# Patient Record
Sex: Female | Born: 1935 | Race: Black or African American | Hispanic: No | Marital: Single | Smoking: Never smoker
Health system: Southern US, Community
[De-identification: ages and names within clinical notes are randomized; demographics above are authoritative.]

## PROBLEM LIST (undated history)

## (undated) DIAGNOSIS — J189 Pneumonia, unspecified organism: Secondary | ICD-10-CM

## (undated) DIAGNOSIS — N390 Urinary tract infection, site not specified: Secondary | ICD-10-CM

## (undated) DIAGNOSIS — I2699 Other pulmonary embolism without acute cor pulmonale: Secondary | ICD-10-CM

## (undated) DIAGNOSIS — I10 Essential (primary) hypertension: Secondary | ICD-10-CM

## (undated) DIAGNOSIS — K219 Gastro-esophageal reflux disease without esophagitis: Secondary | ICD-10-CM

## (undated) DIAGNOSIS — R600 Localized edema: Secondary | ICD-10-CM

## (undated) DIAGNOSIS — J45909 Unspecified asthma, uncomplicated: Secondary | ICD-10-CM

## (undated) DIAGNOSIS — F0390 Unspecified dementia without behavioral disturbance: Secondary | ICD-10-CM

## (undated) DIAGNOSIS — I82409 Acute embolism and thrombosis of unspecified deep veins of unspecified lower extremity: Secondary | ICD-10-CM

## (undated) DIAGNOSIS — M1990 Unspecified osteoarthritis, unspecified site: Secondary | ICD-10-CM

## (undated) DIAGNOSIS — N200 Calculus of kidney: Secondary | ICD-10-CM

## (undated) HISTORY — PX: KNEE ARTHROSCOPY: SHX127

## (undated) HISTORY — DX: Localized edema: R60.0

## (undated) HISTORY — PX: CARPAL TUNNEL RELEASE: SHX101

## (undated) HISTORY — PX: CHOLECYSTECTOMY: SHX55

## (undated) HISTORY — PX: HIP FRACTURE SURGERY: SHX118

## (undated) HISTORY — PX: DILATION AND CURETTAGE OF UTERUS: SHX78

## (undated) HISTORY — PX: FRACTURE SURGERY: SHX138

## (undated) HISTORY — DX: Essential (primary) hypertension: I10

## (undated) HISTORY — DX: Other pulmonary embolism without acute cor pulmonale: I26.99

## (undated) HISTORY — DX: Acute embolism and thrombosis of unspecified deep veins of unspecified lower extremity: I82.409

## (undated) HISTORY — PX: LITHOTRIPSY: SUR834

## (undated) HISTORY — DX: Unspecified dementia, unspecified severity, without behavioral disturbance, psychotic disturbance, mood disturbance, and anxiety: F03.90

## (undated) HISTORY — DX: Gastro-esophageal reflux disease without esophagitis: K21.9

---

## 1999-02-16 ENCOUNTER — Emergency Department (HOSPITAL_COMMUNITY): Admission: EM | Admit: 1999-02-16 | Discharge: 1999-02-16 | Payer: Self-pay | Admitting: Emergency Medicine

## 1999-02-16 ENCOUNTER — Encounter: Payer: Self-pay | Admitting: Emergency Medicine

## 1999-02-21 ENCOUNTER — Ambulatory Visit (HOSPITAL_COMMUNITY): Admission: RE | Admit: 1999-02-21 | Discharge: 1999-02-21 | Payer: Self-pay | Admitting: Urology

## 1999-12-26 ENCOUNTER — Encounter: Payer: Self-pay | Admitting: Emergency Medicine

## 1999-12-26 ENCOUNTER — Emergency Department (HOSPITAL_COMMUNITY): Admission: EM | Admit: 1999-12-26 | Discharge: 1999-12-26 | Payer: Self-pay | Admitting: Emergency Medicine

## 2002-07-08 ENCOUNTER — Emergency Department (HOSPITAL_COMMUNITY): Admission: EM | Admit: 2002-07-08 | Discharge: 2002-07-09 | Payer: Self-pay | Admitting: Emergency Medicine

## 2002-07-09 ENCOUNTER — Encounter: Payer: Self-pay | Admitting: Emergency Medicine

## 2003-09-06 ENCOUNTER — Ambulatory Visit (HOSPITAL_COMMUNITY): Admission: RE | Admit: 2003-09-06 | Discharge: 2003-09-06 | Payer: Self-pay | Admitting: Gastroenterology

## 2003-09-06 ENCOUNTER — Encounter: Payer: Self-pay | Admitting: Gastroenterology

## 2003-11-01 ENCOUNTER — Emergency Department (HOSPITAL_COMMUNITY): Admission: EM | Admit: 2003-11-01 | Discharge: 2003-11-01 | Payer: Self-pay | Admitting: Emergency Medicine

## 2004-02-22 ENCOUNTER — Ambulatory Visit (HOSPITAL_COMMUNITY): Admission: RE | Admit: 2004-02-22 | Discharge: 2004-02-22 | Payer: Self-pay | Admitting: Gastroenterology

## 2004-04-03 ENCOUNTER — Emergency Department (HOSPITAL_COMMUNITY): Admission: EM | Admit: 2004-04-03 | Discharge: 2004-04-03 | Payer: Self-pay | Admitting: Emergency Medicine

## 2004-08-29 ENCOUNTER — Emergency Department (HOSPITAL_COMMUNITY): Admission: EM | Admit: 2004-08-29 | Discharge: 2004-08-29 | Payer: Self-pay | Admitting: *Deleted

## 2004-12-04 ENCOUNTER — Ambulatory Visit (HOSPITAL_COMMUNITY): Admission: RE | Admit: 2004-12-04 | Discharge: 2004-12-04 | Payer: Self-pay | Admitting: Allergy

## 2005-01-03 ENCOUNTER — Ambulatory Visit: Payer: Self-pay | Admitting: Family Medicine

## 2005-01-16 ENCOUNTER — Inpatient Hospital Stay (HOSPITAL_COMMUNITY): Admission: EM | Admit: 2005-01-16 | Discharge: 2005-01-19 | Payer: Self-pay | Admitting: *Deleted

## 2005-01-16 ENCOUNTER — Ambulatory Visit: Payer: Self-pay | Admitting: Internal Medicine

## 2005-01-24 ENCOUNTER — Ambulatory Visit: Payer: Self-pay | Admitting: Family Medicine

## 2005-03-19 ENCOUNTER — Ambulatory Visit: Payer: Self-pay | Admitting: Internal Medicine

## 2005-03-19 ENCOUNTER — Inpatient Hospital Stay (HOSPITAL_COMMUNITY): Admission: EM | Admit: 2005-03-19 | Discharge: 2005-03-22 | Payer: Self-pay | Admitting: Emergency Medicine

## 2005-07-28 ENCOUNTER — Ambulatory Visit: Payer: Self-pay | Admitting: Family Medicine

## 2005-08-22 ENCOUNTER — Ambulatory Visit: Payer: Self-pay | Admitting: Family Medicine

## 2005-09-15 ENCOUNTER — Ambulatory Visit: Payer: Self-pay | Admitting: Family Medicine

## 2005-11-18 ENCOUNTER — Ambulatory Visit: Payer: Self-pay | Admitting: Family Medicine

## 2006-01-27 ENCOUNTER — Ambulatory Visit: Payer: Self-pay | Admitting: Internal Medicine

## 2006-03-02 ENCOUNTER — Ambulatory Visit: Payer: Self-pay | Admitting: Pulmonary Disease

## 2006-03-25 ENCOUNTER — Ambulatory Visit: Payer: Self-pay | Admitting: Family Medicine

## 2006-03-30 ENCOUNTER — Ambulatory Visit: Payer: Self-pay | Admitting: Internal Medicine

## 2006-04-24 ENCOUNTER — Ambulatory Visit: Payer: Self-pay | Admitting: Internal Medicine

## 2006-05-01 ENCOUNTER — Ambulatory Visit: Payer: Self-pay | Admitting: Internal Medicine

## 2006-06-12 ENCOUNTER — Ambulatory Visit: Payer: Self-pay | Admitting: Internal Medicine

## 2006-06-26 ENCOUNTER — Ambulatory Visit: Payer: Self-pay | Admitting: Internal Medicine

## 2006-07-10 ENCOUNTER — Ambulatory Visit: Payer: Self-pay | Admitting: Internal Medicine

## 2006-08-26 ENCOUNTER — Ambulatory Visit: Payer: Self-pay | Admitting: Internal Medicine

## 2006-10-06 ENCOUNTER — Ambulatory Visit: Payer: Self-pay | Admitting: Family Medicine

## 2006-10-16 ENCOUNTER — Ambulatory Visit: Payer: Self-pay | Admitting: Pulmonary Disease

## 2006-10-26 ENCOUNTER — Ambulatory Visit: Payer: Self-pay | Admitting: Internal Medicine

## 2006-10-26 ENCOUNTER — Ambulatory Visit: Payer: Self-pay | Admitting: Family Medicine

## 2006-10-26 LAB — CONVERTED CEMR LAB
ALT: 16 units/L (ref 0–40)
Albumin: 3.4 g/dL — ABNORMAL LOW (ref 3.5–5.2)
Alkaline Phosphatase: 88 units/L (ref 39–117)
BUN: 9 mg/dL (ref 6–23)
CO2: 32 meq/L (ref 19–32)
Calcium: 9.9 mg/dL (ref 8.4–10.5)
GFR calc non Af Amer: 51 mL/min
HCT: 42.8 % (ref 36.0–46.0)
Hemoglobin: 13.9 g/dL (ref 12.0–15.0)
Potassium: 3.5 meq/L (ref 3.5–5.1)
TSH: 2.63 microintl units/mL (ref 0.35–5.50)
Total Bilirubin: 1.3 mg/dL — ABNORMAL HIGH (ref 0.3–1.2)
Total Protein: 6.5 g/dL (ref 6.0–8.3)
WBC: 11 10*3/uL — ABNORMAL HIGH (ref 4.5–10.5)

## 2006-11-10 ENCOUNTER — Ambulatory Visit: Payer: Self-pay | Admitting: Pulmonary Disease

## 2007-01-05 ENCOUNTER — Ambulatory Visit: Payer: Self-pay | Admitting: Internal Medicine

## 2007-01-22 ENCOUNTER — Ambulatory Visit: Payer: Self-pay | Admitting: Internal Medicine

## 2007-03-19 ENCOUNTER — Ambulatory Visit: Payer: Self-pay | Admitting: Internal Medicine

## 2007-09-30 DIAGNOSIS — I10 Essential (primary) hypertension: Secondary | ICD-10-CM | POA: Insufficient documentation

## 2007-09-30 DIAGNOSIS — J45909 Unspecified asthma, uncomplicated: Secondary | ICD-10-CM

## 2007-09-30 DIAGNOSIS — M810 Age-related osteoporosis without current pathological fracture: Secondary | ICD-10-CM | POA: Insufficient documentation

## 2007-09-30 DIAGNOSIS — J209 Acute bronchitis, unspecified: Secondary | ICD-10-CM

## 2007-11-08 ENCOUNTER — Ambulatory Visit: Payer: Self-pay | Admitting: Family Medicine

## 2007-11-08 DIAGNOSIS — Z87442 Personal history of urinary calculi: Secondary | ICD-10-CM | POA: Insufficient documentation

## 2007-11-08 DIAGNOSIS — Z8711 Personal history of peptic ulcer disease: Secondary | ICD-10-CM

## 2007-11-08 DIAGNOSIS — K589 Irritable bowel syndrome without diarrhea: Secondary | ICD-10-CM

## 2007-11-08 DIAGNOSIS — Z9189 Other specified personal risk factors, not elsewhere classified: Secondary | ICD-10-CM | POA: Insufficient documentation

## 2007-12-20 ENCOUNTER — Ambulatory Visit: Payer: Self-pay | Admitting: Family Medicine

## 2007-12-20 DIAGNOSIS — K219 Gastro-esophageal reflux disease without esophagitis: Secondary | ICD-10-CM

## 2007-12-20 DIAGNOSIS — F068 Other specified mental disorders due to known physiological condition: Secondary | ICD-10-CM | POA: Insufficient documentation

## 2008-01-21 ENCOUNTER — Telehealth: Payer: Self-pay | Admitting: Family Medicine

## 2008-01-24 ENCOUNTER — Observation Stay (HOSPITAL_COMMUNITY): Admission: EM | Admit: 2008-01-24 | Discharge: 2008-01-27 | Payer: Self-pay | Admitting: Emergency Medicine

## 2008-01-25 ENCOUNTER — Ambulatory Visit: Payer: Self-pay | Admitting: Internal Medicine

## 2008-02-03 ENCOUNTER — Telehealth: Payer: Self-pay | Admitting: Family Medicine

## 2008-02-11 ENCOUNTER — Ambulatory Visit: Payer: Self-pay | Admitting: Family Medicine

## 2008-02-17 ENCOUNTER — Ambulatory Visit: Payer: Self-pay | Admitting: Internal Medicine

## 2008-03-24 ENCOUNTER — Inpatient Hospital Stay (HOSPITAL_COMMUNITY): Admission: EM | Admit: 2008-03-24 | Discharge: 2008-03-28 | Payer: Self-pay | Admitting: Emergency Medicine

## 2008-03-24 ENCOUNTER — Telehealth: Payer: Self-pay | Admitting: Family Medicine

## 2008-03-24 ENCOUNTER — Ambulatory Visit: Payer: Self-pay | Admitting: Internal Medicine

## 2008-03-31 ENCOUNTER — Ambulatory Visit: Payer: Self-pay | Admitting: Family Medicine

## 2008-03-31 DIAGNOSIS — Z86718 Personal history of other venous thrombosis and embolism: Secondary | ICD-10-CM | POA: Insufficient documentation

## 2008-04-04 ENCOUNTER — Telehealth: Payer: Self-pay | Admitting: Family Medicine

## 2008-04-07 ENCOUNTER — Ambulatory Visit: Payer: Self-pay | Admitting: Family Medicine

## 2008-04-07 DIAGNOSIS — N3000 Acute cystitis without hematuria: Secondary | ICD-10-CM

## 2008-04-07 LAB — CONVERTED CEMR LAB
Bilirubin Urine: NEGATIVE
Glucose, Urine, Semiquant: NEGATIVE
Protein, U semiquant: NEGATIVE
Urobilinogen, UA: 0.2
WBC Urine, dipstick: NEGATIVE
pH: 6

## 2008-04-11 ENCOUNTER — Telehealth: Payer: Self-pay | Admitting: Family Medicine

## 2008-04-12 ENCOUNTER — Telehealth: Payer: Self-pay | Admitting: Family Medicine

## 2008-04-13 ENCOUNTER — Ambulatory Visit: Payer: Self-pay | Admitting: Family Medicine

## 2008-04-13 LAB — CONVERTED CEMR LAB: Prothrombin Time: 25 s

## 2008-07-08 ENCOUNTER — Emergency Department (HOSPITAL_COMMUNITY): Admission: EM | Admit: 2008-07-08 | Discharge: 2008-07-08 | Payer: Self-pay | Admitting: Emergency Medicine

## 2008-07-10 ENCOUNTER — Ambulatory Visit: Payer: Self-pay | Admitting: Family Medicine

## 2008-07-10 DIAGNOSIS — IMO0002 Reserved for concepts with insufficient information to code with codable children: Secondary | ICD-10-CM | POA: Insufficient documentation

## 2008-07-10 LAB — CONVERTED CEMR LAB
INR: 3.5
Prothrombin Time: 22.6 s

## 2008-07-28 ENCOUNTER — Ambulatory Visit: Payer: Self-pay | Admitting: Family Medicine

## 2008-08-21 ENCOUNTER — Ambulatory Visit: Payer: Self-pay | Admitting: Family Medicine

## 2008-08-21 DIAGNOSIS — I824Y9 Acute embolism and thrombosis of unspecified deep veins of unspecified proximal lower extremity: Secondary | ICD-10-CM

## 2008-08-21 DIAGNOSIS — R609 Edema, unspecified: Secondary | ICD-10-CM

## 2008-08-21 LAB — CONVERTED CEMR LAB
Bilirubin Urine: NEGATIVE
Ketones, urine, test strip: NEGATIVE
Nitrite: NEGATIVE
Protein, U semiquant: NEGATIVE
Urobilinogen, UA: 0.2

## 2008-08-22 ENCOUNTER — Encounter: Payer: Self-pay | Admitting: Family Medicine

## 2008-09-06 ENCOUNTER — Ambulatory Visit: Payer: Self-pay | Admitting: Family Medicine

## 2008-09-06 LAB — CONVERTED CEMR LAB
Bilirubin Urine: NEGATIVE
Glucose, Urine, Semiquant: NEGATIVE
Ketones, urine, test strip: NEGATIVE
Protein, U semiquant: NEGATIVE
Urobilinogen, UA: 0.2
pH: 6

## 2008-09-07 ENCOUNTER — Telehealth: Payer: Self-pay | Admitting: Family Medicine

## 2008-09-07 LAB — CONVERTED CEMR LAB
AST: 22 units/L (ref 0–37)
Albumin: 3.3 g/dL — ABNORMAL LOW (ref 3.5–5.2)
Alkaline Phosphatase: 59 units/L (ref 39–117)
BUN: 13 mg/dL (ref 6–23)
Basophils Relative: 0.9 % (ref 0.0–3.0)
Creatinine, Ser: 1 mg/dL (ref 0.4–1.2)
Eosinophils Relative: 3.9 % (ref 0.0–5.0)
GFR calc Af Amer: 69 mL/min
Glucose, Bld: 69 mg/dL — ABNORMAL LOW (ref 70–99)
HCT: 42.3 % (ref 36.0–46.0)
Hemoglobin: 14.1 g/dL (ref 12.0–15.0)
MCV: 86.3 fL (ref 78.0–100.0)
Monocytes Absolute: 0.8 10*3/uL (ref 0.1–1.0)
Monocytes Relative: 11.5 % (ref 3.0–12.0)
Platelets: 160 10*3/uL (ref 150–400)
Potassium: 2.9 meq/L — ABNORMAL LOW (ref 3.5–5.1)
Pro B Natriuretic peptide (BNP): 84 pg/mL (ref 0.0–100.0)
RBC: 4.9 M/uL (ref 3.87–5.11)
Total Protein: 6.6 g/dL (ref 6.0–8.3)
WBC: 7.3 10*3/uL (ref 4.5–10.5)

## 2008-09-12 ENCOUNTER — Encounter: Payer: Self-pay | Admitting: Family Medicine

## 2008-09-19 ENCOUNTER — Encounter: Payer: Self-pay | Admitting: Family Medicine

## 2008-09-22 ENCOUNTER — Ambulatory Visit: Payer: Self-pay | Admitting: Internal Medicine

## 2008-09-22 ENCOUNTER — Ambulatory Visit: Payer: Self-pay | Admitting: Family Medicine

## 2008-09-22 LAB — CONVERTED CEMR LAB: INR: 2.6

## 2008-10-20 ENCOUNTER — Ambulatory Visit: Payer: Self-pay | Admitting: Family Medicine

## 2008-11-17 ENCOUNTER — Ambulatory Visit: Payer: Self-pay | Admitting: Family Medicine

## 2008-11-17 LAB — CONVERTED CEMR LAB
INR: 2.4
Prothrombin Time: 19 s

## 2008-12-15 ENCOUNTER — Ambulatory Visit: Payer: Self-pay | Admitting: Family Medicine

## 2008-12-15 DIAGNOSIS — J811 Chronic pulmonary edema: Secondary | ICD-10-CM

## 2008-12-15 LAB — CONVERTED CEMR LAB
Glucose, Urine, Semiquant: NEGATIVE
INR: 1.7
Ketones, urine, test strip: NEGATIVE
Prothrombin Time: 15.9 s
Urobilinogen, UA: 0.2
pH: 5

## 2008-12-18 ENCOUNTER — Encounter: Payer: Self-pay | Admitting: Family Medicine

## 2008-12-18 LAB — CONVERTED CEMR LAB
ALT: 19 units/L (ref 0–35)
AST: 17 units/L (ref 0–37)
Bilirubin, Direct: 0.1 mg/dL (ref 0.0–0.3)
Chloride: 111 meq/L (ref 96–112)
Creatinine, Ser: 1.19 mg/dL (ref 0.40–1.20)
Indirect Bilirubin: 0.6 mg/dL (ref 0.0–0.9)
Platelets: 175 10*3/uL (ref 150–400)
Potassium: 3.7 meq/L (ref 3.5–5.3)
Pro B Natriuretic peptide (BNP): 43.2 pg/mL (ref 0.0–100.0)
RDW: 15.5 % (ref 11.5–15.5)
Sodium: 148 meq/L — ABNORMAL HIGH (ref 135–145)

## 2008-12-20 ENCOUNTER — Telehealth: Payer: Self-pay | Admitting: Family Medicine

## 2008-12-27 ENCOUNTER — Ambulatory Visit: Payer: Self-pay | Admitting: Cardiology

## 2008-12-27 LAB — CONVERTED CEMR LAB
CO2: 31 meq/L (ref 19–32)
Chloride: 103 meq/L (ref 96–112)
Creatinine, Ser: 1.3 mg/dL — ABNORMAL HIGH (ref 0.4–1.2)

## 2008-12-29 ENCOUNTER — Encounter: Payer: Self-pay | Admitting: Cardiology

## 2008-12-29 ENCOUNTER — Ambulatory Visit: Payer: Self-pay

## 2009-01-04 ENCOUNTER — Ambulatory Visit: Payer: Self-pay | Admitting: Cardiology

## 2009-01-12 ENCOUNTER — Ambulatory Visit: Payer: Self-pay | Admitting: Family Medicine

## 2009-01-12 LAB — CONVERTED CEMR LAB: Prothrombin Time: 19.9 s

## 2009-04-02 ENCOUNTER — Ambulatory Visit: Payer: Self-pay | Admitting: Family Medicine

## 2009-04-02 DIAGNOSIS — IMO0002 Reserved for concepts with insufficient information to code with codable children: Secondary | ICD-10-CM | POA: Insufficient documentation

## 2009-04-02 DIAGNOSIS — R1084 Generalized abdominal pain: Secondary | ICD-10-CM | POA: Insufficient documentation

## 2009-04-02 LAB — CONVERTED CEMR LAB: INR: 3.3

## 2009-04-03 ENCOUNTER — Telehealth: Payer: Self-pay | Admitting: Family Medicine

## 2009-04-03 ENCOUNTER — Emergency Department (HOSPITAL_COMMUNITY): Admission: EM | Admit: 2009-04-03 | Discharge: 2009-04-03 | Payer: Self-pay | Admitting: Emergency Medicine

## 2009-04-03 LAB — CONVERTED CEMR LAB
ALT: 33 units/L (ref 0–35)
AST: 35 units/L (ref 0–37)
Albumin: 3.6 g/dL (ref 3.5–5.2)
Alkaline Phosphatase: 76 units/L (ref 39–117)
Basophils Relative: 0.5 % (ref 0.0–3.0)
CO2: 23 meq/L (ref 19–32)
Calcium: 8.8 mg/dL (ref 8.4–10.5)
Eosinophils Relative: 3.8 % (ref 0.0–5.0)
GFR calc non Af Amer: 55.85 mL/min (ref >60)
Hemoglobin: 14.9 g/dL (ref 12.0–15.0)
Lymphocytes Relative: 10.3 % — ABNORMAL LOW (ref 12.0–46.0)
MCHC: 32.5 g/dL (ref 30.0–36.0)
Monocytes Relative: 0.7 % — ABNORMAL LOW (ref 3.0–12.0)
Neutro Abs: 9.7 10*3/uL — ABNORMAL HIGH (ref 1.4–7.7)
RBC: 5.29 M/uL — ABNORMAL HIGH (ref 3.87–5.11)
Sodium: 146 meq/L — ABNORMAL HIGH (ref 135–145)
Total Protein: 7.4 g/dL (ref 6.0–8.3)
WBC: 11.5 10*3/uL — ABNORMAL HIGH (ref 4.5–10.5)

## 2009-04-09 ENCOUNTER — Telehealth: Payer: Self-pay | Admitting: Family Medicine

## 2009-04-27 ENCOUNTER — Ambulatory Visit: Payer: Self-pay | Admitting: Family Medicine

## 2009-04-27 DIAGNOSIS — J309 Allergic rhinitis, unspecified: Secondary | ICD-10-CM | POA: Insufficient documentation

## 2009-04-27 DIAGNOSIS — H1045 Other chronic allergic conjunctivitis: Secondary | ICD-10-CM

## 2009-07-20 ENCOUNTER — Ambulatory Visit: Payer: Self-pay | Admitting: Internal Medicine

## 2009-08-24 ENCOUNTER — Ambulatory Visit: Payer: Self-pay | Admitting: Internal Medicine

## 2009-08-27 ENCOUNTER — Ambulatory Visit: Payer: Self-pay | Admitting: Family Medicine

## 2009-08-29 ENCOUNTER — Emergency Department (HOSPITAL_COMMUNITY): Admission: EM | Admit: 2009-08-29 | Discharge: 2009-08-29 | Payer: Self-pay | Admitting: Emergency Medicine

## 2009-08-29 ENCOUNTER — Telehealth: Payer: Self-pay | Admitting: Family Medicine

## 2009-08-31 ENCOUNTER — Ambulatory Visit: Payer: Self-pay | Admitting: Family Medicine

## 2009-09-04 ENCOUNTER — Ambulatory Visit: Payer: Self-pay | Admitting: Family Medicine

## 2009-09-04 LAB — CONVERTED CEMR LAB
BUN: 17 mg/dL (ref 6–23)
CO2: 27 meq/L (ref 19–32)
Chloride: 105 meq/L (ref 96–112)
Creatinine, Ser: 1.3 mg/dL — ABNORMAL HIGH (ref 0.4–1.2)
Potassium: 3.3 meq/L — ABNORMAL LOW (ref 3.5–5.1)

## 2009-09-05 ENCOUNTER — Ambulatory Visit: Payer: Self-pay | Admitting: Internal Medicine

## 2009-09-06 ENCOUNTER — Telehealth: Payer: Self-pay | Admitting: Family Medicine

## 2009-10-16 ENCOUNTER — Ambulatory Visit: Payer: Self-pay | Admitting: Family Medicine

## 2009-10-16 LAB — CONVERTED CEMR LAB: Prothrombin Time: 21.6 s

## 2010-01-03 ENCOUNTER — Ambulatory Visit: Payer: Self-pay | Admitting: Internal Medicine

## 2010-01-04 ENCOUNTER — Telehealth (INDEPENDENT_AMBULATORY_CARE_PROVIDER_SITE_OTHER): Payer: Self-pay | Admitting: *Deleted

## 2010-01-30 ENCOUNTER — Ambulatory Visit: Payer: Self-pay | Admitting: Family Medicine

## 2010-01-30 ENCOUNTER — Telehealth (INDEPENDENT_AMBULATORY_CARE_PROVIDER_SITE_OTHER): Payer: Self-pay | Admitting: *Deleted

## 2010-01-30 DIAGNOSIS — K644 Residual hemorrhoidal skin tags: Secondary | ICD-10-CM | POA: Insufficient documentation

## 2010-01-30 DIAGNOSIS — R3129 Other microscopic hematuria: Secondary | ICD-10-CM

## 2010-01-30 LAB — CONVERTED CEMR LAB
Bilirubin Urine: NEGATIVE
Ketones, urine, test strip: NEGATIVE
Nitrite: NEGATIVE
Urobilinogen, UA: 0.2

## 2010-01-31 ENCOUNTER — Encounter: Payer: Self-pay | Admitting: Family Medicine

## 2010-04-09 ENCOUNTER — Ambulatory Visit: Payer: Self-pay | Admitting: Family Medicine

## 2010-04-09 LAB — CONVERTED CEMR LAB
Glucose, Urine, Semiquant: NEGATIVE
INR: 1.2
Nitrite: NEGATIVE
Prothrombin Time: 13.5 s
Specific Gravity, Urine: <1.005
WBC Urine, dipstick: NEGATIVE
pH: 7.5

## 2010-04-22 ENCOUNTER — Encounter: Payer: Self-pay | Admitting: Family Medicine

## 2010-04-30 ENCOUNTER — Ambulatory Visit: Payer: Self-pay | Admitting: Family Medicine

## 2010-04-30 DIAGNOSIS — S300XXA Contusion of lower back and pelvis, initial encounter: Secondary | ICD-10-CM

## 2010-05-31 ENCOUNTER — Ambulatory Visit: Payer: Self-pay | Admitting: Family Medicine

## 2010-07-12 ENCOUNTER — Ambulatory Visit: Payer: Self-pay | Admitting: Family Medicine

## 2010-08-14 ENCOUNTER — Ambulatory Visit: Payer: Self-pay | Admitting: Family Medicine

## 2010-08-14 LAB — CONVERTED CEMR LAB
Bilirubin Urine: NEGATIVE
Ketones, urine, test strip: NEGATIVE
Nitrite: POSITIVE
Specific Gravity, Urine: >=1.03

## 2010-08-15 ENCOUNTER — Encounter: Payer: Self-pay | Admitting: Family Medicine

## 2010-10-11 ENCOUNTER — Encounter: Payer: Self-pay | Admitting: Family Medicine

## 2010-11-09 ENCOUNTER — Emergency Department (HOSPITAL_COMMUNITY)
Admission: EM | Admit: 2010-11-09 | Discharge: 2010-11-10 | Payer: Self-pay | Source: Home / Self Care | Admitting: Emergency Medicine

## 2010-12-26 ENCOUNTER — Other Ambulatory Visit: Payer: Self-pay | Admitting: Family Medicine

## 2010-12-26 ENCOUNTER — Ambulatory Visit
Admission: RE | Admit: 2010-12-26 | Discharge: 2010-12-26 | Payer: Self-pay | Source: Home / Self Care | Attending: Family Medicine | Admitting: Family Medicine

## 2010-12-26 LAB — CBC WITH DIFFERENTIAL/PLATELET
Basophils Absolute: 0 10*3/uL (ref 0.0–0.1)
Basophils Relative: 0.3 % (ref 0.0–3.0)
Eosinophils Absolute: 0.3 10*3/uL (ref 0.0–0.7)
HCT: 43.4 % (ref 36.0–46.0)
Hemoglobin: 13.9 g/dL (ref 12.0–15.0)
Lymphocytes Relative: 25.8 % (ref 12.0–46.0)
Lymphs Abs: 3.3 10*3/uL (ref 0.7–4.0)
MCHC: 32.1 g/dL (ref 30.0–36.0)
MCV: 88.6 fl (ref 78.0–100.0)
Monocytes Absolute: 1 10*3/uL (ref 0.1–1.0)
Neutro Abs: 8.2 10*3/uL — ABNORMAL HIGH (ref 1.4–7.7)
RDW: 15.8 % — ABNORMAL HIGH (ref 11.5–14.6)

## 2010-12-26 LAB — BASIC METABOLIC PANEL
CO2: 27 mEq/L (ref 19–32)
Calcium: 9.3 mg/dL (ref 8.4–10.5)
Chloride: 105 mEq/L (ref 96–112)
Glucose, Bld: 87 mg/dL (ref 70–99)
Sodium: 142 mEq/L (ref 135–145)

## 2010-12-26 LAB — HEPATIC FUNCTION PANEL
ALT: 18 U/L (ref 0–35)
Albumin: 3.4 g/dL — ABNORMAL LOW (ref 3.5–5.2)
Total Protein: 6.1 g/dL (ref 6.0–8.3)

## 2010-12-31 NOTE — Assessment & Plan Note (Signed)
Summary: ?rectal bleeding   Vital Signs:  Patient profile:   77 year old female Weight:      154 pounds Temp:     98.1 degrees F oral Pulse rate:   96 / minute  Vitals Entered By: Alfred Levins, CMA (January 30, 2010 1:55 PM) CC: hematuria   History of Present Illness: Here with her daughter for several days of noticing small amounts of bright red blood in her underwear. They have not seen any blood in the toilet bowl. She denies any pain when urinating or passing a BM. No other symptoms.   Allergies: No Known Drug Allergies  Past History:  Past Medical History: Reviewed history from 01/03/2010 and no changes required. Asthma    - On daily prednsione since 2009  Hypertension Osteoporosis GERD Dementia Pulmonary embolism, hx of 4-09, sees Dr. [name] leg edema Complex med regimen--med calendar August 24, 2009 (daughter  doesn't remember this ov January 03, 2010 )  Flu shot-- August 24, 2009  ECHO 12-2008 showed EF of 70%  Past Surgical History: Reviewed history from 11/08/2007 and no changes required. Cholecystectomy Litotripsy Rt knee surgery  Review of Systems  The patient denies anorexia, fever, weight loss, weight gain, vision loss, decreased hearing, hoarseness, chest pain, syncope, dyspnea on exertion, peripheral edema, prolonged cough, headaches, hemoptysis, abdominal pain, melena, severe indigestion/heartburn, hematuria, incontinence, genital sores, muscle weakness, suspicious skin lesions, transient blindness, difficulty walking, depression, unusual weight change, abnormal bleeding, enlarged lymph nodes, angioedema, breast masses, and testicular masses.    Physical Exam  General:  Well-developed,well-nourished,in no acute distress; alert,appropriate and cooperative throughout examination Abdomen:  Bowel sounds positive,abdomen soft and non-tender without masses, organomegaly or hernias noted. Rectal:  Several small external hemorrhoids are seen.  Normal  sphincter tone. No rectal masses or tenderness. Genitalia:  normal introitus and no external lesions.     Impression & Recommendations:  Problem # 1:  HEMORRHOIDS, EXTERNAL (ICD-455.3)  Problem # 2:  MICROSCOPIC HEMATURIA (ICD-599.72)  Orders: T-Culture, Urine ([id number])  Complete Medication List: 1)  Multivitamins Tabs (Multiple vitamin) .... Take one tab by mouth once daily 2)  Citracal Plus Tabs (Multiple minerals-vitamins) .... Take 1 tablet by mouth two times a day 3)  Singulair 10 Mg Tabs (Montelukast sodium) .... Take one tab by mouth once daily 4)  Namenda 10 Mg Tabs (Memantine hcl) .... Take one tab by mouth two times a day 5)  Pulmicort 0.5 Mg/78ml Susp (Budesonide (inhalation)) .... Use two times a day 6)  Flonase 50 Mcg/act Susp (Fluticasone propionate) .... Use two sprays in each nostril two times a day 7)  Prednisone 10 Mg Tabs (Prednisone) .... Take 1 tablet by mouth once a day or as directed for taper on bottom of med calendar 8)  Nexium 40 Mg Cpdr (Esomeprazole magnesium) .... Take  one 30-60 min before first meal of the day 9)  Tums E-x 750 750 Mg Chew (Calcium carbonate antacid) .... Three times a day with meals 10)  Coumadin 3 Mg Tabs (Warfarin sodium) .... Take as directed per coumadin clinic 11)  Furosemide 40 Mg Tabs (Furosemide) .... Take 1 tablet by mouth once a day 12)  Vitamin D 2000 Unit Tabs (Cholecalciferol) .... Take 1 tablet by mouth once a day 13)  Albuterol Sulfate (2.5 Mg/39ml) 0.083% Nebu (Albuterol sulfate) .Marland Kitchen.. 1 neb evey 4 hours if needed 14)  Mucinex Dm 30-600 Mg Tb12 (Dextromethorphan-guaifenesin) .Marland Kitchen.. 1-2 two times a day as needed 15)  Hydroxyzine Hcl 25 Mg  Tabs (Hydroxyzine hcl) .Marland Kitchen.. 1 by mouth every 4 hours as needed for itching 16)  Prednisone 10 Mg Tabs (Prednisone) .... Increase to 2 tabs daily until back to baseline, then back to 1 tab daily 17)  Klor-con 10 10 Meq Cr-tabs (Potassium chloride) .... 2 tabs once daily 18)  Triamcinolone  Acetonide 0.1 % Crea (Triamcinolone acetonide) .... Apply two times a day as needed 19)  Brovana 15 Mcg/83ml Nebu (Arformoterol tartrate) .... One in nebulizer twice daily with budesonide  Other Orders: UA Dipstick w/o Micro (manual) ([id number])  Patient Instructions: 1)  Most likely the source of this blood is the hemorrhoids, so I suggested using Preparation H. Continue on Miralax daily. Will culture the urine.     Laboratory Results   Urine Tests  Date/Time Received: January 30, 2010 1:55 PM Date/Time Reported: January 30, 2010 1:55 PM  Routine Urinalysis   Color: yellow Appearance: Clear Glucose: negative   (Normal Range: Negative) Bilirubin: negative   (Normal Range: Negative) Ketone: negative   (Normal Range: Negative) Spec. Gravity: >=1.030   (Normal Range: 1.003-1.035) Blood: large   (Normal Range: Negative) pH: 5.0   (Normal Range: 5.0-8.0) Protein: negative   (Normal Range: Negative) Urobilinogen: 0.2   (Normal Range: 0-1) Nitrite: negative   (Normal Range: Negative) Leukocyte Esterace: negative   (Normal Range: Negative)    Comments: Alfred Levins, CMA  January 30, 2010 2:10 PM     Appended Document: ?rectal bleeding  Laboratory Results   Blood Tests     PT: 24.4 s   (Normal Range: 10.6-13.4)  INR: 4.0   (Normal Range: 0.88-1.12   Therap INR: 2.0-3.5) Comments: Rita Ohara  January 30, 2010 2:59 PM       ANTICOAGULATION RECORD PREVIOUS REGIMEN & LAB RESULTS Anticoagulation Diagnosis:  dvt,v58.83,v58.91 on  04/13/2008 Previous INR Goal Range:  2.0-3.0 on  04/13/2008 Previous INR:  3.2 on  10/16/2009 Previous Coumadin Dose(mg):  3mg  on m,f. 2mg  other days on  09/22/2008 Previous Regimen:  same on  10/16/2009 Previous Coagulation Comments:  OV on  09/22/2008  NEW REGIMEN & LAB RESULTS Current INR: 4.0 Regimen: 3mg  M, F others 2mg .  Repeat testing in: 2 weeks  Anticoagulation Visit Questionnaire Coumadin dose missed/changed:  No Abnormal Bleeding  Symptoms:  No  Any diet changes including alcohol intake, vegetables or greens since the last visit:  No Any illnesses or hospitalizations since the last visit:  No Any signs of clotting since the last visit (including chest discomfort, dizziness, shortness of breath, arm tingling, slurred speech, swelling or redness in leg):  No  MEDICATIONS MULTIVITAMINS   TABS (MULTIPLE VITAMIN) take one tab by mouth once daily CITRACAL PLUS  TABS (MULTIPLE MINERALS-VITAMINS) Take 1 tablet by mouth two times a day SINGULAIR 10 MG  TABS (MONTELUKAST SODIUM) take one tab by mouth once daily NAMENDA 10 MG  TABS (MEMANTINE HCL) take one tab by mouth two times a day PULMICORT 0.5 MG/2ML  SUSP (BUDESONIDE (INHALATION)) use two times a day FLONASE 50 MCG/ACT  SUSP (FLUTICASONE PROPIONATE) use two sprays in each nostril two times a day PREDNISONE 10 MG  TABS (PREDNISONE) Take 1 tablet by mouth once a day or as directed for taper on bottom of med calendar NEXIUM 40 MG  CPDR (ESOMEPRAZOLE MAGNESIUM) Take  one 30-60 min before first meal of the day TUMS E-X 750 750 MG CHEW (CALCIUM CARBONATE ANTACID) three times a day with meals COUMADIN 3 MG  TABS (WARFARIN SODIUM)  take as directed per coumadin clinic FUROSEMIDE 40 MG TABS (FUROSEMIDE) Take 1 tablet by mouth once a day VITAMIN D 2000 UNIT TABS (CHOLECALCIFEROL) Take 1 tablet by mouth once a day ALBUTEROL SULFATE (2.5 MG/3ML) 0.083%  NEBU (ALBUTEROL SULFATE) 1 neb evey 4 hours if needed MUCINEX DM 30-600 MG  TB12 (DEXTROMETHORPHAN-GUAIFENESIN) 1-2 two times a day as needed HYDROXYZINE HCL 25 MG TABS (HYDROXYZINE HCL) 1 by mouth every 4 hours as needed for itching PREDNISONE 10 MG TABS (PREDNISONE) increase to 2 tabs daily until back to baseline, then back to 1 tab daily KLOR-CON 10 10 MEQ CR-TABS (POTASSIUM CHLORIDE) 2 tabs once daily TRIAMCINOLONE ACETONIDE 0.1 % CREA (TRIAMCINOLONE ACETONIDE) apply two times a day as needed BROVANA 15 MCG/2ML  NEBU (ARFORMOTEROL  TARTRATE) One in nebulizer twice daily with budesonide

## 2010-12-31 NOTE — Progress Notes (Signed)
Summary: ?rectal bleeding  Phone Note Call from Patient   Caller: Daughter, [name] Call For: Nelwyn Salisbury MD Summary of Call: daughter has seen small amount dark blood in panty briefs last 2 days- no pain. Initial call taken by: Raechel Ache, RN,  January 30, 2010 10:40 AM  Follow-up for Phone Call        appt Dr Clent Ridges 2pm today. Follow-up by: Raechel Ache, RN,  January 30, 2010 10:40 AM

## 2010-12-31 NOTE — Progress Notes (Signed)
Summary: brovana not covered - pharmacy to file under part B.  Phone Note Outgoing Call   Call placed by: Boone Master CNA,  January 04, 2010 5:22 PM Call placed to: Patient Summary of Call: called, spoke with pt's daughter [name].  informed her that we received a fax from [facility] stating that pt's brovana is not covered under her insurance.  i informed [name] that the neb solutions should be filed under part B rather than part D as they always are, but that the pharmacy does not have any of the pt's part B information on file.  [name] stated that she will take her mother's part B card to the pharmacy tomorrow.  i encouraged her to have the pharmacy contact our office if there are any issues regarding this.  Kim verbalized her understanding.  will sign off on this phone note. Initial call taken by: Boone Master CNA,  January 04, 2010 5:26 PM

## 2010-12-31 NOTE — Assessment & Plan Note (Signed)
Summary: sluggish feeling//ccm [facility] DUE TO TRANSPORTATION/[facility]   Vital Signs:  Patient profile:   77 year old female Weight:      153 pounds O2 Sat:      93 % Temp:     98.3 degrees F Pulse rate:   90 / minute BP sitting:   120 / 84  (left arm) Cuff size:   regular  Vitals Entered By: Pura Spice, RN (August 14, 2010 3:31 PM) CC: ? UTI going to [facility] frequently  eyes per family member "pus looking" refill azelastine eye gtt Is Patient Diabetic? No   History of Present Illness: here with her daughter for a week of increased sleepiness and sluggishness, also some fevers. No complaints of pain, no NVD. She drinks plenty of fluids.   Allergies (verified): No Known Drug Allergies  Past History:  Past Medical History: Reviewed history from 04/09/2010 and no changes required. Asthma, sees Dr. [name]    - On daily prednsione since 2009  Hypertension Osteoporosis GERD Dementia Pulmonary embolism, hx of 4-09, sees Dr. [name] leg edema Complex med regimen--med calendar August 24, 2009 (daughter  doesn't remember this ov January 03, 2010 )  Flu shot-- August 24, 2009  ECHO 12-2008 showed EF of 70%  Review of Systems  The patient denies anorexia, weight loss, weight gain, vision loss, decreased hearing, hoarseness, chest pain, syncope, dyspnea on exertion, peripheral edema, prolonged cough, headaches, hemoptysis, abdominal pain, melena, hematochezia, severe indigestion/heartburn, hematuria, incontinence, genital sores, muscle weakness, suspicious skin lesions, transient blindness, difficulty walking, depression, unusual weight change, abnormal bleeding, enlarged lymph nodes, angioedema, breast masses, and testicular masses.    Physical Exam  General:  Well-developed,well-nourished,in no acute distress; alert,appropriate and cooperative throughout examination Neck:  No deformities, masses, or tenderness noted. Lungs:  Normal respiratory effort, chest expands symmetrically.  Lungs are clear to auscultation, no crackles or wheezes. Heart:  Normal rate and regular rhythm. S1 and S2 normal without gallop, murmur, click, rub or other extra sounds. Abdomen:  Bowel sounds positive,abdomen soft and non-tender without masses, organomegaly or hernias noted.   Impression & Recommendations:  Problem # 1:  UTI (ICD-599.0)  Her updated medication list for this problem includes:    Cipro 500 Mg Tabs (Ciprofloxacin hcl) .Marland Kitchen..Marland Kitchen Two times a day  Orders: UA Dipstick w/o Micro (manual) ([id number]) T-Culture, Urine ([id number])  Complete Medication List: 1)  Multivitamins Tabs (Multiple vitamin) .... Take one tab by mouth once daily 2)  Citracal Plus Tabs (Multiple minerals-vitamins) .... Take 1 tablet by mouth two times a day 3)  Singulair 10 Mg Tabs (Montelukast sodium) .... Take one tab by mouth once daily 4)  Namenda 10 Mg Tabs (Memantine hcl) .... Take one tab by mouth two times a day 5)  Pulmicort 0.5 Mg/69ml Susp (Budesonide (inhalation)) .... Use two times a day 6)  Flonase 50 Mcg/act Susp (Fluticasone propionate) .... Use two sprays in each nostril two times a day 7)  Prednisone 10 Mg Tabs (Prednisone) .... Take 1 tablet by mouth once a day or as directed for taper on bottom of med calendar 8)  Nexium 40 Mg Cpdr (Esomeprazole magnesium) .... Take  one 30-60 min before first meal of the day 9)  Tums E-x 750 750 Mg Chew (Calcium carbonate antacid) .... Three times a day with meals 10)  Coumadin 3 Mg Tabs (Warfarin sodium) .... Take as directed per coumadin clinic 11)  Furosemide 40 Mg Tabs (Furosemide) .... Take 1 tablet by mouth once a  day 12)  Vitamin D 2000 Unit Tabs (Cholecalciferol) .... Take 1 tablet by mouth once a day 13)  Albuterol Sulfate (2.5 Mg/42ml) 0.083% Nebu (Albuterol sulfate) .Marland Kitchen.. 1 neb evey 4 hours if needed 14)  Mucinex Dm 30-600 Mg Tb12 (Dextromethorphan-guaifenesin) .Marland Kitchen.. 1-2 two times a day as needed 15)  Hydroxyzine Hcl 25 Mg Tabs (Hydroxyzine hcl)  .Marland Kitchen.. 1 by mouth every 4 hours as needed for itching 16)  Prednisone 10 Mg Tabs (Prednisone) .... Increase to 2 tabs daily until back to baseline, then back to 1 tab daily 17)  Klor-con 10 10 Meq Cr-tabs (Potassium chloride) .... 2 tabs once daily 18)  Triamcinolone Acetonide 0.1 % Crea (Triamcinolone acetonide) .... Apply two times a day as needed 19)  Brovana 15 Mcg/69ml Nebu (Arformoterol tartrate) .... One in nebulizer twice daily with budesonide 20)  Azelastine Hcl 0.05 % Soln (Azelastine hcl) .Marland Kitchen.. 1 gtt in each eye two times a day 21)  Cipro 500 Mg Tabs (Ciprofloxacin hcl) .... Two times a day  Patient Instructions: 1)  Await the urine culture result.  Prescriptions: CIPRO 500 MG TABS (CIPROFLOXACIN HCL) two times a day  #20 x 0   Entered and Authorized by:   Nelwyn Salisbury MD   Signed by:   Nelwyn Salisbury MD on 08/14/2010   Method used:   Elect[facility]ically to        [facility]  [phone]* (retail)       [address] [place], [place]  [address]       Ph: [phone] or [phone]       Fax: [phone]   RxID:   [id number] AZELASTINE HCL 0.05 % SOLN (AZELASTINE HCL) 1 gtt in each eye two times a day  #10 x 11   Entered and Authorized by:   Nelwyn Salisbury MD   Signed by:   Nelwyn Salisbury MD on 08/14/2010   Method used:   [facility]        [facility]  [address]  [phone][facility] (retail)       [address] [place], [place]  [address]       Ph: [phone] or [phone]       Fax: [phone]   RxID:   [id number]   Laboratory Results   Urine Tests  Date/Time Received: August 14, 2010  Date/Time Reported: Pura Spice, RN  August 14, 2010 3:33 PM   Routine Urinalysis   Color: orange Appearance: Hazy Glucose: negative   (Normal Range: Negative) Bilirubin: negative   (Normal Range: Negative) Ketone: negative   (Normal Range: Negative) Spec. Gravity: >=1.030   (Normal Range: 1.003-1.035) Blood: large   (Normal Range: Negative) pH: 5.0    (Normal Range: 5.0-8.0) Protein: trace   (Normal Range: Negative) Urobilinogen: 0.2   (Normal Range: 0-1) Nitrite: positive   (Normal Range: Negative) Leukocyte Esterace: small   (Normal Range: Negative)    Comments: Pura Spice, RN  August 14, 2010 3:35 PM      Appended Document: Orders Update     Clinical Lists Changes  Orders: Added new Service order of Protime ([id number]) - Signed Added new Service order of Fingerstick (15176) - Signed Added new Service order of Specimen Handling (16073) - Signed Observations: Added new observation of ABNORM BLEED: No (08/14/2010 16:08) Added new observation of COUMADIN CHG: No (08/14/2010 16:08) Added new observation of NEXT PT: 4 weeks (08/14/2010 16:08) Added new  observation of COMMENTS2: Wynona Canes, CMA  August 14, 2010 4:10 PM  (08/14/2010 16:08) Added new observation of INR: 2.6  (08/14/2010 16:08)      Laboratory Results   Blood Tests   Date/Time Recieved: August 14, 2010 4:10 PM  Date/Time Reported: August 14, 2010 4:10 PM    INR: 2.6   (Normal Range: 0.88-1.12   Therap INR: 2.0-3.5) Comments: Wynona Canes, CMA  August 14, 2010 4:10 PM       ANTICOAGULATION RECORD PREVIOUS REGIMEN & LAB RESULTS Anticoagulation Diagnosis:  dvt,v58.83,v58.91 on  04/13/2008 Previous INR Goal Range:  2.0-3.0 on  04/13/2008 Previous INR:  2.6 on  07/12/2010 Previous Coumadin Dose(mg):  3mg  on m,w,f 2mg  on other days on  05/31/2010 Previous Regimen:  same on  04/30/2010 Previous Coagulation Comments:  OV on  09/22/2008  NEW REGIMEN & LAB RESULTS Current INR: 2.6 Regimen: same  (no change)       Repeat testing in: 4 weeks MEDICATIONS MULTIVITAMINS   TABS (MULTIPLE VITAMIN) take one tab by mouth once daily CITRACAL PLUS  TABS (MULTIPLE MINERALS-VITAMINS) Take 1 tablet by mouth two times a day SINGULAIR 10 MG  TABS (MONTELUKAST SODIUM) take one tab by mouth once daily NAMENDA 10 MG  TABS (MEMANTINE HCL)  take one tab by mouth two times a day PULMICORT 0.5 MG/2ML  SUSP (BUDESONIDE (INHALATION)) use two times a day FLONASE 50 MCG/ACT  SUSP (FLUTICASONE PROPIONATE) use two sprays in each nostril two times a day PREDNISONE 10 MG  TABS (PREDNISONE) Take 1 tablet by mouth once a day or as directed for taper on bottom of med calendar NEXIUM 40 MG  CPDR (ESOMEPRAZOLE MAGNESIUM) Take  one 30-60 min before first meal of the day TUMS E-X 750 750 MG CHEW (CALCIUM CARBONATE ANTACID) three times a day with meals COUMADIN 3 MG  TABS (WARFARIN SODIUM) take as directed per coumadin clinic FUROSEMIDE 40 MG TABS (FUROSEMIDE) Take 1 tablet by mouth once a day VITAMIN D 2000 UNIT TABS (CHOLECALCIFEROL) Take 1 tablet by mouth once a day ALBUTEROL SULFATE (2.5 MG/3ML) 0.083%  NEBU (ALBUTEROL SULFATE) 1 neb evey 4 hours if needed MUCINEX DM 30-600 MG  TB12 (DEXTROMETHORPHAN-GUAIFENESIN) 1-2 two times a day as needed HYDROXYZINE HCL 25 MG TABS (HYDROXYZINE HCL) 1 by mouth every 4 hours as needed for itching PREDNISONE 10 MG TABS (PREDNISONE) increase to 2 tabs daily until back to baseline, then back to 1 tab daily KLOR-CON 10 10 MEQ CR-TABS (POTASSIUM CHLORIDE) 2 tabs once daily TRIAMCINOLONE ACETONIDE 0.1 % CREA (TRIAMCINOLONE ACETONIDE) apply two times a day as needed BROVANA 15 MCG/2ML  NEBU (ARFORMOTEROL TARTRATE) One in nebulizer twice daily with budesonide AZELASTINE HCL 0.05 % SOLN (AZELASTINE HCL) 1 gtt in each eye two times a day CIPRO 500 MG TABS (CIPROFLOXACIN HCL) two times a day   Anticoagulation Visit Questionnaire      Coumadin dose missed/changed:  No      Abnormal Bleeding Symptoms:  No   Any diet changes including alcohol intake, vegetables or greens since the last visit:  No Any illnesses or hospitalizations since the last visit:  No Any signs of clotting since the last visit (including chest discomfort, dizziness, shortness of breath, arm tingling, slurred speech, swelling or redness in leg):   No

## 2010-12-31 NOTE — Assessment & Plan Note (Signed)
Summary: PT/RCD   Nurse Visit   Allergies: No Known Drug Allergies Laboratory Results   Blood Tests   Date/Time Received: May 31, 2010 1:03 PM  Date/Time Reported: May 31, 2010 1:03 PM    INR: 3.3   (Normal Range: 0.88-1.12   Therap INR: 2.0-3.5) Comments: Wynona Canes, CMA  May 31, 2010 1:03 PM      Orders Added: 1)  Est. Patient Level I [99211] 2)  Protime [[id number]]  Laboratory Results   Blood Tests      INR: 3.3   (Normal Range: 0.88-1.12   Therap INR: 2.0-3.5) Comments: Wynona Canes, CMA  May 31, 2010 1:03 PM        ANTICOAGULATION RECORD PREVIOUS REGIMEN & LAB RESULTS Anticoagulation Diagnosis:  dvt,v58.83,v58.91 on  04/13/2008 Previous INR Goal Range:  2.0-3.0 on  04/13/2008 Previous INR:  3.5 on  04/30/2010 Previous Coumadin Dose(mg):  3mg  on m,f. 2mg  other days on  09/22/2008 Previous Regimen:  same on  04/30/2010 Previous Coagulation Comments:  OV on  09/22/2008  NEW REGIMEN & LAB RESULTS Current INR: 3.3 Current Coumadin Dose(mg): 3mg  on m,w,f 2mg  on other days Regimen: same  (no change)       Repeat testing in: 4 weeks MEDICATIONS MULTIVITAMINS   TABS (MULTIPLE VITAMIN) take one tab by mouth once daily CITRACAL PLUS  TABS (MULTIPLE MINERALS-VITAMINS) Take 1 tablet by mouth two times a day SINGULAIR 10 MG  TABS (MONTELUKAST SODIUM) take one tab by mouth once daily NAMENDA 10 MG  TABS (MEMANTINE HCL) take one tab by mouth two times a day PULMICORT 0.5 MG/2ML  SUSP (BUDESONIDE (INHALATION)) use two times a day FLONASE 50 MCG/ACT  SUSP (FLUTICASONE PROPIONATE) use two sprays in each nostril two times a day PREDNISONE 10 MG  TABS (PREDNISONE) Take 1 tablet by mouth once a day or as directed for taper on bottom of med calendar NEXIUM 40 MG  CPDR (ESOMEPRAZOLE MAGNESIUM) Take  one 30-60 min before first meal of the day TUMS E-X 750 750 MG CHEW (CALCIUM CARBONATE ANTACID) three times a day with meals COUMADIN 3 MG  TABS (WARFARIN  SODIUM) take as directed per coumadin clinic FUROSEMIDE 40 MG TABS (FUROSEMIDE) Take 1 tablet by mouth once a day VITAMIN D 2000 UNIT TABS (CHOLECALCIFEROL) Take 1 tablet by mouth once a day ALBUTEROL SULFATE (2.5 MG/3ML) 0.083%  NEBU (ALBUTEROL SULFATE) 1 neb evey 4 hours if needed MUCINEX DM 30-600 MG  TB12 (DEXTROMETHORPHAN-GUAIFENESIN) 1-2 two times a day as needed HYDROXYZINE HCL 25 MG TABS (HYDROXYZINE HCL) 1 by mouth every 4 hours as needed for itching PREDNISONE 10 MG TABS (PREDNISONE) increase to 2 tabs daily until back to baseline, then back to 1 tab daily KLOR-CON 10 10 MEQ CR-TABS (POTASSIUM CHLORIDE) 2 tabs once daily TRIAMCINOLONE ACETONIDE 0.1 % CREA (TRIAMCINOLONE ACETONIDE) apply two times a day as needed BROVANA 15 MCG/2ML  NEBU (ARFORMOTEROL TARTRATE) One in nebulizer twice daily with budesonide AZELASTINE HCL 0.05 % SOLN (AZELASTINE HCL) 1 gtt in each eye two times a day   Anticoagulation Visit Questionnaire      Coumadin dose missed/changed:  No      Abnormal Bleeding Symptoms:  No   Any diet changes including alcohol intake, vegetables or greens since the last visit:  No Any illnesses or hospitalizations since the last visit:  No Any signs of clotting since the last visit (including chest discomfort, dizziness, shortness of breath, arm tingling, slurred speech, swelling or  redness in leg):  No

## 2010-12-31 NOTE — Letter (Signed)
Summary: Medical Necessity for Underpads/[facility]  Medical Necessity for Underpads/[facility]   Imported By: Maryln Gottron 10/16/2010 14:31:39  _____________________________________________________________________  External Attachment:    Type:   Image     Comment:   External Document

## 2010-12-31 NOTE — Letter (Signed)
Summary: Medical Necessity for Incontinence Supplies  Medical Necessity for Incontinence Supplies   Imported By: Maryln Gottron 05/03/2010 14:50:47  _____________________________________________________________________  External Attachment:    Type:   Image     Comment:   External Document

## 2010-12-31 NOTE — Assessment & Plan Note (Signed)
Summary: pts face is swollen/then go to pt/cjr   Vital Signs:  Patient profile:   77 year old female Weight:      153 pounds O2 Sat:      94 % on Room air Pulse rate:   78 / minute Pulse rhythm:   regular BP sitting:   130 / 88  (left arm) Cuff size:   regular  Vitals Entered By: Raechel Ache, RN (Apr 09, 2010 3:50 PM)  O2 Flow:  Room air CC: Daughter states face is swollen and is breathing hard.   History of Present Illness: Here with her daughter for several days of mild swelling in the legs and mild puffiness in the face. She does not seem to be any more SOB than usual. Really no other symptomatic changes at all.   Allergies: No Known Drug Allergies  Past History:  Past Medical History: Asthma, sees Dr. [name]    - On daily prednsione since 2009  Hypertension Osteoporosis GERD Dementia Pulmonary embolism, hx of 4-09, sees Dr. [name] leg edema Complex med regimen--med calendar August 24, 2009 (daughter  doesn't remember this ov January 03, 2010 )  Flu shot-- August 24, 2009  ECHO 12-2008 showed EF of 70%  Past Surgical History: Cholecystectomy Lithotripsy Rt knee surgery  Review of Systems  The patient denies anorexia, fever, weight loss, weight gain, vision loss, decreased hearing, hoarseness, chest pain, syncope, peripheral edema, prolonged cough, headaches, hemoptysis, abdominal pain, melena, hematochezia, severe indigestion/heartburn, hematuria, incontinence, genital sores, muscle weakness, suspicious skin lesions, transient blindness, difficulty walking, depression, unusual weight change, abnormal bleeding, enlarged lymph nodes, angioedema, breast masses, and testicular masses.    Physical Exam  General:  Well-developed,well-nourished,in no acute distress; alert,appropriate and cooperative throughout examination. I do not appreciate any swelling in the face Neck:  No deformities, masses, or tenderness noted. Lungs:  Normal respiratory effort, chest  expands symmetrically. Lungs are clear to auscultation, no crackles or wheezes. Heart:  Normal rate and regular rhythm. S1 and S2 normal without gallop, murmur, click, rub or other extra sounds. Extremities:  trace left pedal edema and trace right pedal edema.     Impression & Recommendations:  Problem # 1:  PULMONARY EDEMA (ICD-514)  Problem # 2:  LEG EDEMA (ICD-782.3)  Her updated medication list for this problem includes:    Furosemide 40 Mg Tabs (Furosemide) .Marland Kitchen... Take 1 tablet by mouth once a day  Problem # 3:  COUMADIN THERAPY (ICD-V58.61)  Orders: Protime ([id number]) Fingerstick (82956)  Problem # 4:  DEMENTIA (ICD-294.8)  Problem # 5:  HYPERTENSION (ICD-401.9)  Her updated medication list for this problem includes:    Furosemide 40 Mg Tabs (Furosemide) .Marland Kitchen... Take 1 tablet by mouth once a day  Problem # 6:  ASTHMA (ICD-493.90)  Her updated medication list for this problem includes:    Singulair 10 Mg Tabs (Montelukast sodium) .Marland Kitchen... Take one tab by mouth once daily    Pulmicort 0.5 Mg/44ml Susp (Budesonide (inhalation)) ..... Use two times a day    Prednisone 10 Mg Tabs (Prednisone) .Marland Kitchen... Take 1 tablet by mouth once a day or as directed for taper on bottom of med calendar    Albuterol Sulfate (2.5 Mg/6ml) 0.083% Nebu (Albuterol sulfate) .Marland Kitchen... 1 neb evey 4 hours if needed    Prednisone 10 Mg Tabs (Prednisone) ..... Increase to 2 tabs daily until back to baseline, then back to 1 tab daily    Brovana 15 Mcg/30ml Nebu (Arformoterol tartrate) ..... One in  nebulizer twice daily with budesonide  Complete Medication List: 1)  Multivitamins Tabs (Multiple vitamin) .... Take one tab by mouth once daily 2)  Citracal Plus Tabs (Multiple minerals-vitamins) .... Take 1 tablet by mouth two times a day 3)  Singulair 10 Mg Tabs (Montelukast sodium) .... Take one tab by mouth once daily 4)  Namenda 10 Mg Tabs (Memantine hcl) .... Take one tab by mouth two times a day 5)  Pulmicort 0.5  Mg/52ml Susp (Budesonide (inhalation)) .... Use two times a day 6)  Flonase 50 Mcg/act Susp (Fluticasone propionate) .... Use two sprays in each nostril two times a day 7)  Prednisone 10 Mg Tabs (Prednisone) .... Take 1 tablet by mouth once a day or as directed for taper on bottom of med calendar 8)  Nexium 40 Mg Cpdr (Esomeprazole magnesium) .... Take  one 30-60 min before first meal of the day 9)  Tums E-x 750 750 Mg Chew (Calcium carbonate antacid) .... Three times a day with meals 10)  Coumadin 3 Mg Tabs (Warfarin sodium) .... Take as directed per coumadin clinic 11)  Furosemide 40 Mg Tabs (Furosemide) .... Take 1 tablet by mouth once a day 12)  Vitamin D 2000 Unit Tabs (Cholecalciferol) .... Take 1 tablet by mouth once a day 13)  Albuterol Sulfate (2.5 Mg/72ml) 0.083% Nebu (Albuterol sulfate) .Marland Kitchen.. 1 neb evey 4 hours if needed 14)  Mucinex Dm 30-600 Mg Tb12 (Dextromethorphan-guaifenesin) .Marland Kitchen.. 1-2 two times a day as needed 15)  Hydroxyzine Hcl 25 Mg Tabs (Hydroxyzine hcl) .Marland Kitchen.. 1 by mouth every 4 hours as needed for itching 16)  Prednisone 10 Mg Tabs (Prednisone) .... Increase to 2 tabs daily until back to baseline, then back to 1 tab daily 17)  Klor-con 10 10 Meq Cr-tabs (Potassium chloride) .... 2 tabs once daily 18)  Triamcinolone Acetonide 0.1 % Crea (Triamcinolone acetonide) .... Apply two times a day as needed 19)  Brovana 15 Mcg/53ml Nebu (Arformoterol tartrate) .... One in nebulizer twice daily with budesonide  Other Orders: UA Dipstick w/o Micro (manual) (16109)  Patient Instructions: 1)  Continue current meds.  2)  Please schedule a follow-up appointment as needed .   Laboratory Results   Urine Tests    Routine Urinalysis   Color: straw Appearance: Clear Glucose: negative   (Normal Range: Negative) Bilirubin: negative   (Normal Range: Negative) Ketone: negative   (Normal Range: Negative) Spec. Gravity: <1.005   (Normal Range: 1.003-1.035) Blood: trace-lysed   (Normal  Range: Negative) pH: 7.5   (Normal Range: 5.0-8.0) Protein: negative   (Normal Range: Negative) Urobilinogen: negative   (Normal Range: 0-1) Nitrite: negative   (Normal Range: Negative) Leukocyte Esterace: negative   (Normal Range: Negative)     Blood Tests   Date/Time Recieved: Apr 09, 2010 4:43 PM  Date/Time Reported: Apr 09, 2010 4:43 PM   PT: 13.5 s   (Normal Range: 10.6-13.4)  INR: 1.2   (Normal Range: 0.88-1.12   Therap INR: 2.0-3.5) Comments: Wynona Canes, CMA  Apr 09, 2010 4:43 PM       ANTICOAGULATION RECORD PREVIOUS REGIMEN & LAB RESULTS Anticoagulation Diagnosis:  dvt,v58.83,v58.91 on  04/13/2008 Previous INR Goal Range:  2.0-3.0 on  04/13/2008 Previous INR:  4.0 on  01/30/2010 Previous Coumadin Dose(mg):  3mg  on m,f. 2mg  other days on  09/22/2008 Previous Regimen:  3mg  M, F others 2mg . on  01/30/2010 Previous Coagulation Comments:  OV on  09/22/2008  NEW REGIMEN & LAB RESULTS Current INR: 1.2 Regimen: 3mg   on m,w,f 2mg  on other days       Repeat testing in: 2 weeks MEDICATIONS MULTIVITAMINS   TABS (MULTIPLE VITAMIN) take one tab by mouth once daily CITRACAL PLUS  TABS (MULTIPLE MINERALS-VITAMINS) Take 1 tablet by mouth two times a day SINGULAIR 10 MG  TABS (MONTELUKAST SODIUM) take one tab by mouth once daily NAMENDA 10 MG  TABS (MEMANTINE HCL) take one tab by mouth two times a day PULMICORT 0.5 MG/2ML  SUSP (BUDESONIDE (INHALATION)) use two times a day FLONASE 50 MCG/ACT  SUSP (FLUTICASONE PROPIONATE) use two sprays in each nostril two times a day PREDNISONE 10 MG  TABS (PREDNISONE) Take 1 tablet by mouth once a day or as directed for taper on bottom of med calendar NEXIUM 40 MG  CPDR (ESOMEPRAZOLE MAGNESIUM) Take  one 30-60 min before first meal of the day TUMS E-X 750 750 MG CHEW (CALCIUM CARBONATE ANTACID) three times a day with meals COUMADIN 3 MG  TABS (WARFARIN SODIUM) take as directed per coumadin clinic FUROSEMIDE 40 MG TABS (FUROSEMIDE) Take 1  tablet by mouth once a day VITAMIN D 2000 UNIT TABS (CHOLECALCIFEROL) Take 1 tablet by mouth once a day ALBUTEROL SULFATE (2.5 MG/3ML) 0.083%  NEBU (ALBUTEROL SULFATE) 1 neb evey 4 hours if needed MUCINEX DM 30-600 MG  TB12 (DEXTROMETHORPHAN-GUAIFENESIN) 1-2 two times a day as needed HYDROXYZINE HCL 25 MG TABS (HYDROXYZINE HCL) 1 by mouth every 4 hours as needed for itching PREDNISONE 10 MG TABS (PREDNISONE) increase to 2 tabs daily until back to baseline, then back to 1 tab daily KLOR-CON 10 10 MEQ CR-TABS (POTASSIUM CHLORIDE) 2 tabs once daily TRIAMCINOLONE ACETONIDE 0.1 % CREA (TRIAMCINOLONE ACETONIDE) apply two times a day as needed BROVANA 15 MCG/2ML  NEBU (ARFORMOTEROL TARTRATE) One in nebulizer twice daily with budesonide   Anticoagulation Visit Questionnaire      Coumadin dose missed/changed:  No      Abnormal Bleeding Symptoms:  No   Any diet changes including alcohol intake, vegetables or greens since the last visit:  No Any illnesses or hospitalizations since the last visit:  No Any signs of clotting since the last visit (including chest discomfort, dizziness, shortness of breath, arm tingling, slurred speech, swelling or redness in leg):  No  Laboratory Results   Urine Tests    Routine Urinalysis   Color: straw Appearance: Clear Glucose: negative   (Normal Range: Negative) Bilirubin: negative   (Normal Range: Negative) Ketone: negative   (Normal Range: Negative) Spec. Gravity: <1.005   (Normal Range: 1.003-1.035) Blood: trace-lysed   (Normal Range: Negative) pH: 7.5   (Normal Range: 5.0-8.0) Protein: negative   (Normal Range: Negative) Urobilinogen: negative   (Normal Range: 0-1) Nitrite: negative   (Normal Range: Negative) Leukocyte Esterace: negative   (Normal Range: Negative)     Blood Tests     PT: 13.5 s   (Normal Range: 10.6-13.4)  INR: 1.2   (Normal Range: 0.88-1.12   Therap INR: 2.0-3.5) Comments: Wynona Canes, CMA  Apr 09, 2010 4:43  PM

## 2010-12-31 NOTE — Progress Notes (Signed)
Summary: Cardiology referral question  Phone Note Call from Patient Call back at Home Phone [phone]   Caller: Patient Call For: Nelwyn Salisbury MD Summary of Call: Triage VM from [facility] in follow-up to OV last week.  They are wondering about the Cardiology referral Dr Clent Ridges talked about?  Dr Clent Ridges does have "refer to Cardiology, but no order was done OV dated 1/15 Initial call taken by: Sid Falcon LPN,  December 20, 2008 12:18 PM  Follow-up for Phone Call        I did intend for her to see Cardiology. Please set up a referral for edema with possible CHF. Follow-up by: Nelwyn Salisbury MD,  December 21, 2008 12:39 PM  Additional Follow-up for Phone Call Additional follow up Details #1::        Phone Call Completed Additional Follow-up by: Alfred Levins, CMA,  December 21, 2008 12:40 PM

## 2010-12-31 NOTE — Assessment & Plan Note (Signed)
Summary: pt fell this morning//ccm   Vital Signs:  Patient profile:   77 year old female Weight:      157 pounds BMI:     31.82 BP sitting:   146 / 72  (left arm) Cuff size:   regular  Vitals Entered By: Raechel Ache, RN (Apr 30, 2010 11:39 AM) CC: Daughter states she fell down 3 steps this am, was holding head and limping.   History of Present Illness: Here with her daughter after a fall this am when she was going down some steps with another caregiver. Apparently she landed on her buttocks on a step. She denies being in any pain, but her daughter noticed her limping slightly at home. No other signs of trauma.   Allergies: No Known Drug Allergies  Past History:  Past Medical History: Reviewed history from 04/09/2010 and no changes required. Asthma, sees Dr. [name]    - On daily prednsione since 2009  Hypertension Osteoporosis GERD Dementia Pulmonary embolism, hx of 4-09, sees Dr. [name] leg edema Complex med regimen--med calendar August 24, 2009 (daughter  doesn't remember this ov January 03, 2010 )  Flu shot-- August 24, 2009  ECHO 12-2008 showed EF of 70%  Past Surgical History: Reviewed history from 04/09/2010 and no changes required. Cholecystectomy Lithotripsy Rt knee surgery  Review of Systems  The patient denies anorexia, fever, weight loss, weight gain, vision loss, decreased hearing, hoarseness, chest pain, syncope, dyspnea on exertion, peripheral edema, prolonged cough, headaches, hemoptysis, abdominal pain, melena, hematochezia, severe indigestion/heartburn, hematuria, incontinence, genital sores, muscle weakness, suspicious skin lesions, transient blindness, difficulty walking, depression, unusual weight change, abnormal bleeding, enlarged lymph nodes, angioedema, breast masses, and testicular masses.    Physical Exam  General:  alert, in NAD. She walks normally and gets up from the chair normally without assistance.  Head:  Normocephalic and  atraumatic without obvious abnormalities. No apparent alopecia or balding. Eyes:  No corneal or conjunctival inflammation noted. EOMI. Perrla. Funduscopic exam benign, without hemorrhages, exudates or papilledema. Vision grossly normal. Neck:  No deformities, masses, or tenderness noted. Lungs:  Normal respiratory effort, chest expands symmetrically. Lungs are clear to auscultation, no crackles or wheezes. Heart:  Normal rate and regular rhythm. S1 and S2 normal without gallop, murmur, click, rub or other extra sounds. Msk:  No deformity or scoliosis noted of thoracic or lumbar spine.  Both hips show full ROM with no tenderness or signs of pain.   Impression & Recommendations:  Problem # 1:  CONTUSION OF BUTTOCK (ICD-922.32)  Complete Medication List: 1)  Multivitamins Tabs (Multiple vitamin) .... Take one tab by mouth once daily 2)  Citracal Plus Tabs (Multiple minerals-vitamins) .... Take 1 tablet by mouth two times a day 3)  Singulair 10 Mg Tabs (Montelukast sodium) .... Take one tab by mouth once daily 4)  Namenda 10 Mg Tabs (Memantine hcl) .... Take one tab by mouth two times a day 5)  Pulmicort 0.5 Mg/57ml Susp (Budesonide (inhalation)) .... Use two times a day 6)  Flonase 50 Mcg/act Susp (Fluticasone propionate) .... Use two sprays in each nostril two times a day 7)  Prednisone 10 Mg Tabs (Prednisone) .... Take 1 tablet by mouth once a day or as directed for taper on bottom of med calendar 8)  Nexium 40 Mg Cpdr (Esomeprazole magnesium) .... Take  one 30-60 min before first meal of the day 9)  Tums E-x 750 750 Mg Chew (Calcium carbonate antacid) .... Three times a day with  meals 10)  Coumadin 3 Mg Tabs (Warfarin sodium) .... Take as directed per coumadin clinic 11)  Furosemide 40 Mg Tabs (Furosemide) .... Take 1 tablet by mouth once a day 12)  Vitamin D 2000 Unit Tabs (Cholecalciferol) .... Take 1 tablet by mouth once a day 13)  Albuterol Sulfate (2.5 Mg/73ml) 0.083% Nebu (Albuterol  sulfate) .Marland Kitchen.. 1 neb evey 4 hours if needed 14)  Mucinex Dm 30-600 Mg Tb12 (Dextromethorphan-guaifenesin) .Marland Kitchen.. 1-2 two times a day as needed 15)  Hydroxyzine Hcl 25 Mg Tabs (Hydroxyzine hcl) .Marland Kitchen.. 1 by mouth every 4 hours as needed for itching 16)  Prednisone 10 Mg Tabs (Prednisone) .... Increase to 2 tabs daily until back to baseline, then back to 1 tab daily 17)  Klor-con 10 10 Meq Cr-tabs (Potassium chloride) .... 2 tabs once daily 18)  Triamcinolone Acetonide 0.1 % Crea (Triamcinolone acetonide) .... Apply two times a day as needed 19)  Brovana 15 Mcg/23ml Nebu (Arformoterol tartrate) .... One in nebulizer twice daily with budesonide  Patient Instructions: 1)  She seems to be okay. They will watch her closely. Use Tylenol as needed for pain. 2)  Please schedule a follow-up appointment as needed .   Appended Document: pt fell this morning//ccm   ANTICOAGULATION RECORD PREVIOUS REGIMEN & LAB RESULTS Anticoagulation Diagnosis:  dvt,v58.83,v58.91 on  04/13/2008 Previous INR Goal Range:  2.0-3.0 on  04/13/2008 Previous INR:  1.2 on  04/09/2010 Previous Coumadin Dose(mg):  3mg  on m,f. 2mg  other days on  09/22/2008 Previous Regimen:  3mg  on m,w,f 2mg  on other days on  04/09/2010 Previous Coagulation Comments:  OV on  09/22/2008  NEW REGIMEN & LAB RESULTS Current INR: 3.5 Regimen: same  Repeat testing in: 4 weeks  Anticoagulation Visit Questionnaire Coumadin dose missed/changed:  No Abnormal Bleeding Symptoms:  No  Any diet changes including alcohol intake, vegetables or greens since the last visit:  No Any illnesses or hospitalizations since the last visit:  No Any signs of clotting since the last visit (including chest discomfort, dizziness, shortness of breath, arm tingling, slurred speech, swelling or redness in leg):  No  MEDICATIONS MULTIVITAMINS   TABS (MULTIPLE VITAMIN) take one tab by mouth once daily CITRACAL PLUS  TABS (MULTIPLE MINERALS-VITAMINS) Take 1 tablet by mouth  two times a day SINGULAIR 10 MG  TABS (MONTELUKAST SODIUM) take one tab by mouth once daily NAMENDA 10 MG  TABS (MEMANTINE HCL) take one tab by mouth two times a day PULMICORT 0.5 MG/2ML  SUSP (BUDESONIDE (INHALATION)) use two times a day FLONASE 50 MCG/ACT  SUSP (FLUTICASONE PROPIONATE) use two sprays in each nostril two times a day PREDNISONE 10 MG  TABS (PREDNISONE) Take 1 tablet by mouth once a day or as directed for taper on bottom of med calendar NEXIUM 40 MG  CPDR (ESOMEPRAZOLE MAGNESIUM) Take  one 30-60 min before first meal of the day TUMS E-X 750 750 MG CHEW (CALCIUM CARBONATE ANTACID) three times a day with meals COUMADIN 3 MG  TABS (WARFARIN SODIUM) take as directed per coumadin clinic FUROSEMIDE 40 MG TABS (FUROSEMIDE) Take 1 tablet by mouth once a day VITAMIN D 2000 UNIT TABS (CHOLECALCIFEROL) Take 1 tablet by mouth once a day ALBUTEROL SULFATE (2.5 MG/3ML) 0.083%  NEBU (ALBUTEROL SULFATE) 1 neb evey 4 hours if needed MUCINEX DM 30-600 MG  TB12 (DEXTROMETHORPHAN-GUAIFENESIN) 1-2 two times a day as needed HYDROXYZINE HCL 25 MG TABS (HYDROXYZINE HCL) 1 by mouth every 4 hours as needed for itching PREDNISONE 10 MG TABS (  PREDNISONE) increase to 2 tabs daily until back to baseline, then back to 1 tab daily KLOR-CON 10 10 MEQ CR-TABS (POTASSIUM CHLORIDE) 2 tabs once daily TRIAMCINOLONE ACETONIDE 0.1 % CREA (TRIAMCINOLONE ACETONIDE) apply two times a day as needed BROVANA 15 MCG/2ML  NEBU (ARFORMOTEROL TARTRATE) One in nebulizer twice daily with budesonide    Laboratory Results   Blood Tests      INR: 3.5   (Normal Range: 0.88-1.12   Therap INR: 2.0-3.5) Comments: Rita Ohara  Apr 30, 2010 12:42 PM

## 2010-12-31 NOTE — Assessment & Plan Note (Signed)
Summary: Pulmonary/ ext summary f/u ov   Primary Provider/Referring Provider:  Clent Ridges  CC:  Followup.  Pt states that overall she has been doing well.  She does c/o dry cough and runny nose since weather change.  Marland Kitchen  History of Present Illness: 89 yobf never smoker with asthma and a great deal of difficulty with adherence due to dementia.  Discharge on 01/27/08  after asthma exac no sign worsening in her own home with daughter doing meds but not consistently using her maintenance meds.  September 22, 2008--Returns for routine follow up. Doing well with dyspnea at baseline, Continues on brovana and pulmicort two times a day.   July 20, 2009 worse for a week, not a maintenance regimen other than prednisone 10 mg per day not prescribed from this office.  Increase  resting dyspnea cough and congestion. no purulent sputum. Daughter is as confused with med instructions as patient is.   August 24, 2009--Returns for 1 month follow up and med review. Last visit asthma flare, prednisone increased to 20mg  until back to baseline. Returns today w/ daughter, Pt has dementia, her daughter does all her meds. Was confused w/ neb meds last visit. Did not understand to use pulmicort and albuterol two times a day. Pt improved back to baseline and on Prednisone 10mg  once daily  January 03, 2010 Followup.  Pt states that overall she has been doing well.  She does c/o dry cough and runny nose since weather change.  Daughter confused again with rx, not sure of names of meds, did not bring calendar provided, says never received one. concerned still on prednisone @ 10 mg per day. Pt denies any significant sore throat, dysphagia, itching, sneezing,  nasal congestion or excess secretions,  fever, chills, sweats, unintended wt loss, pleuritic or exertional cp, hempoptysis, change in activity tolerance  orthopnea pnd or leg swelling   Current Medications (verified): 1)  Multivitamins   Tabs (Multiple Vitamin) .... Take One  Tab By Mouth Once Daily 2)  Citracal Plus  Tabs (Multiple Minerals-Vitamins) .... Take 1 Tablet By Mouth Two Times A Day 3)  Singulair 10 Mg  Tabs (Montelukast Sodium) .... Take One Tab By Mouth Once Daily 4)  Namenda 10 Mg  Tabs (Memantine Hcl) .... Take One Tab By Mouth Two Times A Day 5)  Pulmicort 0.5 Mg/40ml  Susp (Budesonide (Inhalation)) .... Use Two Times A Day 6)  Albuterol Sulfate (2.5 Mg/37ml) 0.083% Nebu (Albuterol Sulfate) .Marland Kitchen.. 1 Vial Via Hhn Two Times A Day 7)  Flonase 50 Mcg/act  Susp (Fluticasone Propionate) .... Use Two Sprays in Each Nostril Two Times A Day 8)  Prednisone 10 Mg  Tabs (Prednisone) .... Take 1 Tablet By Mouth Once A Day or As Directed For Taper On Bottom of Med Calendar 9)  Nexium 40 Mg  Cpdr (Esomeprazole Magnesium) .... Take  One 30-60 Min Before First Meal of The Day 10)  Tums E-X 750 750 Mg Chew (Calcium Carbonate Antacid) .... Three Times A Day With Meals 11)  Coumadin 3 Mg  Tabs (Warfarin Sodium) .... Take As Directed Per Coumadin Clinic 12)  Furosemide 40 Mg Tabs (Furosemide) .... Take 1 Tablet By Mouth Once A Day 13)  Vitamin D 2000 Unit Tabs (Cholecalciferol) .... Take 1 Tablet By Mouth Once A Day 14)  Albuterol Sulfate (2.5 Mg/47ml) 0.083%  Nebu (Albuterol Sulfate) .Marland Kitchen.. 1 Neb Two Times A Day and May Use Every 3 Hr As Needed For Wheezing. 15)  Mucinex Dm 30-600 Mg  Tb12 (Dextromethorphan-Guaifenesin) .Marland Kitchen.. 1-2 Two Times A Day As Needed 16)  Hydroxyzine Hcl 25 Mg Tabs (Hydroxyzine Hcl) .Marland Kitchen.. 1 By Mouth Every 4 Hours As Needed For Itching 17)  Prednisone 10 Mg Tabs (Prednisone) .... Increase To 2 Tabs Daily Until Back To Baseline, Then Back To 1 Tab Daily 18)  Klor-Con 10 10 Meq Cr-Tabs (Potassium Chloride) .... 2 Tabs Once Daily 19)  Triamcinolone Acetonide 0.1 % Crea (Triamcinolone Acetonide) .... Apply Two Times A Day As Needed  Allergies (verified): No Known Drug Allergies  Past History:  Past Medical History: Asthma    - On daily prednsione since  2009  Hypertension Osteoporosis GERD Dementia Pulmonary embolism, hx of 4-09, sees Dr. [name] leg edema Complex med regimen--med calendar August 24, 2009 (daughter  doesn't remember this ov January 03, 2010 )  Flu shot-- August 24, 2009  ECHO 12-2008 showed EF of 70%  Vital Signs:  Patient profile:   77 year old female Weight:      157 pounds O2 Sat:      98 % on Room air Temp:     98.5 degrees F oral Pulse rate:   90 / minute BP sitting:   116 / 78  (left arm)  Vitals Entered By: Vernie Murders (January 03, 2010 4:08 PM)  O2 Flow:  Room air  Physical Exam  Additional Exam:  GEN: A/Ox3 pleasant, mild confusion.  wt  163 > 157 July 20, 2009 >>161 > 157 February  3, 201 Mild cushingnoid facies HEENT:  [place]/AT, , EACs-clear, TMs-wnl, NOSE-clear, THROAT-clear NECK:  Supple w/ fair ROM; no JVD; normal carotid impulses w/o bruits; no thyromegaly or nodules palpated; no lymphadenopathy. CHEST:  Clear to P & A; w/o, wheezes/ rales/ or rhonchi. HEART:  RRR, no m/r/g   ABDOMEN:  Soft & nt; nml bowel sounds; no organomegaly or masses detected. EXT: Warm bil,  no calf pain, edema, clubbing, pulses intact Neuro: confused, but very pleasant.     Impression & Recommendations:  Problem # 1:  ASTHMA (ICD-493.90) I had an extended discussion with the patient and daughter  today lasting 15 to 20 minutes of a 25 minute visit on the following issues:  The goal with a chronic steroid dependent illness is always arriving at the lowest effective dose that controls the disease/symptoms and not accepting a set "formula" which is based on statistics that don't take into accound individual variability or the natural hx of the dz in every individual patient, which may well vary over time. For now will try floor of 5 mg per day with next step every other day then d/c eventually and to even hope this can happen will need to be on brovana and budesonide as a true fixed maintenance combo  rx.  However, this will demand a much higher level of maintaining medication reconciliation than what the pt's daughter appears willing or able to do so further f/u in pulmonary clinic necessitate full and accurate disclosure of all maint vs as needed meds.  Medications Added to Medication List This Visit: 1)  Albuterol Sulfate (2.5 Mg/61ml) 0.083% Nebu (Albuterol sulfate) .Marland Kitchen.. 1 neb evey 4 hours if needed 2)  Brovana 15 Mcg/18ml Nebu (Arformoterol tartrate) .... One in nebulizer twice daily with budesonide  Other Orders: Est. Patient Level IV (56387)  Patient Instructions: 1)  Change nebulizer to take brovana 1 vial with budesonide in the nebulizer and it's ok to use albuterol every 4 hours but only if needed for breathing  difficulty 2)  Once this change is made she should be able to reduce Prednisone to 10 mg one half daily. 3)  return here if need [place] to troubleshoot the problem but you must bring the medications separated into two bags, the  ones you take no matter what vs the ones you take only as needed   Prescriptions: BROVANA 15 MCG/2ML  NEBU (ARFORMOTEROL TARTRATE) One in nebulizer twice daily with budesonide  #60 x 11   Entered and Authorized by:   Nyoka Cowden MD   Signed by:   Nyoka Cowden MD on 01/03/2010   Method used:   Electronically to        [facility]  [phone]* (retail)       [address] [place], [place]  [address]       Ph: [phone] or [phone]       Fax: [phone]   RxID:   [id number]

## 2010-12-31 NOTE — Assessment & Plan Note (Signed)
Summary: pt/rcd   Nurse Visit   Allergies: No Known Drug Allergies Laboratory Results   Blood Tests   Date/Time Received: July 12, 2010 4:32 PM  Date/Time Reported: July 12, 2010 4:32 PM    INR: 2.6   (Normal Range: 0.88-1.12   Therap INR: 2.0-3.5) Comments: Wynona Canes, CMA  July 12, 2010 4:32 PM     Orders Added: 1)  Est. Patient Level I [99211] 2)  Protime [78469GE]  Laboratory Results   Blood Tests      INR: 2.6   (Normal Range: 0.88-1.12   Therap INR: 2.0-3.5) Comments: Wynona Canes, CMA  July 12, 2010 4:32 PM       ANTICOAGULATION RECORD PREVIOUS REGIMEN & LAB RESULTS Anticoagulation Diagnosis:  dvt,v58.83,v58.91 on  04/13/2008 Previous INR Goal Range:  2.0-3.0 on  04/13/2008 Previous INR:  3.3 on  05/31/2010 Previous Coumadin Dose(mg):  3mg  on m,w,f 2mg  on other days on  05/31/2010 Previous Regimen:  same on  04/30/2010 Previous Coagulation Comments:  OV on  09/22/2008  NEW REGIMEN & LAB RESULTS Current INR: 2.6 Regimen: same  (no change)       Repeat testing in: 4 weeks MEDICATIONS MULTIVITAMINS   TABS (MULTIPLE VITAMIN) take one tab by mouth once daily CITRACAL PLUS  TABS (MULTIPLE MINERALS-VITAMINS) Take 1 tablet by mouth two times a day SINGULAIR 10 MG  TABS (MONTELUKAST SODIUM) take one tab by mouth once daily NAMENDA 10 MG  TABS (MEMANTINE HCL) take one tab by mouth two times a day PULMICORT 0.5 MG/2ML  SUSP (BUDESONIDE (INHALATION)) use two times a day FLONASE 50 MCG/ACT  SUSP (FLUTICASONE PROPIONATE) use two sprays in each nostril two times a day PREDNISONE 10 MG  TABS (PREDNISONE) Take 1 tablet by mouth once a day or as directed for taper on bottom of med calendar NEXIUM 40 MG  CPDR (ESOMEPRAZOLE MAGNESIUM) Take  one 30-60 min before first meal of the day TUMS E-X 750 750 MG CHEW (CALCIUM CARBONATE ANTACID) three times a day with meals COUMADIN 3 MG  TABS (WARFARIN SODIUM) take as directed per coumadin clinic FUROSEMIDE  40 MG TABS (FUROSEMIDE) Take 1 tablet by mouth once a day VITAMIN D 2000 UNIT TABS (CHOLECALCIFEROL) Take 1 tablet by mouth once a day ALBUTEROL SULFATE (2.5 MG/3ML) 0.083%  NEBU (ALBUTEROL SULFATE) 1 neb evey 4 hours if needed MUCINEX DM 30-600 MG  TB12 (DEXTROMETHORPHAN-GUAIFENESIN) 1-2 two times a day as needed HYDROXYZINE HCL 25 MG TABS (HYDROXYZINE HCL) 1 by mouth every 4 hours as needed for itching PREDNISONE 10 MG TABS (PREDNISONE) increase to 2 tabs daily until back to baseline, then back to 1 tab daily KLOR-CON 10 10 MEQ CR-TABS (POTASSIUM CHLORIDE) 2 tabs once daily TRIAMCINOLONE ACETONIDE 0.1 % CREA (TRIAMCINOLONE ACETONIDE) apply two times a day as needed BROVANA 15 MCG/2ML  NEBU (ARFORMOTEROL TARTRATE) One in nebulizer twice daily with budesonide AZELASTINE HCL 0.05 % SOLN (AZELASTINE HCL) 1 gtt in each eye two times a day   Anticoagulation Visit Questionnaire      Coumadin dose missed/changed:  No      Abnormal Bleeding Symptoms:  No   Any diet changes including alcohol intake, vegetables or greens since the last visit:  No Any illnesses or hospitalizations since the last visit:  No Any signs of clotting since the last visit (including chest discomfort, dizziness, shortness of breath, arm tingling, slurred speech, swelling or redness in leg):  No

## 2011-01-02 NOTE — Assessment & Plan Note (Signed)
Summary: fatigue/dm   Vital Signs:  Patient profile:   77 year old female Weight:      149 pounds Temp:     98.6 degrees F Pulse rate:   92 / minute BP sitting:   130 / 80  (left arm) Cuff size:   regular  Vitals Entered By: Pura Spice, RN (December 26, 2010 11:11 AM) CC: daughter states sleeping more apptetie fair     History of Present Illness: Here with her daughter complaining if some mild generalized fatigue and some questions. The daughter asks if it is possible to come off Coumadin. She has been on this for 3 years since she had a PE. Of note, she has been in the ER twice in the past 3 months for falls. Fortunately no significant injuries occurred. No other specicif complaints lately.   Allergies (verified): No Known Drug Allergies  Past History:  Past Medical History: Reviewed history from 04/09/2010 and no changes required. Asthma, sees Dr. [name]    - On daily prednsione since 2009  Hypertension Osteoporosis GERD Dementia Pulmonary embolism, hx of 4-09, sees Dr. [name] leg edema Complex med regimen--med calendar August 24, 2009 (daughter  doesn't remember this ov January 03, 2010 )  Flu shot-- August 24, 2009  ECHO 12-2008 showed EF of 70%  Past Surgical History: Reviewed history from 04/09/2010 and no changes required. Cholecystectomy Lithotripsy Rt knee surgery  Review of Systems  The patient denies anorexia, fever, weight loss, weight gain, vision loss, decreased hearing, hoarseness, chest pain, syncope, dyspnea on exertion, peripheral edema, prolonged cough, headaches, hemoptysis, abdominal pain, melena, hematochezia, severe indigestion/heartburn, hematuria, incontinence, genital sores, muscle weakness, suspicious skin lesions, transient blindness, difficulty walking, depression, unusual weight change, abnormal bleeding, enlarged lymph nodes, angioedema, breast masses, and testicular masses.    Physical Exam  General:   Well-developed,well-nourished,in no acute distress; alert,appropriate and cooperative throughout examination Neck:  No deformities, masses, or tenderness noted. Lungs:  Normal respiratory effort, chest expands symmetrically. Lungs are clear to auscultation, no crackles or wheezes. Heart:  Normal rate and regular rhythm. S1 and S2 normal without gallop, murmur, click, rub or other extra sounds. Abdomen:  Bowel sounds positive,abdomen soft and non-tender without masses, organomegaly or hernias noted.   Impression & Recommendations:  Problem # 1:  COUMADIN THERAPY (ICD-V58.61)  Problem # 2:  PULMONARY EMBOLISM, HX OF (ICD-V12.51)  The following medications were removed from the medication list:    Coumadin 3 Mg Tabs (Warfarin sodium) .Marland Kitchen... Take as directed per coumadin clinic Her updated medication list for this problem includes:    Aspirin 81 Mg Tabs (Aspirin) ..... Once daily  Problem # 3:  DEMENTIA (ICD-294.8)  Problem # 4:  HYPERTENSION (ICD-401.9)  Her updated medication list for this problem includes:    Furosemide 40 Mg Tabs (Furosemide) .Marland Kitchen... Take 1 tablet by mouth once a day  Orders: Fingerstick (16109) TLB-BMP (Basic Metabolic Panel-BMET) (80048-METABOL) TLB-CBC Platelet - w/Differential (85025-CBCD) TLB-TSH (Thyroid Stimulating Hormone) (84443-TSH) TLB-Hepatic/Liver Function Pnl (80076-HEPATIC)  Complete Medication List: 1)  Multivitamins Tabs (Multiple vitamin) .... Take one tab by mouth once daily 2)  Citracal Plus Tabs (Multiple minerals-vitamins) .... Take 1 tablet by mouth two times a day 3)  Singulair 10 Mg Tabs (Montelukast sodium) .... Take one tab by mouth once daily 4)  Namenda 10 Mg Tabs (Memantine hcl) .... Take one tab by mouth two times a day 5)  Pulmicort 0.5 Mg/18ml Susp (Budesonide (inhalation)) .... Use two times a day 6)  Flonase 50 Mcg/act Susp (Fluticasone propionate) .... Use two sprays in each nostril two times a day 7)  Prednisone 10 Mg Tabs  (Prednisone) .... Take 1 tablet by mouth once a day or as directed for taper on bottom of med calendar 8)  Nexium 40 Mg Cpdr (Esomeprazole magnesium) .... Take  one 30-60 min before first meal of the day 9)  Tums E-x 750 750 Mg Chew (Calcium carbonate antacid) .... Three times a day with meals 10)  Furosemide 40 Mg Tabs (Furosemide) .... Take 1 tablet by mouth once a day 11)  Vitamin D 2000 Unit Tabs (Cholecalciferol) .... Take 1 tablet by mouth once a day 12)  Albuterol Sulfate (2.5 Mg/101ml) 0.083% Nebu (Albuterol sulfate) .Marland Kitchen.. 1 neb evey 4 hours if needed 13)  Mucinex Dm 30-600 Mg Tb12 (Dextromethorphan-guaifenesin) .Marland Kitchen.. 1-2 two times a day as needed 14)  Hydroxyzine Hcl 25 Mg Tabs (Hydroxyzine hcl) .Marland Kitchen.. 1 by mouth every 4 hours as needed for itching 15)  Prednisone 10 Mg Tabs (Prednisone) .... Increase to 2 tabs daily until back to baseline, then back to 1 tab daily 16)  Klor-con 10 10 Meq Cr-tabs (Potassium chloride) .... 2 tabs once daily 17)  Triamcinolone Acetonide 0.1 % Crea (Triamcinolone acetonide) .... Apply two times a day as needed 18)  Brovana 15 Mcg/15ml Nebu (Arformoterol tartrate) .... One in nebulizer twice daily with budesonide 19)  Azelastine Hcl 0.05 % Soln (Azelastine hcl) .Marland Kitchen.. 1 gtt in each eye two times a day 20)  Aspirin 81 Mg Tabs (Aspirin) .... Once daily  Patient Instructions: 1)  At this point with her increased risk of falls, I think the risk of staying on Coumadin exceeds the risk of a recurrent PE. Her daughter and I agreed to stop Coumadin and switch to a baby ASA daily. get labs today   Orders Added: 1)  Fingerstick [36416] 2)  TLB-BMP (Basic Metabolic Panel-BMET) [80048-METABOL] 3)  TLB-CBC Platelet - w/Differential [85025-CBCD] 4)  TLB-TSH (Thyroid Stimulating Hormone) [84443-TSH] 5)  TLB-Hepatic/Liver Function Pnl [80076-HEPATIC] 6)  Est. Patient Level IV [16109]  Appended Document: Orders Update     Clinical Lists Changes  Orders: Added new  Service order of Specimen Handling (60454) - Signed Observations: Added new observation of CUR. REGIMEN: Stopped coumadin (12/26/2010 11:48) Added new observation of COMMENTS2: Rita Ohara  December 26, 2010 12:00 PM  (12/26/2010 11:48) Added new observation of INR: 2.7  (12/26/2010 11:48) Added new observation of ABNORM BLEED: No  (12/26/2010 11:48) Added new observation of COUMADIN CHG: No  (12/26/2010 11:48)       ANTICOAGULATION RECORD PREVIOUS REGIMEN & LAB RESULTS Anticoagulation Diagnosis:  dvt,v58.83,v58.91 on  04/13/2008 Previous INR Goal Range:  2.0-3.0 on  04/13/2008 Previous INR:  2.6 on  08/14/2010 Previous Coumadin Dose(mg):  3mg  on m,w,f 2mg  on other days on  05/31/2010 Previous Regimen:  same on  04/30/2010 Previous Coagulation Comments:  OV on  09/22/2008  NEW REGIMEN & LAB RESULTS Current INR: 2.7 Regimen: Stopped coumadin   Anticoagulation Visit Questionnaire Coumadin dose missed/changed:  No Abnormal Bleeding Symptoms:  No  Any diet changes including alcohol intake, vegetables or greens since the last visit:  No Any illnesses or hospitalizations since the last visit:  No Any signs of clotting since the last visit (including chest discomfort, dizziness, shortness of breath, arm tingling, slurred speech, swelling or redness in leg):  No  MEDICATIONS MULTIVITAMINS   TABS (MULTIPLE VITAMIN) take one tab by mouth once daily CITRACAL PLUS  TABS (MULTIPLE MINERALS-VITAMINS) Take 1 tablet by mouth two times a day SINGULAIR 10 MG  TABS (MONTELUKAST SODIUM) take one tab by mouth once daily NAMENDA 10 MG  TABS (MEMANTINE HCL) take one tab by mouth two times a day PULMICORT 0.5 MG/2ML  SUSP (BUDESONIDE (INHALATION)) use two times a day FLONASE 50 MCG/ACT  SUSP (FLUTICASONE PROPIONATE) use two sprays in each nostril two times a day PREDNISONE 10 MG  TABS (PREDNISONE) Take 1 tablet by mouth once a day or as directed for taper on bottom of med calendar NEXIUM 40 MG   CPDR (ESOMEPRAZOLE MAGNESIUM) Take  one 30-60 min before first meal of the day TUMS E-X 750 750 MG CHEW (CALCIUM CARBONATE ANTACID) three times a day with meals FUROSEMIDE 40 MG TABS (FUROSEMIDE) Take 1 tablet by mouth once a day VITAMIN D 2000 UNIT TABS (CHOLECALCIFEROL) Take 1 tablet by mouth once a day ALBUTEROL SULFATE (2.5 MG/3ML) 0.083%  NEBU (ALBUTEROL SULFATE) 1 neb evey 4 hours if needed MUCINEX DM 30-600 MG  TB12 (DEXTROMETHORPHAN-GUAIFENESIN) 1-2 two times a day as needed HYDROXYZINE HCL 25 MG TABS (HYDROXYZINE HCL) 1 by mouth every 4 hours as needed for itching PREDNISONE 10 MG TABS (PREDNISONE) increase to 2 tabs daily until back to baseline, then back to 1 tab daily KLOR-CON 10 10 MEQ CR-TABS (POTASSIUM CHLORIDE) 2 tabs once daily TRIAMCINOLONE ACETONIDE 0.1 % CREA (TRIAMCINOLONE ACETONIDE) apply two times a day as needed BROVANA 15 MCG/2ML  NEBU (ARFORMOTEROL TARTRATE) One in nebulizer twice daily with budesonide AZELASTINE HCL 0.05 % SOLN (AZELASTINE HCL) 1 gtt in each eye two times a day ASPIRIN 81 MG TABS (ASPIRIN) once daily    Laboratory Results   Blood Tests      INR: 2.7   (Normal Range: 0.88-1.12   Therap INR: 2.0-3.5) Comments: Rita Ohara  December 26, 2010 12:00 PM

## 2011-01-08 ENCOUNTER — Encounter: Payer: Self-pay | Admitting: Internal Medicine

## 2011-01-08 ENCOUNTER — Ambulatory Visit (INDEPENDENT_AMBULATORY_CARE_PROVIDER_SITE_OTHER): Payer: Medicare PPO | Admitting: Internal Medicine

## 2011-01-08 DIAGNOSIS — J45909 Unspecified asthma, uncomplicated: Secondary | ICD-10-CM

## 2011-01-16 NOTE — Assessment & Plan Note (Signed)
Summary: Pulmonary/ ext ov re steroid dep asthma   Primary Provider/Referring Provider:  Clent Ridges  CC:  Dyspnea- the same.  History of Present Illness: 79 yobf never smoker with asthma and a great deal of difficulty with adherence due to dementia.  Discharge on 01/27/08  after asthma exac no sign worsening in her own home with daughter doing meds but not consistently using her maintenance meds.  September 22, 2008--Returns for routine follow up. Doing well with dyspnea at baseline, Continues on brovana and pulmicort two times a day.   July 20, 2009 worse for a week, not a maintenance regimen other than prednisone 10 mg per day not prescribed from this office.  Increase  resting dyspnea cough and congestion. no purulent sputum. Daughter is as confused with med instructions as patient is.   August 24, 2009--Returns for 1 month follow up and med review. Last visit asthma flare, prednisone increased to 20mg  until back to baseline. Returns today w/ daughter, Pt has dementia, her daughter does all her meds. Was confused w/ neb meds last visit. Did not understand to use pulmicort and albuterol two times a day. Pt improved back to baseline and on Prednisone 10mg  once daily  January 03, 2010 Followup.  Pt states that overall she has been doing well.  She does c/o dry cough and runny nose since weather change.  Daughter confused again with rx, not sure of names of meds, did not bring calendar provided, says never received one. concerned still on prednisone @ 10 mg per day.  rec trial of option : change to Brovana and Budesonide, the equivalent of Symbicort, per neb. and ov for med rec, did neither. January 08, 2011 ov "so you can get her off prednisone".  did none of the previousl recs, not clear who is refilling her prednisone, daughter is again thoroughly confused with meds/ nebs etc.  pt not sob or coughing but still on pred 10 mg daily.  Pt denies any significant sore throat, dysphagia, itching, sneezing,   nasal congestion or excess secretions,  fever, chills, sweats, unintended wt loss, pleuritic or exertional cp, hempoptysis, change in activity tolerance  orthopnea pnd or leg swelling Pt also denies any obvious fluctuation in symptoms with weather or environmental change or other alleviating or aggravating factors.     not clear how much albuterol she's using  Current Medications (verified): 1)  Multivitamins   Tabs (Multiple Vitamin) .... Take One Tab By Mouth Once Daily 2)  Citracal Plus  Tabs (Multiple Minerals-Vitamins) .... Take 1 Tablet By Mouth Two Times A Day 3)  Singulair 10 Mg  Tabs (Montelukast Sodium) .... Take One Tab By Mouth Once Daily 4)  Namenda 10 Mg  Tabs (Memantine Hcl) .... Take One Tab By Mouth Two Times A Day 5)  Pulmicort 0.5 Mg/82ml  Susp (Budesonide (Inhalation)) .... Use Two Times A Day 6)  Flonase 50 Mcg/act  Susp (Fluticasone Propionate) .... Use Two Sprays in Each Nostril Two Times A Day-Ran Out 7)  Prednisone 10 Mg  Tabs (Prednisone) .Marland Kitchen.. 1 Once Daily 8)  Tums E-X 750 750 Mg Chew (Calcium Carbonate Antacid) .... Three Times A Day With Meals As Needed 9)  Furosemide 40 Mg Tabs (Furosemide) .... Take 1 Tablet By Mouth Once A Day 10)  Vitamin D 2000 Unit Tabs (Cholecalciferol) .... Take 1 Tablet By Mouth Once A Day 11)  Albuterol Sulfate (2.5 Mg/38ml) 0.083%  Nebu (Albuterol Sulfate) .Marland Kitchen.. 1 Neb Evey 4 Hours If Needed 12)  Mucinex Dm 30-600 Mg  Tb12 (Dextromethorphan-Guaifenesin) .Marland Kitchen.. 1-2 Two Times A Day As Needed 13)  Hydroxyzine Hcl 25 Mg Tabs (Hydroxyzine Hcl) .Marland Kitchen.. 1 By Mouth Every 4 Hours As Needed For Itching 14)  Klor-Con 10 10 Meq Cr-Tabs (Potassium Chloride) .... 2 Tabs Twice A Day 15)  Triamcinolone Acetonide 0.1 % Crea (Triamcinolone Acetonide) .... Apply Two Times A Day As Needed 16)  Brovana 15 Mcg/95ml  Nebu (Arformoterol Tartrate) .... One in Nebulizer Twice Daily With Budesonide 17)  Azelastine Hcl 0.05 % Soln (Azelastine Hcl) .Marland Kitchen.. 1 Gtt in Each Eye Two  Times A Day 18)  Aspirin 81 Mg Tabs (Aspirin) .... Once Daily  Allergies (verified): No Known Drug Allergies  Past History:  Past Medical History: Asthma, sees Dr. [name] but poor adherence documented January 08, 2011     - On daily prednsione since 2009  Hypertension Osteoporosis GERD Dementia Pulmonary embolism, hx of 4-09, sees Dr. [name] leg edema Complex med regimen--med calendar August 24, 2009 (daughter  doesn't remember this  @ ov January 03, 2010 )  Flu shot-- August 24, 2009  ECHO 12-2008 showed EF of 70%  Vital Signs:  Patient profile:   77 year old female Weight:      200 pounds O2 Sat:      94 % on Room air Temp:     98.3 degrees F oral Pulse rate:   107 / minute BP sitting:   122 / 76  (left arm)  Vitals Entered By: Vernie Murders (January 08, 2011 3:34 PM)  O2 Flow:  Room air  Physical Exam  Additional Exam:  GEN: A/Ox3 pleasant, mild confusion.  wt  163 > 157 July 20, 2009 >>161 > 157 February  3, 201 Mod  cushingnoid facies child like affect  HEENT:  [place]/AT, , EACs-clear, TMs-wnl, NOSE-clear, THROAT-clear NECK:  Supple w/ fair ROM; no JVD; normal carotid impulses w/o bruits; no thyromegaly or nodules palpated; no lymphadenopathy. CHEST:  Clear to P & A; w/o, wheezes/ rales/ or rhonchi. HEART:  RRR, no m/r/g   ABDOMEN:  Soft & nt; nml bowel sounds; no organomegaly or masses detected. EXT: Warm bil,  no calf pain, edema, clubbing, pulses intact Neuro: confused, but very pleasant.     Impression & Recommendations:  Problem # 1:  ASTHMA (ICD-493.90)  Her updated medication list for this problem includes:    Singulair 10 Mg Tabs (Montelukast sodium) .Marland Kitchen... Take one tab by mouth once daily    Pulmicort 0.5 Mg/22ml Susp (Budesonide (inhalation)) ..... Use two times a day    Albuterol Sulfate (2.5 Mg/63ml) 0.083% Nebu (Albuterol sulfate) .Marland Kitchen... 1 neb evey 4 hours if needed    Mucinex Dm 30-600 Mg Tb12 (Dextromethorphan-guaifenesin) .Marland Kitchen... 1-2 two times  a day as needed    Brovana 15 Mcg/32ml Nebu (Arformoterol tartrate) ..... One in nebulizer twice daily with budesonide    DDX of  difficult airways managment all start with A and  include Adherence, Ace Inhibitors, Acid Reflux, Active Sinus Disease, Alpha 1 Antitripsin deficiency, Anxiety masquerading as Airways dz,  ABPA,  allergy(esp in young), Aspiration (esp in elderly), Adverse effects of DPI,  Active smokers, plus two Bs  = Bronchiectasis and Beta blocker use..and one C= CHF    Adherence is always the initial "prime suspect" and is a multilayered concern that requires a "trust but verify" approach in every patient - starting with knowing how to use medications, especially inhalers, correctly, keeping up with refills and understanding  the fundamental difference between maintenance and prns vs those medications only taken for a very short course and then stopped and not refilled. Her daughter has yet to get the message re this issue and I'm not willing to refill prednisone or any other maint rx wihout first assuring she has a system that helps her understand how to take her meds.   To keep things simple, I have asked the patient to first separate medicines that are perceived as maintenance, that is to be taken daily "no matter what", from those medicines that are taken on only on an as-needed basis and I have given the patient examples of both, and then return to see our NP to generate a  detailed  medication calendar which should be followed until the next physician sees the patient and updates it.   See instructions for specific recommendations   Medications Added to Medication List This Visit: 1)  Flonase 50 Mcg/act Susp (Fluticasone propionate) .... Use two sprays in each nostril two times a day-ran out 2)  Prednisone 10 Mg Tabs (Prednisone) .Marland Kitchen.. 1 once daily 3)  Tums E-x 750 750 Mg Chew (Calcium carbonate antacid) .... Three times a day with meals as needed 4)  Klor-con 10 10 Meq Cr-tabs  (Potassium chloride) .... 2 tabs twice a day  Other Orders: Est. Patient Level IV (78469)  Patient Instructions: 1)  Budesonide 0.5 mg with Albuterol and then again 12 hours later perfectly regularly 2)  In beween these treatments ok to use albuterol every 4 hours if needed 3)  See Tammy NP w/in 2 weeks with all your medications, even over the counter meds, separated in two separate bags, the ones you take no matter what vs the ones you stop once you feel better and take only as needed.  She will generate for you a new user friendly medication calendar that will put [place] all on the same page re: your medication use.  Bring pill organizer with you 4)  If possible Tammy will try to get you the Rosalyn Gess which is a 12 hour form of albuterol

## 2011-01-20 ENCOUNTER — Other Ambulatory Visit: Payer: Self-pay | Admitting: Family Medicine

## 2011-01-20 DIAGNOSIS — R609 Edema, unspecified: Secondary | ICD-10-CM

## 2011-01-22 ENCOUNTER — Encounter: Payer: Self-pay | Admitting: Adult Health

## 2011-01-27 ENCOUNTER — Encounter: Payer: Self-pay | Admitting: Adult Health

## 2011-01-27 ENCOUNTER — Encounter (INDEPENDENT_AMBULATORY_CARE_PROVIDER_SITE_OTHER): Payer: Medicaid Other | Admitting: Adult Health

## 2011-01-27 DIAGNOSIS — J45909 Unspecified asthma, uncomplicated: Secondary | ICD-10-CM

## 2011-01-30 ENCOUNTER — Emergency Department (HOSPITAL_COMMUNITY)
Admission: EM | Admit: 2011-01-30 | Discharge: 2011-01-31 | Disposition: A | Discharge status: Home / Self Care | Payer: Medicare PPO | Attending: Emergency Medicine | Admitting: Emergency Medicine

## 2011-01-30 ENCOUNTER — Emergency Department (HOSPITAL_COMMUNITY): Payer: Medicare PPO

## 2011-01-30 DIAGNOSIS — J45909 Unspecified asthma, uncomplicated: Secondary | ICD-10-CM | POA: Insufficient documentation

## 2011-01-30 DIAGNOSIS — R4182 Altered mental status, unspecified: Secondary | ICD-10-CM | POA: Insufficient documentation

## 2011-01-30 DIAGNOSIS — N390 Urinary tract infection, site not specified: Secondary | ICD-10-CM | POA: Insufficient documentation

## 2011-01-30 DIAGNOSIS — F068 Other specified mental disorders due to known physiological condition: Secondary | ICD-10-CM | POA: Insufficient documentation

## 2011-01-30 DIAGNOSIS — R404 Transient alteration of awareness: Secondary | ICD-10-CM | POA: Insufficient documentation

## 2011-01-30 DIAGNOSIS — N281 Cyst of kidney, acquired: Secondary | ICD-10-CM | POA: Insufficient documentation

## 2011-01-30 DIAGNOSIS — R109 Unspecified abdominal pain: Secondary | ICD-10-CM | POA: Insufficient documentation

## 2011-01-30 LAB — URINALYSIS, ROUTINE W REFLEX MICROSCOPIC
Bilirubin Urine: NEGATIVE
Ketones, ur: NEGATIVE mg/dL
Specific Gravity, Urine: 1.015 (ref 1.005–1.030)
Urine Glucose, Fasting: NEGATIVE mg/dL
pH: 6.5 (ref 5.0–8.0)

## 2011-01-30 LAB — URINE MICROSCOPIC-ADD ON

## 2011-01-31 ENCOUNTER — Encounter (HOSPITAL_COMMUNITY): Payer: Self-pay

## 2011-01-31 ENCOUNTER — Emergency Department (HOSPITAL_COMMUNITY): Payer: Medicare PPO

## 2011-01-31 LAB — CBC
Hemoglobin: 14 g/dL (ref 12.0–15.0)
MCH: 27.9 pg (ref 26.0–34.0)
MCV: 88.4 fL (ref 78.0–100.0)
Platelets: 175 10*3/uL (ref 150–400)
RBC: 5.01 MIL/uL (ref 3.87–5.11)
WBC: 12.8 10*3/uL — ABNORMAL HIGH (ref 4.0–10.5)

## 2011-01-31 LAB — DIFFERENTIAL
Basophils Relative: 0 % (ref 0–1)
Lymphs Abs: 2.6 10*3/uL (ref 0.7–4.0)
Monocytes Relative: 8 % (ref 3–12)
Neutro Abs: 9 10*3/uL — ABNORMAL HIGH (ref 1.7–7.7)
Neutrophils Relative %: 71 % (ref 43–77)

## 2011-01-31 LAB — POCT I-STAT, CHEM 8
Chloride: 109 mEq/L (ref 96–112)
Glucose, Bld: 121 mg/dL — ABNORMAL HIGH (ref 70–99)
HCT: 45 % (ref 36.0–46.0)
Hemoglobin: 15.3 g/dL — ABNORMAL HIGH (ref 12.0–15.0)
Potassium: 4.4 mEq/L (ref 3.5–5.1)
Sodium: 144 mEq/L (ref 135–145)

## 2011-01-31 MED ORDER — IOHEXOL 300 MG/ML  SOLN
100.0000 mL | Freq: Once | INTRAMUSCULAR | Status: AC | PRN
  Administered 2011-01-31: 100 mL via INTRAVENOUS

## 2011-02-01 LAB — URINE CULTURE
Colony Count: >=100000
Culture  Setup Time: 201203020537

## 2011-02-05 ENCOUNTER — Telehealth (INDEPENDENT_AMBULATORY_CARE_PROVIDER_SITE_OTHER): Payer: Self-pay | Admitting: *Deleted

## 2011-02-06 NOTE — Assessment & Plan Note (Signed)
Summary: NP follow up - med calendar  Medications Added MULTIVITAMINS   TABS (MULTIPLE VITAMIN) Take 1 tab by mouth at bedtime VITAMIN B-12 1000 MCG TABS (CYANOCOBALAMIN) Take 1 tab by mouth at bedtime ALBUTEROL SULFATE (2.5 MG/3ML) 0.083%  NEBU (ALBUTEROL SULFATE) 1 vial in nebulizer two times a day PREDNISONE 10 MG  TABS (PREDNISONE) 1 once daily or see bottom of medication calendar for specific tapering instructions NEXIUM 40 MG CPDR (ESOMEPRAZOLE MAGNESIUM) Take 1 capsule by mouth once a day before breakfast TUMS E-X 750 750 MG CHEW (CALCIUM CARBONATE ANTACID) 1 in the morning and 1 at bedtime VITAMIN C 500 MG TABS (ASCORBIC ACID) Take 1 tablet by mouth once a day CHEWABLE CALCIUM 1250 MG CHEW (CALCIUM CARBONATE) 1 chew once daily ALBUTEROL SULFATE (2.5 MG/3ML) 0.083% NEBU (ALBUTEROL SULFATE) 1 neb every 3 hours as needed MUCINEX DM 30-600 MG  TB12 (DEXTROMETHORPHAN-GUAIFENESIN) Take 1-2 tablets every 12 hours as needed PREDNISONE 10 MG TABS (PREDNISONE) increase to 2 tabs by mouth daily until back to baseline, then back to 1 tab daily      Allergies Added: NKDA   Vital Signs:  Patient profile:   77 year old female Height:      59 inches Weight:      144.50 pounds BMI:     29.29 O2 Sat:      96 % on Room air Pulse rate:   114 / minute BP sitting:   128 / 68  (left arm) Cuff size:   regular  Vitals Entered By: Boone Master CNA/MA (January 27, 2011 12:02 PM)  O2 Flow:  Room air  Primary Provider/Referring Provider:  Clent Ridges  CC:  est med calendar and pt brought all meds with her today - daughter reports increased asthma attacks x1week.  History of Present Illness: 37 yobf never smoker with asthma and a great deal of difficulty with adherence due to dementia.  Discharge on 01/27/08  after asthma exac no sign worsening in her own home with daughter doing meds but not consistently using her maintenance meds.  September 22, 2008--Returns for routine follow up. Doing  well with dyspnea at baseline, Continues on brovana and pulmicort two times a day.   July 20, 2009 worse for a week, not a maintenance regimen other than prednisone 10 mg per day not prescribed from this office.  Increase  resting dyspnea cough and congestion. no purulent sputum. Daughter is as confused with med instructions as patient is.   August 24, 2009--Returns for 1 month follow up and med review. Last visit asthma flare, prednisone increased to 20mg  until back to baseline. Returns today w/ daughter, Pt has dementia, her daughter does all her meds. Was confused w/ neb meds last visit. Did not understand to use pulmicort and albuterol two times a day. Pt improved back to baseline and on Prednisone 10mg  once daily  January 03, 2010 Followup.  Pt states that overall she has been doing well.  She does c/o dry cough and runny nose since weather change.  Daughter confused again with rx, not sure of names of meds, did not bring calendar provided, says never received one. concerned still on prednisone @ 10 mg per day.  rec trial of option : change to Brovana and Budesonide, the equivalent of Symbicort, per neb. and ov for med rec, did neither. January 08, 2011 ov "so you can get her off prednisone".  did none of the previousl recs, not clear who is refilling her prednisone,  daughter is again thoroughly confused with meds/ nebs etc.  pt not sob or coughing but still on pred 10 mg daily.  >>no changes   January 27, 2011 --Returns for follow up and med review. We went over her meds today. Daughter is with pt today, she administers her meds dialy. She has a one slot pill box.We discussed need to get at least a 2 slot organizer to help with distribution of meds. We divided up her  pills into am and at bedtime schedule to help with ease. She does take her second dose of neb at dinner time/late evening. Will leave albutreol for now to help with simplicity. Daughter is full caregiver for pt who has advanced  dementia. She has had mild flare of cough/wheezing over last 2 day, better today. Discussed chronic steroids and currently floor is 10mg  once daily.Denies chest pain, dyspnea, orthopnea, hemoptysis, fever, n/v/d, edema, headache.       Past History:  Past Surgical History: Last updated: 04/09/2010 Cholecystectomy Lithotripsy Rt knee surgery  Family History: Last updated: 11/08/2007 Family History of Arthritis Family History Hypertension Family History Lung cancer  Social History: Last updated: 11/08/2007 Single Never Smoked Alcohol use-no Drug use-no  Past Medical History: Asthma, sees Dr. [name] but poor adherence documented January 08, 2011     - On daily prednsione since 2009  Hypertension Osteoporosis GERD Dementia Pulmonary embolism, hx of 4-09, sees Dr. [name] leg edema Complex med regimen--med calendar August 24, 2009 (daughter  doesn't remember this  @ ov January 03, 2010 ),January 27, 2011   Flu shot-- August 24, 2009  ECHO 12-2008 showed EF of 70%   Review of Systems      See HPI   Physical Exam  Additional Exam:  GEN: A/Ox3 pleasant, mild confusion.  wt  163 > 157 July 20, 2009 >>161 > 157 February  3, >>? January 27, 2011  Mod  cushingnoid facies child like affect  HEENT:  [place]/AT, , EACs-clear, TMs-wnl, NOSE-clear, THROAT-clear NECK:  Supple w/ fair ROM; no JVD; normal carotid impulses w/o bruits; no thyromegaly or nodules palpated; no lymphadenopathy. CHEST:  Clear to P & A; w/o, wheezes/ rales/ or rhonchi. HEART:  RRR, no m/r/g   ABDOMEN:  Soft & nt; nml bowel sounds; no organomegaly or masses detected. EXT: Warm bil,  no calf pain, edema, clubbing, pulses intact Neuro: confused, but very pleasant.     Impression & Recommendations:  Problem # 1:  ASTHMA (ICD-493.90) Compensated on present regimen We discussed meds in detail  will stay on current regimen to help with simplicity for pt and daughter given  pt dx of severe dementia.    if ok on return consider decreasing prednisone to 5mg  , would avoid tapering dose for pt/daughter to manage.  Medications Added to Medication List This Visit: 1)  Multivitamins Tabs (Multiple vitamin) .... Take 1 tab by mouth at bedtime 2)  Vitamin B-12 1000 Mcg Tabs (Cyanocobalamin) .... Take 1 tab by mouth at bedtime 3)  Albuterol Sulfate (2.5 Mg/61ml) 0.083% Nebu (Albuterol sulfate) .Marland Kitchen.. 1 vial in nebulizer two times a day 4)  Prednisone 10 Mg Tabs (Prednisone) .Marland Kitchen.. 1 once daily or see bottom of medication calendar for specific tapering instructions 5)  Nexium 40 Mg Cpdr (Esomeprazole magnesium) .... Take 1 capsule by mouth once a day before breakfast 6)  Tums E-x 750 750 Mg Chew (Calcium carbonate antacid) .Marland Kitchen.. 1 in the morning and 1 at bedtime 7)  Vitamin C 500  Mg Tabs (Ascorbic acid) .... Take 1 tablet by mouth once a day 8)  Chewable Calcium 1250 Mg Chew (Calcium carbonate) .Marland Kitchen.. 1 chew once daily 9)  Albuterol Sulfate (2.5 Mg/55ml) 0.083% Nebu (Albuterol sulfate) .Marland Kitchen.. 1 neb every 3 hours as needed 10)  Mucinex Dm 30-600 Mg Tb12 (Dextromethorphan-guaifenesin) .... Take 1-2 tablets every 12 hours as needed 11)  Prednisone 10 Mg Tabs (Prednisone) .... Increase to 2 tabs by mouth daily until back to baseline, then back to 1 tab daily  Other Orders: Est. Patient Level III (16109)   Patient Instructions: 1)  Please get a  pill organizer box that has two slots for am and at bedtime  2)  Follow med calendar closely and birng to each visit.  3)  follow up Dr. [name] in  3 months and as needed  CC: est med calendar, pt brought all meds with her today - daughter reports increased asthma attacks x1week Is Patient Diabetic? No Comments Medications reviewed with patient Daytime contact number verified with patient. Boone Master CNA/MA  January 27, 2011 12:02 PM    Medications Prior to Update: 1)  Multivitamins   Tabs (Multiple Vitamin) .... Take One Tab By Mouth Once Daily 2)   Singulair 10 Mg  Tabs (Montelukast Sodium) .... Take One Tab By Mouth Once Daily 3)  Namenda 10 Mg  Tabs (Memantine Hcl) .... Take One Tab By Mouth Two Times A Day 4)  Klor-Con 10 10 Meq Cr-Tabs (Potassium Chloride) .... 2 Tabs Twice A Day 5)  Pulmicort 0.5 Mg/69ml  Susp (Budesonide (Inhalation)) .... Use Two Times A Day 6)  Albuterol Sulfate (2.5 Mg/65ml) 0.083%  Nebu (Albuterol Sulfate) .Marland Kitchen.. 1 Neb Evey 4 Hours If Needed 7)  Flonase 50 Mcg/act  Susp (Fluticasone Propionate) .... Use Two Sprays in Each Nostril Two Times A Day-Ran Out 8)  Prednisone 10 Mg  Tabs (Prednisone) .Marland Kitchen.. 1 Once Daily 9)  Tums E-X 750 750 Mg Chew (Calcium Carbonate Antacid) .... Three Times A Day With Meals As Needed 10)  Vitamin D 2000 Unit Tabs (Cholecalciferol) .... Take 1 Tablet By Mouth Once A Day 11)  Furosemide 40 Mg Tabs (Furosemide) .... Take 1 Tablet By Mouth Once A Day 12)  Mucinex Dm 30-600 Mg  Tb12 (Dextromethorphan-Guaifenesin) .Marland Kitchen.. 1-2 Two Times A Day As Needed 13)  Hydroxyzine Hcl 25 Mg Tabs (Hydroxyzine Hcl) .Marland Kitchen.. 1 By Mouth Every 4 Hours As Needed For Itching 14)  Citracal Plus  Tabs (Multiple Minerals-Vitamins) .... Take 1 Tablet By Mouth Two Times A Day 15)  Triamcinolone Acetonide 0.1 % Crea (Triamcinolone Acetonide) .... Apply Two Times A Day As Needed 16)  Brovana 15 Mcg/54ml  Nebu (Arformoterol Tartrate) .... One in Nebulizer Twice Daily With Budesonide 17)  Azelastine Hcl 0.05 % Soln (Azelastine Hcl) .Marland Kitchen.. 1 Gtt in Each Eye Two Times A Day 18)  Aspirin 81 Mg Tabs (Aspirin) .... Once Daily  Current Medications (verified): 1)  Multivitamins   Tabs (Multiple Vitamin) .... Take 1 Tab By Mouth At Bedtime 2)  Vitamin B-12 1000 Mcg Tabs (Cyanocobalamin) .... Take 1 Tab By Mouth At Bedtime 3)  Singulair 10 Mg  Tabs (Montelukast Sodium) .... Take One Tab By Mouth Once Daily 4)  Namenda 10 Mg  Tabs (Memantine Hcl) .... Take One Tab By Mouth Two Times A Day 5)  Klor-Con 10 10 Meq Cr-Tabs (Potassium Chloride)  .... 2 Tabs Twice A Day 6)  Pulmicort 0.5 Mg/63ml  Susp (Budesonide (Inhalation)) .... Use Two Times  A Day 7)  Albuterol Sulfate (2.5 Mg/46ml) 0.083%  Nebu (Albuterol Sulfate) .Marland Kitchen.. 1 Vial in Nebulizer Two Times A Day 8)  Flonase 50 Mcg/act  Susp (Fluticasone Propionate) .... Use Two Sprays in Each Nostril Two Times A Day-Ran Out 9)  Prednisone 10 Mg  Tabs (Prednisone) .Marland Kitchen.. 1 Once Daily or See Bottom of Medication Calendar For Specific Tapering Instructions 10)  Nexium 40 Mg Cpdr (Esomeprazole Magnesium) .... Take 1 Capsule By Mouth Once A Day Before Breakfast 11)  Tums E-X 750 750 Mg Chew (Calcium Carbonate Antacid) .Marland Kitchen.. 1 in The Morning and 1 At Bedtime 12)  Vitamin D 2000 Unit Tabs (Cholecalciferol) .... Take 1 Tablet By Mouth Once A Day 13)  Furosemide 40 Mg Tabs (Furosemide) .... Take 1 Tablet By Mouth Once A Day 14)  Vitamin C 500 Mg Tabs (Ascorbic Acid) .... Take 1 Tablet By Mouth Once A Day 15)  Chewable Calcium 1250 Mg Chew (Calcium Carbonate) .Marland Kitchen.. 1 Chew Once Daily 16)  Albuterol Sulfate (2.5 Mg/48ml) 0.083% Nebu (Albuterol Sulfate) .Marland Kitchen.. 1 Neb Every 3 Hours As Needed 17)  Mucinex Dm 30-600 Mg  Tb12 (Dextromethorphan-Guaifenesin) .... Take 1-2 Tablets Every 12 Hours As Needed 18)  Hydroxyzine Hcl 25 Mg Tabs (Hydroxyzine Hcl) .Marland Kitchen.. 1 By Mouth Every 4 Hours As Needed For Itching 19)  Prednisone 10 Mg Tabs (Prednisone) .... Increase To 2 Tabs By Mouth Daily Until Back To Baseline, Then Back To 1 Tab Daily 20)  Azelastine Hcl 0.05 % Soln (Azelastine Hcl) .Marland Kitchen.. 1 Gtt in Each Eye Two Times A Day  Allergies (verified): No Known Drug Allergies  Orders Added: 1)  Est. Patient Level III [88416]

## 2011-02-10 ENCOUNTER — Encounter: Payer: Self-pay | Admitting: Family Medicine

## 2011-02-10 LAB — COMPREHENSIVE METABOLIC PANEL
AST: 29 U/L (ref 0–37)
Albumin: 3.2 g/dL — ABNORMAL LOW (ref 3.5–5.2)
BUN: 17 mg/dL (ref 6–23)
Calcium: 9.5 mg/dL (ref 8.4–10.5)
Chloride: 110 mEq/L (ref 96–112)
Creatinine, Ser: 1.33 mg/dL — ABNORMAL HIGH (ref 0.4–1.2)
GFR calc Af Amer: 47 mL/min — ABNORMAL LOW (ref >60)
Total Protein: 5.8 g/dL — ABNORMAL LOW (ref 6.0–8.3)

## 2011-02-10 LAB — CBC
MCH: 28 pg (ref 26.0–34.0)
MCV: 89.1 fL (ref 78.0–100.0)
Platelets: 138 10*3/uL — ABNORMAL LOW (ref 150–400)
RBC: 5.04 MIL/uL (ref 3.87–5.11)
RDW: 15.5 % (ref 11.5–15.5)

## 2011-02-10 LAB — DIFFERENTIAL
Basophils Absolute: 0 10*3/uL (ref 0.0–0.1)
Eosinophils Relative: 1 % (ref 0–5)
Lymphocytes Relative: 14 % (ref 12–46)
Lymphs Abs: 1.5 10*3/uL (ref 0.7–4.0)
Monocytes Absolute: 0.5 10*3/uL (ref 0.1–1.0)
Monocytes Relative: 5 % (ref 3–12)
Neutro Abs: 8.5 10*3/uL — ABNORMAL HIGH (ref 1.7–7.7)

## 2011-02-10 LAB — POCT CARDIAC MARKERS
CKMB, poc: <1 ng/mL — ABNORMAL LOW (ref 1.0–8.0)
Myoglobin, poc: 57.4 ng/mL (ref 12–200)
Troponin i, poc: <0.05 ng/mL (ref 0.00–0.09)

## 2011-02-10 LAB — APTT: aPTT: 35 seconds (ref 24–37)

## 2011-02-10 LAB — PROTIME-INR: INR: 2.08 — ABNORMAL HIGH (ref 0.00–1.49)

## 2011-02-11 ENCOUNTER — Ambulatory Visit (INDEPENDENT_AMBULATORY_CARE_PROVIDER_SITE_OTHER): Payer: Medicare PPO | Admitting: Family Medicine

## 2011-02-11 ENCOUNTER — Encounter: Payer: Self-pay | Admitting: Family Medicine

## 2011-02-11 VITALS — BP 130/84 | HR 86 | Temp 97.2°F | Wt 146.0 lb

## 2011-02-11 DIAGNOSIS — N390 Urinary tract infection, site not specified: Secondary | ICD-10-CM

## 2011-02-11 DIAGNOSIS — R55 Syncope and collapse: Secondary | ICD-10-CM

## 2011-02-11 NOTE — Progress Notes (Signed)
  Subjective:    Patient ID: [name], female    DOB: December 25, 1928, 77 y.o.   MRN: [id number]  HPI Here to follow up an ER visit on 01-31-11 for a mental status change. At home she was unresponsive and limp for a few minutes and was taken to the ER. She aroused quickly and has been at her mental baseline ever since. Her workup there revealed a UTI only. She had CT scans of the head and the abdomen that showed no acute processes. She was given a shot of Rocephin and sent home. Her urine culture grew Group B strep that day. Since then she has been at her baseline at home, and she appears to be fine. She drinks plenty of water.    Review of Systems  Constitutional: Negative.   Respiratory: Negative.   Cardiovascular: Negative.   Gastrointestinal: Negative.   Neurological: Negative for seizures, syncope, speech difficulty and weakness.       Objective:   Physical Exam  Constitutional: She appears well-developed and well-nourished.  Eyes: Pupils are equal, round, and reactive to light.  Neck: Normal range of motion. Neck supple. No thyromegaly present.  Cardiovascular: Normal rate, regular rhythm, normal heart sounds and intact distal pulses.  Exam reveals no gallop and no friction rub.   No murmur heard. Pulmonary/Chest: Effort normal and breath sounds normal. No respiratory distress. She has no wheezes. She has no rales. She exhibits no tenderness.  Abdominal: Soft. Bowel sounds are normal. She exhibits no distension and no mass. There is no tenderness. There is no rebound and no guarding.  Lymphadenopathy:    She has no cervical adenopathy.  Neurological: She is alert. She exhibits normal muscle tone. Coordination normal.       At her baseline , oriented to self           Assessment & Plan:  She seems to be over the UTI, but she was unable to produce a sample today. Her daughter will take a sample cup home to get one and will bring it back later today to be tested. It sounds to  me like the patient had a TIA the other day, and the UTI may or may not have had anything to do with it. She will continue on an aspirin daily.

## 2011-02-11 NOTE — Progress Notes (Signed)
Summary: rx  Phone Note Call from Patient Call back at Home Phone [phone]   Caller: Daughter [name] Call For: wert Summary of Call: caller says the rx for flonase needs to [facility] [address].  Initial call taken by: Tivis Ringer, CNA,  February 05, 2011 12:28 PM  Follow-up for Phone Call        Rx was sent to pharm.  LMOM for pt to be made aware this was done.  Follow-up by: Vernie Murders,  February 05, 2011 2:06 PM    Prescriptions: Aleda Grana 50 MCG/ACT  SUSP (FLUTICASONE PROPIONATE) use two sprays in each nostril two times a day-RAN OUT  #1 x 11   Entered by:   Vernie Murders   Authorized by:   Nyoka Cowden MD   Signed by:   Vernie Murders on 02/05/2011   Method used:   Electronically to        [facility]  [phone]* (retail)       [address] [place], [place]  [address]       Ph: [phone] or [phone]       Fax: [phone]   RxID:   [id number]

## 2011-02-12 LAB — POCT URINALYSIS DIPSTICK
Glucose, UA: NEGATIVE
Spec Grav, UA: 1.02
Urobilinogen, UA: 1
pH, UA: 6

## 2011-02-12 NOTE — Progress Notes (Signed)
Addended by: Rita Ohara on: 02/12/2011 04:58 PM   Modules accepted: Orders

## 2011-02-13 ENCOUNTER — Telehealth: Payer: Self-pay

## 2011-02-13 NOTE — Telephone Encounter (Signed)
Message copied by Madison Hickman on Thu Feb 13, 2011 11:22 AM ------      Message from: Dwaine Deter      Created: Thu Feb 13, 2011  5:39 AM       Negative for infection

## 2011-02-13 NOTE — Telephone Encounter (Signed)
[name] daughter aware of results

## 2011-02-17 ENCOUNTER — Telehealth: Payer: Self-pay | Admitting: *Deleted

## 2011-02-17 NOTE — Telephone Encounter (Signed)
Pt here last week with hematuria, and now has UTI symptoms, back pain, abdominal pain and some difficulty urinating.  Phone cut off and could not get anymore information.

## 2011-02-20 MED ORDER — CIPROFLOXACIN HCL 500 MG PO TABS: 500.0000 mg | ORAL_TABLET | Freq: Two times a day (BID) | ORAL | Status: AC

## 2011-02-20 NOTE — Telephone Encounter (Signed)
rx sent in  - daughter aware 

## 2011-02-20 NOTE — Telephone Encounter (Signed)
Call in Cipro 500 mg bid for 10 days  

## 2011-02-20 NOTE — Telephone Encounter (Signed)
Addended by: Madison Hickman on: 02/20/2011 03:49 PM   Modules accepted: Orders

## 2011-03-07 LAB — CBC
HCT: 39.8 % (ref 36.0–46.0)
MCHC: 32.1 g/dL (ref 30.0–36.0)
MCV: 85.8 fL (ref 78.0–100.0)
Platelets: 141 10*3/uL — ABNORMAL LOW (ref 150–400)
RDW: 15.3 % (ref 11.5–15.5)

## 2011-03-07 LAB — CK TOTAL AND CKMB (NOT AT ARMC)
CK, MB: 1.6 ng/mL (ref 0.3–4.0)
Relative Index: INVALID (ref 0.0–2.5)
Total CK: 89 U/L (ref 7–177)

## 2011-03-07 LAB — BASIC METABOLIC PANEL
BUN: 16 mg/dL (ref 6–23)
Chloride: 104 mEq/L (ref 96–112)
Glucose, Bld: 131 mg/dL — ABNORMAL HIGH (ref 70–99)
Potassium: 4.5 mEq/L (ref 3.5–5.1)

## 2011-03-07 LAB — DIFFERENTIAL
Basophils Absolute: 0 10*3/uL (ref 0.0–0.1)
Basophils Relative: 0 % (ref 0–1)
Eosinophils Absolute: 0.3 10*3/uL (ref 0.0–0.7)
Eosinophils Relative: 1 % (ref 0–5)

## 2011-03-07 LAB — BRAIN NATRIURETIC PEPTIDE: Pro B Natriuretic peptide (BNP): 218 pg/mL — ABNORMAL HIGH (ref 0.0–100.0)

## 2011-03-07 LAB — URINALYSIS, ROUTINE W REFLEX MICROSCOPIC
Ketones, ur: NEGATIVE mg/dL
Nitrite: NEGATIVE
Protein, ur: 30 mg/dL — AB
pH: 5 (ref 5.0–8.0)

## 2011-03-07 LAB — URINE MICROSCOPIC-ADD ON

## 2011-03-07 LAB — URINE CULTURE

## 2011-03-07 LAB — TROPONIN I: Troponin I: 0.04 ng/mL (ref 0.00–0.06)

## 2011-03-11 LAB — URINALYSIS, ROUTINE W REFLEX MICROSCOPIC
Bilirubin Urine: NEGATIVE
Ketones, ur: NEGATIVE mg/dL
Nitrite: NEGATIVE
Specific Gravity, Urine: 1.021 (ref 1.005–1.030)
Urobilinogen, UA: 1 mg/dL (ref 0.0–1.0)

## 2011-03-11 LAB — DIFFERENTIAL
Basophils Absolute: 0 10*3/uL (ref 0.0–0.1)
Lymphocytes Relative: 21 % (ref 12–46)
Monocytes Absolute: 0.5 10*3/uL (ref 0.1–1.0)
Neutro Abs: 4.8 10*3/uL (ref 1.7–7.7)

## 2011-03-11 LAB — COMPREHENSIVE METABOLIC PANEL
Albumin: 2.9 g/dL — ABNORMAL LOW (ref 3.5–5.2)
BUN: 15 mg/dL (ref 6–23)
Calcium: 8.6 mg/dL (ref 8.4–10.5)
Chloride: 111 mEq/L (ref 96–112)
Creatinine, Ser: 1.36 mg/dL — ABNORMAL HIGH (ref 0.4–1.2)
Total Bilirubin: 1 mg/dL (ref 0.3–1.2)

## 2011-03-11 LAB — CBC
HCT: 41.2 % (ref 36.0–46.0)
MCHC: 32.1 g/dL (ref 30.0–36.0)
MCV: 86.7 fL (ref 78.0–100.0)
Platelets: 156 10*3/uL (ref 150–400)
RDW: 15.4 % (ref 11.5–15.5)
WBC: 6.7 10*3/uL (ref 4.0–10.5)

## 2011-03-11 LAB — PROTIME-INR: INR: 2.3 — ABNORMAL HIGH (ref 0.00–1.49)

## 2011-03-11 LAB — APTT: aPTT: 43 seconds — ABNORMAL HIGH (ref 24–37)

## 2011-03-11 LAB — LIPASE, BLOOD: Lipase: 19 U/L (ref 11–59)

## 2011-04-15 NOTE — Discharge Summary (Signed)
NAMEALLESANDRA, HUEBSCH             ACCOUNT NO.:  [id number]   MEDICAL RECORD NO.:  [id number]          PATIENT TYPE:  INP   LOCATION:  1433                         FACILITY:  [facility]   PHYSICIAN:  Valerie A. Felicity Coyer, MDDATE OF BIRTH:  04/16/1929   DATE OF ADMISSION:  01/23/2008  DATE OF DISCHARGE:  01/27/2008                               DISCHARGE SUMMARY   DISCHARGE DIAGNOSES:  1. Acute asthma exacerbation with underlying bronchitis, improved;      outpatient medication taper with follow up as below.  2. Fever with hypotension on admission due to the above, resolved.      Continue outpatient antibiotics for a total of a 10-day course.  3. Gastroesophageal reflux disease history.  4. Dementia.  5. Mild acute renal insufficiency due to probable dehydration with      hypoperfusion, resolved.  Discharge creatinine of 1.0.  6. Hyperglycemia exacerbated by steroids.  No history of diabetes,      hemoglobin A-1-C of 5.5.  Outpatient follow up as needed.  7. Mild hypokalemia; replacement ordered and resolved.   DISCHARGE MEDICATIONS:  The discharge medications include:  1. Avelox 100 mg once daily times seven additional days; dispense      seven, prescription provided.  2. Prednisone taper 10 mg tablets; instructions 6 tablets daily x2      days, then 5 tablets daily x3 days, then 4 tablets daily x3 days,      then 3 tablets daily x3 days, then 2 tablets daily x3 days, then 1      tablet daily x3 days, then 1/2 tablet daily x4 days, and then stop;      prescription provided, dispense 65.   Other medications are as prior to admission and include:  1. Nexium 40 mg once daily.  2. Singulair 10 mg once daily.  3. Claritin 10 mg once daily.  4. Hydroxyzine 50 mg every 4 hours as needed.  5. Namenda 10 mg twice a day.  6. Hydrochlorothiazide 12.5 mg once daily.  7. Albuterol/Atrovent inhaler 2 puffs every 4 hours while awake.   FOLLOW UP:  Hospital follow up is scheduled next week with  her primary  care physician, Dr. Gershon Crane, for Tuesday, February 08, 2008, at 3:15  P.M.  They are instructed to please reschedule the appointment with Dr.  Clent Ridges if they are unable to make this scheduled time as there will be a no  show fee if the appointment is missed and not rescheduled.  The patient  was also encouraged to reschedule with Dr. [name], her pulmonologist, and  to call for an appointment in 3-4 weeks for routine follow up.   DISCHARGE CONDITION:  The condition on discharge is medically stable and  improved.  The patient was hemodynamically stable with an O2 sat of 96%  on room air and no respiratory distress.   HOSPITAL COURSE:  Problem:  Acute asthma exacerbation with bronchitis.  The patient is a 77 year old woman who was brought to the emergency room  early on January 24, 2008 due to shortness of breath.  She was noted to  have a temperature  of 103 on presentation to the emergency room.  She  was given a number of nebulized breathing treatments as well as Solu-  Medrol and developed hypotension of 90/50 while in the ER, thus  confirming the need for hospitalization.  Her chest x-ray was generally  unremarkable without evidence of infiltration or effusion, but she was  continued on empiric antibiotics for possible early sepsis or pneumonia.   Over the next 24 hours with IV fluids, continued nebulized treatments  and aggressive steroid therapy the patient did stabilize with  improvement in her shortness of breath symptoms.  She had no recurrent  fever or hypotension during this hospitalization; and, remained improved  and stable each day.  She was seen by PT and OT on the day prior to  discharge who felt that she had no physical needs other than the  possible need for more supervision due to cognitive deficits of which  the daughter was aware, and agreed that she would likely stay home with  the patient for the next week following discharge home to ensure  normalization  of the patient's usual schedule and routine.  She will  continue a prednisone taper as described as well as antibiotic therapy  for a total of 10 days with prescriptions provided.  Her other  medications are those as prior to admission without changes as listed.  Blood culture obtained times one in the emergency room remained with no  growth to date; and, again there was no evidence of actual pneumonia  rather than probable acute bronchitis causing exacerbation.   NOTE:  Greater than 30 minutes was spent on discharge planning.      Valerie A. Felicity Coyer, MD  Electronically Signed     VAL/MEDQ  D:  01/27/2008  T:  01/28/2008  Job:  [id number]

## 2011-04-15 NOTE — H&P (Signed)
NAMEDANIALLE, DEMENT             ACCOUNT NO.:  [id number]   MEDICAL RECORD NO.:  [id number]          PATIENT TYPE:  INP   LOCATION:  1426                         FACILITY:  [facility]   PHYSICIAN:  Gardiner Barefoot, MD    DATE OF BIRTH:  [birth year]   DATE OF ADMISSION:  03/24/2008  DATE OF DISCHARGE:                              HISTORY & PHYSICAL   PRIMARY CARE PHYSICIAN:  [facility].   CHIEF COMPLAINTS:  Hemoptysis.   HISTORY OF PRESENT ILLNESS:  This is a 77 year old female with history  of asthma and dementia, has 2 days of hemoptysis.  The patient was  brought in for evaluation, after noting coughing up some blood this  afternoon.  Workup with CT angio was revealing of a pulmonary embolism  the left lower lobe.  The patient has never been a smoker, does not know  of any family history of clots in her family and does not have any known  cancer.   PAST MEDICAL HISTORY:  1. Dementia.  2. Asthma.   MEDICATIONS:  Include:  1. Nexium 40 mg daily.  2. Singulair 10 mg daily.  3. Hydroxyzine 10 mg q.4 p.r.n.  4. Namenda 10 mg b.i.d.  5. Albuterol p.r.n.  6. Prednisone 10 mg daily.   ALLERGIES:  NO KNOWN DRUG ALLERGIES.   FAMILY HISTORY:  No hematologic disorders known.   SOCIAL HISTORY:  No history of any tobacco, alcohol or drugs.   REVIEW OF SYSTEMS:  Is negative, except as per the history of present  illness.   PHYSICAL EXAM:  VITALS:  Temperature is 99.7, pulse is 88, respirations  18, blood pressure is 116/77, O2 sats 97%.  GENERAL:  The patient is awake and alert and however is not oriented,  which the daughter reports as her baseline.  CARDIOVASCULAR:  Regular rate and rhythm with no murmurs, rubs or  gallops.  LUNGS:  Clear to auscultation bilaterally.  ABDOMEN:  Soft, nontender, nondistended with positive bowel sounds and  no hepatosplenomegaly.  EXTREMITIES:  No cyanosis, clubbing or edema.   LABORATORY DATA:  UA is negative, sodium 138, potassium  4.4, chloride  103, bicarb 29, BUN 14, creatinine 1.08, glucose 111.  WBC is 11,  hemoglobin 13, platelets 156.  CT angio with left lower lobe pulmonary  embolism.   IMPRESSION AND PLAN:  1. Pulmonary embolism.  Will the patient has never had a any episodes      of bleeding and therefore will start the patient on Lovenox and      Coumadin per pharmacy.  The patient likely will need 6 months of      therapy, and this was discussed with the patient and the patient's      daughter who is at the bedside.  Also I discussed with the daughter      as well, as the patient has had recent falls at her home,  that      this can cause some problems in somebody with Coumadin, including      bleeding particularly intracranial and that they need to be sure to  monitor closely around the house and maximize the environment to      keep her from having obstacles that she could fall on.  2. Asthma.  Will continue the patient daily prednisone and inhaled      albuterol as needed.  Also with Singulair.  3. Dementia.  Will continue with Namenda.      Gardiner Barefoot, MD  Electronically Signed     RWC/MEDQ  D:  03/24/2008  T:  03/25/2008  Job:  [id number]

## 2011-04-15 NOTE — Assessment & Plan Note (Signed)
[facility]                            CARDIOLOGY OFFICE NOTE   Kristen, [name]                    MRN:          [id number]  DATE:01/04/2009                            DOB:          [birth year]    [name] is seen for followup.  See my complete note of December 27, 2008.  Her diuretic dose had been at a higher level with some recent  prednisone.  She had diuresed.  We checked BMET, her creatinine was up  to 1.3.  Two-dimensional echo was done.  She actually has vigorous LV  function with EF in the range of 70%.  There was excellent motion.  There was mild aortic valvular disease.  Several days ago, she had some  dizziness.  On that day, her daughter gave her extra fluids.   Overall, I believe she is stable.   ALLERGIES:  No known drug allergies.   MEDICATIONS:  See the flow sheet.   REVIEW OF SYSTEMS:  She did not have any GI or GU symptoms.  Today, she  has no fevers or chills or headache or skin rashes.  Otherwise, her  review of systems is negative.   PHYSICAL EXAMINATION:  VITAL SIGNS:  Blood pressure today is 136/87.  Pulse is 87.  GENERAL:  The patient is oriented to person.  She is here with her  daughter.  HEENT:  No xanthelasma.  She has normal extraocular motions.  NECK:  There are no carotid bruits.  There is no jugular venous  distention.  LUNGS:  Clear.  Respiratory effort is not labored.  CARDIAC:  S1 with an S2.  ABDOMEN:  Soft.  EXTREMITIES:  She has no significant edema.   Echo results shows an ejection fraction of 70% with no significant  valvular abnormalities.   Problems are listed on the note of December 27, 2008.  The patient had  excellent LV function.  She has some mild edema from time to time.  Some  of this may be affected by prednisone dosing.  With the increase of  Lasix up to 80, she diuresed.  She may be slightly dry now.  I have  encouraged her daughter to put her back to 40 mg daily.  She can use  80  mg as needed if she has any return of edema.     Luis Abed, MD, [facility]  Electronically Signed    JDK/MedQ  DD: 01/04/2009  DT: 01/05/2009  Job #: [id number]

## 2011-04-15 NOTE — Assessment & Plan Note (Signed)
[facility]                            CARDIOLOGY OFFICE NOTE   DESANI, SPRUNG                    MRN:          [id number]  DATE:12/27/2008                            DOB:          1929/01/03    Ms. Pavelko is referred by Dr. Clent Ridges for the evaluation of her cardiac  status.  She has had some edema over time.  More recently, it became  worse.  She is here with her daughter today.  Dr. Clent Ridges adjusted her  diuretic dose up and this has definitely helped with her edema.  Her  daughter was giving her some extra fluids to try to help her and I  explained that we would not want her to do this at this time.  Her  creatinine had been in the range of 1.19 when on 40 mg of Lasix.  I do  not have a repeat of BUN and creatinine with her on 80 mg of Lasix.   The patient does not have history of prior cardiac workup.  She is  followed carefully by Dr. Clent Ridges and she is also followed by Dr. [name] for  her pulmonary disease.  She has significant asthma.  She had a pulmonary  embolus that was diagnosed in April 2009.   PAST MEDICAL HISTORY:   MEDICATIONS:  1. Singulair.  2. Prednisone 10 mg daily.  3. Nexium 40.  4. Furosemide currently at 80 mg daily.  5. Warfarin as directed.  6. Namenda.  7. Potassium 20 mEq b.i.d.   OTHER MEDICAL PROBLEMS:  See the complete list below.   SOCIAL HISTORY:  The patient has no history of tobacco or alcohol.  She  does have significant memory loss and dementia, although I do not know  all of the specifics.   FAMILY HISTORY:  Noncontributory at this time.   REVIEW OF SYSTEMS:  The patient is not having any significant GI or GU  symptoms at this time.  She is not having any headaches.  There is no  skin rash.  There are no fevers or chills.  Her review of systems is  negative otherwise.   PHYSICAL EXAMINATION:  VITAL SIGNS:  Blood pressure is 133/78 with a  pulse of 88.  GENERAL:  The patient is oriented to person.  Most of  the history comes  from her daughter.  HEENT:  No xanthelasma.  She has normal extraocular motion.  NECK:  There are no carotid bruits.  There is no jugular venous  distention.  LUNGS:  Clear.  Respiratory effort is not labored.  CARDIAC:  S1 with an S2.  There are no clicks or significant murmurs at  this time.  ABDOMEN:  Soft.  EXTREMITIES:  As of today, she has no significant peripheral edema.   EKG reveals increased voltage compatible with LVH.  She has nonspecific  ST-T wave changes.  The rhythm is sinus.  Recent labs reveal that, dated  December 15, 2008, her BUN was 20, creatinine 1.19, hemoglobin was 13.1,  INR was 1.7, sodium was 148.  TSH was 2.2.   Problems include,  1.  History of asthma.  2. Hypertension.  3. Osteoporosis.  4. Gastroesophageal reflux disease.  5. Dimension.  6. History of a pulmonary embolism in April 2009.  7. Coumadin therapy for her pulmonary edema.  8. Status post cholecystectomy, lithotripsy, and right knee surgery.  9. Peripheral edema.   With the patient on prednisone, it is possible that she keeps summer of  her fluid on the basis of her steroids.  I do not know what her LV  function.  Her renal function has been stable, although BMET needs to be  repeated on the higher dose of furosemide.   We will proceed with a 2D echo to assess her LV function better.  I will  have the patient's daughter keep her on Lasix 80 mg daily and we will  recheck the BMET today to be sure that her renal function and potassium  are stable.  I will see her for early followup to make the next  decisions as to whether or not we should adjust anything further.     Luis Abed, MD, [facility]  Electronically Signed    JDK/MedQ  DD: 12/27/2008  DT: 12/28/2008  Job #: [id number]   cc:   [name], MD

## 2011-04-15 NOTE — Discharge Summary (Signed)
NAMECLAUDIE, [name]             ACCOUNT NO.:  [id number]   MEDICAL RECORD NO.:  [id number]          PATIENT TYPE:  INP   LOCATION:  1426                         FACILITY:  [facility]   PHYSICIAN:  Valerie A. Felicity Coyer, MDDATE OF BIRTH:  1929/05/05   DATE OF ADMISSION:  03/24/2008  DATE OF DISCHARGE:  03/28/2008                               DISCHARGE SUMMARY   DISCHARGE DIAGNOSES:  1. Acute right lower lobe pulmonary embolism with hemoptysis on admit.  2. Hypokalemia.  3. Dementia.  4. Mild anemia.   HISTORY OF PRESENT ILLNESS:  [name] is a very pleasant 77 year old  demented African American female with a history of asthma who was  brought to the emergency room on day of admission by her daughter due to  coughing up blood.  The daughter reported noticing hemoptysis on morning  of admission.  Denied any recent fever, shortness of breath or chest  pain.  Upon evaluation in the ER, the patient with stable vital signs,  lung sounds clear to auscultation bilaterally; however, CT angiogram  obtained secondary to hemoptysis revealed an acute right lower lobe PE.  The patient was admitted at that time for anticoagulation and further  evaluation.   PAST MEDICAL HISTORY:  1. Dementia.  2. Asthma.  3. GERD.   COURSE OF HOSPITALIZATION:  1. Acute right lower lobe PE with hemoptysis.  The patient admitted to      telemetry unit and started on anticoagulation per pharmacy with      Lovenox bridge.  [name] will likely need a total of 6 months of      anticoagulation therapy.  At time of discharge, the patient has      been without any shortness of breath or chest pain throughout      hospitalization.  The patient's daughter Kristen Braun has been instructed      on Lovenox injections as well as any potential side effects of      anticoagulation.  In addition, the patient's oxygen saturation has      remained stable throughout hospitalization.  The patient with 24-      hour care at home by  her son and daughter, therefore, the patient      felt medically stable for discharge home for continued Lovenox      bridge and Coumadin.  The daughter instructed that the patient      needs to follow up with primary care physician, Dr. Gershon Crane, on      Friday after  discharge for a repeat INR to determine length of      Lovenox injections.  2. Mild hypokalemia.  The patient noted to be hypokalemic on admit.      That has resolved status post p.o. replete.  The patient is on      diuretic at home, therefore low-dose potassium chloride added to      daily medication list.  3. Dementia.  The patient to continue on Namenda.  As mentioned above,      the patient with 24-hour supervision at home, therefore, felt      medically stable  for discharge.  4. Mild anemia.  The patient's anemia panel done during this admission      within normal limits with no acute blood loss on exam.  The      patient's anemia likely secondary to problem number one and stable      at time of discharge.   MEDICATIONS AT THE TIME OF DISCHARGE:  1. Coumadin 6 mg p.o. daily unless otherwise instructed.  2. Lovenox 90 mg subcu daily until instructed to stop.  3. Nexium 10 mg p.o. daily.  4. Namenda 10 mg p.o. b.i.d.  5. Prednisone 10 mg daily.  6. Hydroxyzine 10 mg p.o. q.4 h p.r.n.  7. HCTZ 12.5 mg p.o. daily.  8. DuoNebs 2 puffs q.4 h while awake.  9. Potassium chloride 10 mEq p.o. daily.   PERTINENT LABORATORY WORK AT TIME OF DISCHARGE:  White cell count 8.8,  platelet count 179, hemoglobin 12.1, hematocrit 37.4 with sodium 140,  potassium 3.6, BUN 14, creatinine 0.81, INR 1.5.  Vital signs stable  with O2 sat of 100% on room air.   DISPOSITION:  As mentioned above, the patient felt medically stable for  discharge home with close supervision by son and daughter.  The patient  to follow up with her primary care physician, Dr. Gershon Crane, on May 1  at 2:30 p.m., at which time her INR will need to be  rechecked to  determine length of  Lovenox treatment.      Kristen Pen, NP      Raenette Rover. Felicity Coyer, MD  Electronically Signed    LE/MEDQ  D:  03/28/2008  T:  03/28/2008  Job:  [id number]   cc:   [name], MD  [address] [place]  [place] [address]

## 2011-04-15 NOTE — H&P (Signed)
NAMETYESE, FINKEN             ACCOUNT NO.:  [id number]   MEDICAL RECORD NO.:  [id number]          PATIENT TYPE:  EMS   LOCATION:  ED                           FACILITY:  [facility]   PHYSICIAN:  Hollice Espy, M.D.DATE OF BIRTH:  22-Jul-1929   DATE OF ADMISSION:  01/24/2008  DATE OF DISCHARGE:                              HISTORY & PHYSICAL   PRIMARY CARE PHYSICIAN:  [name], MD   CHIEF COMPLAINT:  Shortness of breath.   HISTORY OF PRESENT ILLNESS:  The patient is a 77 year old African  American female with past medical history of asthma and mild dementia  who presents to the emergency room after an asthmatic episode.  She  lives at home with family living close by and went to visit her and  noted that prior to the weekend she started having increased wheezing  and shortness of breath.  This progressed through the course of the  weekend and seemed to be getting worse today, so they brought her into  the emergency room.  In the emergency room, her chest x-ray was  unremarkable.  Labs were noted, when she first came in, that her  temperature was noted to be 103.  The patient was given a number of  breathing treatments and Solu-Medrol, and she continued to remain wheezy  and mildly dyspneic.  In addition, her blood pressure initially was  132/71, dropped slightly down to 80/50 requiring fluid bolus which she  responded to well, and her blood pressure since then has climbed up into  the 90s.  Currently, she is resting comfortably.  Her wheezing has  greatly diminished and she seems to be passing more air through.  She is  not able to give much history because she is sleeping somnolently, but  appears to be otherwise comfortable.   PAST MEDICAL HISTORY:  1. History of asthma.  2. GERD.  3. Dementia.   MEDICATIONS:  1. Albuterol and Atrovent inhaler.  2. Nexium 40 daily.  3. Singulair 10.  4. Claritin 10.  5. Hydroxyzine 15 p.o. q.4h.  6. Namenda 10 b.i.d.   ALLERGIES:  No known drug allergies.   SOCIAL HISTORY:  No current tobacco, alcohol or drug use.  Lives at  home.  Family lives close by.   FAMILY HISTORY:  Noncontributory.   PHYSICAL EXAMINATION:  VITAL SIGNS:  On admission, temperature 103,  blood pressure 132/71, as low as 86/49, now up to 95/42, respirations O2  saturation 92% on room air, up to 100% on 2 liters.  GENERAL:  She is somnolent, otherwise, alert and oriented x3.  HEENT:  Normocephalic, atraumatic.  Mucous membranes are slightly dry.  No carotid bruits.  HEART:  Regular rate and rhythm.  S1, S2.  LUNGS:  Moving air moderately well.  No wheezing.  ABDOMEN:  Soft, nontender, nondistended.  Positive bowel sounds.  EXTREMITIES:  No cyanosis, clubbing or edema.   STUDIES:  Chest x-ray shows low volumes, otherwise, unelicited  infiltrate.  Sodium 132, potassium 3.1, chloride 96, bicarbonate 27, BUN  9, creatinine 1.07, glucose 160, white count 7.1, H&H 13 and 39, MCV  84,  platelet count 143, 85% shift.  UA is moderate.  Hemoglobin otherwise  unremarkable.   ASSESSMENT/PLAN:  1. Asthma exacerbation, greatly improved, continue nebulizers,      steroids, oxygen and because of her temperature I put her on p.o.      Avelox for presumed underlying bronchitis, possible early      pneumonia.  2. Hypertension which is slowly improving.  Continue IV fluids.  3. Hypokalemia, replaced in the ER.      Hollice Espy, M.D.  Electronically Signed     SKK/MEDQ  D:  01/24/2008  T:  01/24/2008  Job:  [id number]   cc:   [name], MD  [address] [place]  [place] [address]   [name], MD, FCCP  [address]  [place] [address]

## 2011-04-18 NOTE — Assessment & Plan Note (Signed)
[name]                               PULMONARY OFFICE NOTE   [name], [name]                    MRN:          [id number]  DATE:06/12/2006                            DOB:          1929/10/15    REASON FOR VISIT:  This is a 77 year old, African-American female who  carries of a diagnosis of chronic asthma, hypertension, hyperlipidemia and  Alzheimer's disease for which she is maintained on the Namenda.  She comes  in today with worsening dyspnea over the last month associated with a  hacking, dry cough despite having increased Pulmicort up to 25 mg t.i.d.  The history gives me is that she responds to prednisone, but each time the  prednisone wears off, within a week or two her symptoms are back the same  they were before.  Her daughter, [name], assured me that she is taking the  medicines exactly as they are listed on the calendar, but I remain skeptical  because the patient apparently has multiple other medicines that are not on  the calendar that she has access to at home and apparently uses them.   MEDICATIONS:  For full list of medications as they are listed on the  calendar, please see face sheet dated June 12, 2006, although I am not  confident it is accurate.   PHYSICAL EXAMINATION:  GENERAL:  She is a chronically ill, black female in  no acute distress.  VITAL SIGNS:  Stable.  HEENT:  Unremarkable.  Oropharynx is clear.  LUNGS:  Lung fields revealed diminished breath sounds bilaterally with more  pseudo wheeze than true wheeze today.  HEART:  Regular rate and rhythm without murmurs, rubs or gallops.  ABDOMEN:  Benign.  EXTREMITIES:  Warm without calf tenderness.  No clubbing, cyanosis or edema.   IMPRESSION:  Poorly-controlled asthma with also a component of pseudo  asthma.   PLAN:  I recommended initiating prednisone 10 mg tablets, two until better  and then rapidly tapering off.  In addition to treating the asthmatic  component for presumed reflux with presumed antireflux measures including  diet and Nexium 40 mg taken before meals.   Finally, I spent extra time going over a day in her life emphasizing how  to use the calendar we have on a daily basis, regardless of which family  member is actually helping her with medicines.  I pointed out that it is actually extremely user friend and unambiguous and  that whoever brings her needs to remember to bring the calendar with her and  than use it when she goes home.  I  went over a complete day in her life with her daughter to help her  understand how to use the calendar to the patient's advantage.  Follow up  with me in 2 weeks, sooner if needed.                                  [name], MD, FCCP   MBW/MedQ  DD:  06/12/2006  DT:  06/12/2006  Job #:  [id number]

## 2011-04-18 NOTE — H&P (Signed)
NAMEJOURNII, NIERMAN             ACCOUNT NO.:  [id number]   MEDICAL RECORD NO.:  [id number]          PATIENT TYPE:  INP   LOCATION:  1844                         FACILITY:  [facility]   PHYSICIAN:  Corwin Levins, M.D. LHCDATE OF BIRTH:  12-03-1928   DATE OF ADMISSION:  01/16/2005  DATE OF DISCHARGE:                                HISTORY & PHYSICAL   CHIEF COMPLAINT:  Shortness of breath, hypoxia and fever in the ER.   HISTORY OF PRESENT ILLNESS:  Ms. Lippold is a 77 year old black female with  dementia seen in the ER with the above.  The daughter was present.  The main  problem seems to be increasing sleepiness all day, headache and nausea.  The  patient did mention some left lower quadrant pain most tender to a  remarkable degree to the daughter as well.  There has been some abdominal  bloating as well, last bowel movement was this morning, no blood noted.  Also noted in the ER however, was fever and mild hypoxia now for further  evaluation and treatment.   PAST MEDICAL HISTORY:  1.  Illnesses:      1.  Asthma.      2.  Hypertension.      3.  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      4.  COPD by chest x-ray January 2006.      5.  History of renal stone.      6.  Dementia.  2.  Surgeries:      1.  Status post appendectomy.      2.  Status post cholecystectomy.      3.  Status post right knee surgery.   ALLERGIES:  No known drug allergies.   CURRENT MEDICATIONS:  1.  Xopenex with steroid nebulizer treatments t.i.d. at home.  2.  Namenda b.i.d.   SOCIAL HISTORY:  No tobacco, no alcohol, never married, three children,  retired Lawyer.   FAMILY HISTORY:  COPD in father.   REVIEW OF SYSTEMS:  Otherwise not obtainable.   PHYSICAL EXAMINATION:  GENERAL:  Ms. Glauser is a 77 year old black female.  VITALS:  Blood pressure 152/86, heart rate 131 initially now 72,  respirations 18, O2 saturation 89% on room air, 99% on 2 L, temperature of  103.  ENT  EXAM:  Sclerae clear, TMs clear, pharynx with moderate edema, neck  without lymphadenopathy, JVD, thyromegaly.  CHEST:  Decreased breath sounds at base.  CARDIAC EXAM:  Regular rate and rhythm, no murmur but distant hard to hear  in the ER.  ABDOMEN:  Soft, questionable mild left quadrant tenderness though the  patient is sleepy, there is positive bowel sounds, there is some distention  likewise noted at the abdomen.  EXTREMITIES:  No edema.   LABORATORIES:  UA with 3-6 red blood cells and small leukocyte esterase and  blood otherwise negative, pH 7.49, pCO2 36.6, white blood cell count 13.6,  hemoglobin 13.3.  Electrolytes within normal limits except for potassium 3,  BUN 6, creatinine 1.1, glucose 122.  Chest x-ray no active disease.  ASSESSMENT AND PLAN:  1.  Fever.  Questionable pneumonia/chronic obstructive pulmonary disease      exacerbation versus possible diverticulitis or urinary tract infection      though I doubt the latter.  She is to be admitted, given oxygen,      intravenous fluids, intravenous Avelox after cultures, nebulizers and      intravenous steroid treatment.  2.  Abdominal pain.  Questionable constipation versus diverticulitis.  Check      abdominopelvic CT and antibiotics as above.  3.  Hypokalemia.  Replace intravenous.  4.  Other medical problems as above.  Continue home medications.  5.  Do Not Resuscitate status after discussion with daughter.      JWJ/MEDQ  D:  01/16/2005  T:  01/16/2005  Job:  [id number]   cc:   [name], M.D. [facility]

## 2011-04-18 NOTE — Assessment & Plan Note (Signed)
 [facility]                             PULMONARY OFFICE NOTE   [name], [name]                    MRN:          [id number]  DATE:01/22/2007                            DOB:          [birth year]    HISTORY OF PRESENT ILLNESS:  The patient is a 77 year old African-  American female patient of Dr. Thurston Hole who has a known history of asthma  who presents for a 2-week followup. Last visit the patient was having  some difficulty with insurance reimbursement for Xopenex and Pulmicort  and was changed over to Pulmicort and Brovana. The patient does have a  known history of dementia and is accompanied by her daughter who has not  noticed any increase of symptoms except she does continue to have an  intermittent cough without any significant purulent sputum or fever. The  patient was suppose to have brought all of her medications in today  however, the patient only brought her list.   PAST MEDICAL HISTORY:  Reviewed.   CURRENT MEDICATIONS:  Reviewed.   PHYSICAL EXAMINATION:  GENERAL:  The patient is a pleasant female in no  acute distress.  VITAL SIGNS:  She is afebrile with stable vital signs. O2 saturation  is  95% on room air. Temperature recheck was 98.7.  HEENT:  Unremarkable.  NECK:  Supple without adenopathy. No JVD.  LUNGS:  Lung sounds are clear without any wheezing or crackles.  CARDIAC:  Regular rate.  ABDOMEN:  Soft and nontender.  EXTREMITIES:  Warm without any edema.   IMPRESSION/PLAN:  1. Asthma. The patient appears to be doing well on her new regimen of      Brovana and Pulmicort. The patient will be back here in 4-6 weeks      or sooner if needed. The patient was given prescriptions and      samples today.  2. Complex medication regimen. A new computerized medication calendar      was adjusted for      this patient and reviewed with her daughter. The patient is aware      to bring this back to each and every visit.     Rubye Oaks, NP  Electronically Signed      [name], MD, [facility]  Electronically Signed   TP/MedQ  DD: 01/22/2007  DT: 01/22/2007  Job #: [id number]

## 2011-04-18 NOTE — Assessment & Plan Note (Signed)
[name]                             PULMONARY OFFICE NOTE   [name], [name]                    MRN:          [id number]  DATE:03/19/2007                            DOB:          July 11, 1929    PULMONARY/FINAL OFFICE VISIT:   HISTORY:  This is a 77 year old black female with asthma and Alzheimer's  disease, who follows Dr. Clent Ridges for primary care , and we have been trying  to help in the pulmonary clinic after discovering that she was not able  to use inhalers effectively and switched her, therefore, to a  combination of Brovana and Pulmicort.  Even on this, she appears to be  steroid dependent, as she has worsened over the last week after she ran  out of prednisone.  The reason she ran out of prednisone is not clear  because it is listed as a maintenance medicine on her medication  calendar, which I reviewed with her and her daughter in detail.  Even  with her daughter's help, she is having trouble keeping straight the  difference between maintenance and p.r.n. medicines.  They are listed  the way she takes them and are dated March 19, 2007.   PHYSICAL EXAMINATION:  GENERAL:  She is a chronically ill elderly black  female in no acute distress.  VITAL SIGNS:  Stable vital signs.  HEENT:  Unremarkable.  LUNGS:  Trace expiratory wheeze bilaterally.  HEART:  Regular rhythm without murmur, rub or gallop.  ABDOMEN:  Soft, benign.  EXTREMITIES:  Warm without calf tenderness, clubbing or cyanosis.   IMPRESSION:  The combination of poorly controlled asthma and dementia is  going to be very difficult because of the discipline required to  understand the difference between maintenance and p.r.n. medicines.  I  tried to recruit her daughter's help as much as possible, but I believe  she is at the limit of what she can do for her mother based on the fact  that she is only there in the morning, then again at night.  She herself  struggles to  understand the concept of the maintenance versus p.r.n. and  implementation of the extremely user-friendly and unambiguous medication  calendar that she has been provided.   In this setting, I believe the fewer cooks in the kitchen, the better.  All I did was review each and every one of her maintenance versus  p.r.n.'s and suggested she see Dr. Clent Ridges for followup.  For now, I  reluctantly left her on prednisone 10 mg a day and told her that each  and every one of her medicines ultimately will run out.  When it does,  it is our intention that they be refilled unless a doctor specifically  tells her otherwise.  The less doctors involving this decision-making  the better, so I am going to defer this all to Dr. Clent Ridges but certainly  will be happy to see her back anytime Dr. Clent Ridges has pulmonary questions  that need to be addressed.     [name], MD, [facility]  Electronically Signed    MBW/MedQ  DD: 03/19/2007  DT: 03/19/2007  Job #: [id number]   cc:   [name], MD

## 2011-04-18 NOTE — H&P (Signed)
[name], [name]             ACCOUNT NO.:  [id number]   MEDICAL RECORD NO.:  [id number]          PATIENT TYPE:  INP   LOCATION:  0104                         FACILITY:  [facility]   PHYSICIAN:  Georgina Quint. Plotnikov, M.D. LHCDATE OF BIRTH:  29-Dec-1928   DATE OF ADMISSION:  03/19/2005  DATE OF DISCHARGE:                                HISTORY & PHYSICAL   CHIEF COMPLAINT:  Shortness of breath and coughing.   HISTORY OF PRESENT ILLNESS:  The patient is a 77 year old woman with a few  day history of progressive shortness of breath and wheezing uncontrolled  with home asthma remedies.  She was worse today and was brought to the  emergency room, got better with several nebulizer treatments, however,  continued with shortness of breath and wheezing in spite of the above  treatments.  It was decided she requires admission.   PAST MEDICAL HISTORY:  Asthma, dementia.   ALLERGIES:  None.   MEDICATIONS:  Xopenex, Pulmicort, and Atrovent via nebulizer, Namenda 10 mg  b.i.d., Aricept 10 mg daily.   FAMILY HISTORY:  Negative for coronary artery disease.   SOCIAL HISTORY:  She lives with her son and daughter.   REVIEW OF SYMPTOMS:  Not obtainable from patient, according to daughter, she  is quiet, able to dress herself, occasional problems with asthma usually  controlled well with maintenance therapy.  The rest is negative or as above.  She has been having some cough lately probably productive.   PHYSICAL EXAMINATION:  VITAL SIGNS:  Blood pressure 221/113 on admission, rechecked 144/84,  respirations 24 on admission, later 18, saturations 99% on 2 liters.  GENERAL:  She is in no acute distress, not dyspnea.  HEENT:  Moist mucosa.  NECK:  Supple, no meningeal signs.  LUNGS:  Bilateral rhonchi and decreased expiratory phase.  HEART:  Regular, S1 and S2, no gallop.  ABDOMEN:  Soft, nontender, no organomegaly or masses palpable.  EXTREMITIES:  Lower extremities with trace edema.  NEUROLOGICAL:  She is alert, cooperative, disoriented.   LABORATORY DATA:  White count 13.6, hemoglobin 12.9.  Sodium 137, potassium  3, glucose 140, creatinine 1, calcium 8.7.  Chest x-ray clear.  Liver tests  normal.   ASSESSMENT/PLAN:  1.  Asthma exacerbation with severe dyspnea.  Will treat with oxygen,      handheld nebulization of Xopenex and Atrovent, IV Solu-Medrol.  2.  Elevated glucose, will monitor and use sliding scale p.r.n.  3.  Bronchitis, will use IV Zithromax.  4.  Lower extremity trace edema, will use Lasix.  5.  Hypokalemia, will replace p.o.      AVP/MEDQ  D:  03/19/2005  T:  03/19/2005  Job:  [id number]   cc:   [name], M.D. [facility]

## 2011-04-18 NOTE — Assessment & Plan Note (Signed)
[facility]                             PULMONARY OFFICE NOTE   NAME:BARBERZachary, [name]                    MRN:          [id number]  DATE:10/26/2006                            DOB:          [birth year]    HISTORY:  A 76 year old black female previously maintained on a floor  of prednisone 10 mg per day partly because of inability to follow  instructions and keep up with her medicines due to organic brain  syndrome in terms of making sure she was using her nebulizer regularly  with Pulmicort in it. Since she ran out of prednisone, despite the  family reporting she is using her Pulmicort 0.5 mg b.i.d. nebulizer, she  has had progressively worsening dyspnea with cough, congestion, and  white mucus production and was seen by Dr. Shelle Iron in this setting who  thought that reflux might be playing a role and doubled her PPI therapy  along with Brovex at bedtime to eliminate postnasal drip syndrome.  However, the patient reports no improvement on this regimen in the last  10 days. She denies any overt sinus or reflux symptoms.   Note that on Dr. Teddy Spike evaluation she had no audible wheezing.   PHYSICAL EXAMINATION:  GENERAL:  On physical examination now however she  is a pleasant, ambulatory, black female who has audible wheezing on  inspiration and expiration with no component of pseudowheezing  detectable.  HEART:  There is a regular rate and rhythm without murmur, gallop or  rub.  ABDOMEN:  Soft and benign.  EXTREMITIES:  Warm without calf tenderness, cyanosis or clubbing.   IMPRESSION:  Chronic asthma previously steroid dependent with both  pseudoasthmatic and asthmatic components. The fact that she had no upper  airway component today while being treated for both reflux and rhinitis  indicates that although this may be playing a role in her symptoms,  treatment of both rhinitis and aggressive treatment of reflux have not  returned her back to  baseline. Therefore reluctantly I restarted  prednisone at 20 mg per day to be tapered to a floor of 5 mg per day  and gave the family contingencies. I also emphasized to them compliance  with the complex medical regimen that is reflected in the medication  counter that she carries with her at home and I also simplified it  further so that there is only b.i.d. dosing (no noon or afternoon dosing  when the family is not present to make sure she takes her medicine).   I would however like to have her seen by the nurse practitioner in 2  weeks to make sure she is complying with the above regimen and certainly  see her back sooner if indicated.     [name], MD, [facility]  Electronically Signed    MBW/MedQ  DD: 10/26/2006  DT: 10/26/2006  Job #: [id number]

## 2011-04-18 NOTE — Op Note (Signed)
NAME:  [name], [name]                       ACCOUNT NO.:  [id number]   MEDICAL RECORD NO.:  [id number]                   PATIENT TYPE:  AMB   LOCATION:  ENDO                                 [facility]ACILITY[facility]  [facility]   PHYSICIAN:  Petra Kuba, M.D.                 DATE OF BIRTH:  Apr 23, 1929   DATE OF PROCEDURE:  02/22/2004  DATE OF DISCHARGE:                                 OPERATIVE REPORT   PROCEDURE:  Colonoscopy.   INDICATIONS FOR PROCEDURE:  Patient with left lower quadrant pain overdue  for colonic screening, nondiagnostic CT scan.   Consent was signed after risks, benefits, methods, and options were  thoroughly discussed in the office with both the patient and her daughter.   MEDICINES USED:  Demerol 50, Versed 5.   DESCRIPTION OF PROCEDURE:  Rectal inspection was pertinent for small  external hemorrhoids. Digital exam was negative. The pediatric video  adjustable colonoscope was inserted, easily advanced around the colon to the  cecum. This did require rolling her on her back and some abdominal pressure  and no abnormality was seen on insertion. The cecum was identified by the  appendiceal orifice and the ileocecal valve. In fact, the scope was inserted  a short ways into the terminal ileum which was normal. Photo documentation  was obtained. The scope was slowly withdrawn. The prep was adequate. On slow  withdrawal through the colon, no abnormalities were seen.  Specifically, no  polyps, tumors, masses, or diverticula. There was some scope trauma and a  little bit of friability in the sigmoid not seen on insertion thought  secondary to the scope and it was in a linear fashion but no other  abnormalities.  Anal rectal pullthrough and retroflexion confirmed some  small hemorrhoids.  The scope was reinserted a short ways up the left side  of the colon, air was suctioned, scope removed. The patient tolerated the  procedure well. There was no obvious or immediate complications.   ENDOSCOPIC DIAGNOSIS:  1. Small internal and external hemorrhoids.  2. Some friability secondary to scope trauma on the left side.  3. Otherwise within normal limits to the terminal ileum.   PLAN:  Call me p.r.n.  Followup in six weeks to recheck symptoms and see if  any further workup plans are needed.                                               Petra Kuba, M.D.    MEM/MEDQ  D:  02/22/2004  T:  02/22/2004  Job:  [id number]   cc:   [name], M.D. [facility]

## 2011-04-18 NOTE — Assessment & Plan Note (Signed)
[facility]                               PULMONARY OFFICE NOTE   [name], [name]                    MRN:          [id number]  DATE:06/26/2006                            DOB:          30-May-1929    HISTORY:  77 year old black female seen on June 12, 2006 with worsening  dyspnea over the last month or so, associated with a dry hacking cough  despite being maintained on Pulmicort per nebulizer.  I noticed quite a bit  of confusion regarding medication administration and recommended a revision  of a medication counter with full medication reconciliation and supplied  them with a detailed, very user-friendly, unambiguous list of her medicines  on her last visit along with the tapering of prednisone.  The daughter who  was there previously comes back with her mother today stating that she  forgot the list but has been following it.  This is including a tapering  dose of prednisone but she does not know actually what dose she is using  presently, but is overall very pleased that her mother is doing so much  better.   PHYSICAL EXAMINATION:  GENERAL:  This is an elderly black female in no acute  distress who appears very passive and lets her daughter do all of her  talking for her.  HEENT:  Unremarkable.  Oropharynx is clear.  LUNGS:  Lung fields are perfectly clear bilaterally to auscultation and  percussion.  HEART:  There is a regular rhythm without murmur, gallop or rub.  ABDOMEN:  Soft, benign.  EXTREMITIES:  Without calf tenderness, cyanosis, or clubbing.   IMPRESSION:  1.  Dramatic improvement in airways control in this never-smoker with      presumed chronic asthma but is exquisitely prednisone __________ .      This should translate into better control of her disease with topical      steroids if we could make sure that the patient actually gets the      medications as they are prescribed.  2.  To reinforce this issue I emphasized to  the daughter that it is      impossible to take care of her mother without developing a care plan      which was reflected on the previous calendar that she was given and      should still be followed today but at the top of the calendar it does      say to bring it with her to each visit, so that we don't re-invent the      wheel with each office visit.  3.  I have therefore asked her to go home and follow the same set of      instructions and given a prescription for Omeprazole to take the place      of Nexium, which she is having trouble affording, but otherwise made no      change at all in her regimen, which includes tapering prednisone      completely off if continues to do well on the maintenance regimen that  was      reviewed on the last visit.  4.  Followup will therefore be in two weeks or sooner if needed.                                  [name], MD, [facility]   MBW/MedQ  DD:  06/26/2006  DT:  06/26/2006  Job #:  [id number]   cc:   [name], MD

## 2011-04-18 NOTE — Assessment & Plan Note (Signed)
[facility]                               PULMONARY OFFICE NOTE   NAME:BARBERZahria, Ding                    MRN:          [id number]  DATE:07/10/2006                            DOB:          11-11-1929    HISTORY:  A 77 year old black female with chronic asthma, who has struggled  to keep track of her medicines on her own due to dementia and also is having  trouble because her family does not really get the message in terms of how  difficult it is to help this patient manage her medicines.  I spent extra  time on the last visit discussing this issue with her daughter, who comes  back today, having missed the message.  For instance, on the issue of  maintenance treatment with nebulizers, feeling that if her mother doesn't  need a nebulizer, she doesn't know why she should use it.  The record  indicates that I had recommended that she maintain b.i.d. treatment with  Xopenex/Pulmicort in an effort to wean her off of prednisone, and just to  use the prednisone to prove the point that she had significant reversible  airways disease.  The patient denies any nocturnal exacerbations of dyspnea,  or dyspnea with ADLs, or any significant nocturnal cough, chest pain,  fevers, chills, sweats or leg swelling.   The daughter did not bring with her the medicine again today (this is the  second time this has happened) even though at the top of the list it says  you must bring this with you.  She does say, however, she is following  it (note that she actually is not following it).   For a full inventory of medications they way they are supposed to be, please  see our patient sheet column dated 07/10/2006, but note the discrepancy with  the use of nebulizer therapy, which the daughter understands is p.r.n.  despite my efforts to the contrary.   PHYSICAL EXAMINATION:  GENERAL:  The patient is a passive, ambulatory black  female, happy-appearing.  VITAL SIGNS:   Afebrile, stable vital signs.  HEENT:  Unremarkable, oropharynx is clear.  LUNGS:  Lung fields are perfectly clear bilaterally to auscultation and  percussion.  CARDIAC:  There is a regular rhythm without murmur, gallop or rub.  ABDOMEN:  Soft, benign.  EXTREMITIES:  Warm without calf tenderness, cyanosis, clubbing or edema.   IMPRESSION:  Chronic asthma that has been under treated due to nonadherence  and misunderstanding by both the patient and now her family regarding the  meaning of a maintenance therapy.  The whole purpose of using the prednisone  was simply to establish an expectation that she will be free of symptoms and  then try to maintain the same level of function without the prednisone by  using the nebulized combination with Xopenex and Pulmicort, not p.r.n. but  perfectly regularly b.i.d.   Since the daughter for the second time has forgotten the list, I have  presented her with a list from our computer, and then went line by line over  it, reading it upside to  her so she could read it back to me and make sure  she understood it, both the maintenance and the p.r.n.s.   I then went through each and every pill that she had in her bag, emphasizing  that the p.r.n.s should be separated from the maintenance medicines to  reduce confusion, and emphasizing that that is why they are separated on  paper as well.   The daughter, [name], seemed to verbalize an understanding of this concept,  but did so on the previous visit without truly grasping it.  We will see how  she does by the time she returns, with the instruction to taper completely  off prednisone if tolerates, while maintaining on b.i.d.  Pulmicort/budesonide perfectly regularly.   Followup will be in 6 weeks with a full medication recalculation, with  medicines separated into 2 bags to see if they all correlate.                                   [name], MD, [facility]   MBW/MedQ  DD:  07/10/2006  DT:   07/11/2006  Job #:  [id number]   cc:   [name], MD

## 2011-04-18 NOTE — Assessment & Plan Note (Signed)
[facility]                               PULMONARY OFFICE NOTE   [name], [name]                    MRN:          [id number]  DATE:08/26/2006                            DOB:          09-19-1929    HISTORY OF PRESENT ILLNESS:  The patient is a 77 year old African American  patient of Dr. Thurston Hole who has a known history of  chronic asthma.  Presents  for a 6-week followup.  At last visit the patient was discontinued off of  her prednisone and a trial of Xopenex and Pulmicort twice daily.  The  patient reports that within one week of being off prednisone, symptoms  started to return with cough and shortness of breath and wheezing.  The  patient denies any hemoptysis, current sputum, fever, chest pain or  orthopnea or PND.   PAST MEDICAL HISTORY:  Reviewed.   CURRENT MEDICATIONS:  Reviewed.   PHYSICAL EXAMINATION:  The patient is a pleasant female in no acute  distress.  She is afebrile with stable vital signs.  O2 saturation is 95% on  room air.  HEENT:  Unremarkable  NECK:  Supple without adenopathy.  LUNG SOUNDS:  Reveal some coarse breath sounds with a few expiratory  wheezes.  CARDIAC:  Regular rate and rhythm.  ABDOMEN:  Soft, nontender.  EXTREMITIES:  Warm without any edema.   IMPRESSION AND PLAN:  1. Chronic asthma with flare off of systemic steroids.  The patient will      restart prednisone at 20 mg x5 days and then decrease down to 10 mg      daily until seen back with Dr. Sherene [name] in 2 weeks.  She will continue on      her Pulmicort and Xopenex nebulizer twice daily.  2. Complex medication regimen.  Patient's medications reviewed in detail.      Patient education was provided.  A computerized medication encounter      was adjusted for this patient and reviewed in detail.  The patient is      aware of bringing this back to each and every visit.     ______________________________  Rubye Oaks, NP    ______________________________  Kristen Dalton. [name], MD, [facility]    TP/MedQ  DD:  08/28/2006  DT:  08/31/2006  Job #:  [id number]
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
## 2011-04-18 NOTE — Assessment & Plan Note (Signed)
[facility]                             PULMONARY OFFICE NOTE   [name], [name]                    MRN:          [id number]  DATE:01/05/2007                            DOB:          Aug 22, 1929    PULMONARY/ACUTE OFFICE EVALUATION   HISTORY:  A 77 year old black female with difficulty with insurance  reimbursement for Xopenex and Pulmicort inhalers.  Her regimen has been  changed several times to accommodate previous restrictions and was  supposed to be a combination of Pulmicort 0.5 mg with Xopenex  1.5 mg  b.i.d. She has been having difficulty with sleeping at night without  being propped up but is not apparently using an nighttime dose of the  nebulizer.  She is also supposed to be taking omeprazole before  breakfast daily, but I am not convinced she actually doses this.  She  has not required any recent prednisone.   For further medications please see face sheet dated January 05, 2007 .   PHYSICAL EXAMINATION:  She is a chronically ill black female who lets  her daughter do all of her talking for her and actually did not speak  while she was in the office, but smiled frequently (she has underlying  dementia).  Her temperature here was 103.  Her vital signs were unremarkable with a  pulse rate of only 69.  HEENT:  Is remarkable for nonspecific turbinates.  Oropharynx is clear.  NECK:  Supple without cervical adenopathy or tenderness.  Trachea is  midline.  No thyromegaly.  LUNGS:  Fields show minimal rhonchi to FVC maneuver.  There is a regular rhythm without murmur, gallop, rub.  ABDOMEN:  Soft, benign.  EXTREMITIES:  Warm without calf tenderness, cyanosis, clubbing or edema.   IMPRESSION:  Asthma has been well controlled when she can take her  medicines consistently.  The problem is overcoming issues related to  adherence because of her dementia plus insurance restrictions.  I  therefore recommended that we switch her over to  formoterol 1 vial  b.i.d. along with Pulmicort 0.5 mg b.i.d. and use generic albuterol as  her rescue inhaler for her nebulizer.   I realized after the more I talked to the patient and her daughter, how  difficult it has been to try to coordinate her care and suspect this  patient is not far from needing nursing home placement because the  daughter is expecting the patient to remember to take her medicines, but  the patient clearly is not capable at this point of doing so.   I have arranged for her to see our nurse practitioner in 2 weeks to see  whether she responded to the above changes and whether her insurance  will reimburse her for this.  The larger issue is whether this patient  needs more help and perhaps even eventually a skilled nursing facility,  but I will defer this issue to Dr. Claris [name] capable hands and I have  encouraged them to make an appointment to see him as soon as possible.   I do note that she has a very low grade fever but  no specific symptoms  at this point.   I asked the daughter to be on the look out for worsening fever, chills  or specific symptoms that would indicate a possible source.     [name]. Kristen Sires, MD, [facility]  Electronically Signed    MBW/MedQ  DD: 01/05/2007  DT: 01/05/2007  Job #: [id number]   cc:   Kristen Senior A. Clent Ridges, MD

## 2011-04-18 NOTE — Discharge Summary (Signed)
[name], [name]             ACCOUNT NO.:  [id number]   MEDICAL RECORD NO.:  [id number]          PATIENT TYPE:  INP   LOCATION:  0357                         FACILITY:  [facility]   PHYSICIAN:  Stacie Glaze, M.D. LHCDATE OF BIRTH:  1929-01-17   DATE OF ADMISSION:  03/19/2005  DATE OF DISCHARGE:  03/21/2005                                 DISCHARGE SUMMARY   ADMITTING DIAGNOSES:  1.  Shortness of breath and coughing.  2.  Exacerbation of chronic obstructive pulmonary disease.  3.  Adult onset diabetes.   HOSPITAL COURSE:  The patient is a 77 year old black female with a several  day history of progressive shortness of breath and wheezing uncontrolled  with her home asthma medications.  She was brought to the emergency room,  was given nebulizer treatments, and improved slightly, but it was decided  that she needed a brief admission because of an exacerbation of her COPD and  because of her dementia.   Her home medications were Xopenex, Pulmicort, Atrovent via nebulizer,  Namenda, and Aricept on a daily basis.   She lives with her son and daughter.  She has moderate to severe Alzheimer's-  type dementia.   During her hospital course, she was treated with appropriate pulmonary  toilet, given nebulizer treatments, and covered with a sliding-scale insulin  due to elevated blood sugar with prednisone.  She had no prior history of  adult onset diabetes but now apparently will carry the diagnosis of glucose  intolerance, dysmetabolic syndrome, and probable adult onset diabetes.  An  A1C was obtained, however, which was 5.2, allowing [place] to treat this new  problem with dietary modification only at this time.   Her respiratory status cleared and on the day of discharge, she was sitting  in a chair, dressed in a housecoat, breathing without need of oxygen, and  without any evidence of respiratory distress.  She was discharged to home.  She will be given a prednisone taper and continue  her home medications, and  she will be given appropriate antibiotic therapy as noted on her pink sheet.  She will continue Ceftin 500 mg b.i.d. for 8 days.  Hydrochlorothiazide 25  mg will be started daily for blood pressure control, and a prednisone taper  will be accomplished over the next 6 days.  She was discharged home in  stable condition.      JEJ/MEDQ  D:  03/22/2005  T:  03/22/2005  Job:  [id number]   cc:   [name], M.D. [facility]

## 2011-05-06 ENCOUNTER — Encounter: Payer: Self-pay | Admitting: Family Medicine

## 2011-05-06 ENCOUNTER — Ambulatory Visit (INDEPENDENT_AMBULATORY_CARE_PROVIDER_SITE_OTHER): Payer: Medicare PPO | Admitting: Family Medicine

## 2011-05-06 VITALS — BP 122/74 | HR 70 | Temp 97.7°F | Wt 147.0 lb

## 2011-05-06 DIAGNOSIS — Z13820 Encounter for screening for osteoporosis: Secondary | ICD-10-CM

## 2011-05-06 DIAGNOSIS — N390 Urinary tract infection, site not specified: Secondary | ICD-10-CM

## 2011-05-06 DIAGNOSIS — M549 Dorsalgia, unspecified: Secondary | ICD-10-CM

## 2011-05-06 LAB — POCT URINALYSIS DIPSTICK
Bilirubin, UA: NEGATIVE
Ketones, UA: NEGATIVE
Leukocytes, UA: NEGATIVE

## 2011-05-06 NOTE — Progress Notes (Signed)
  Subjective:    Patient ID: [name], female    DOB: [birth year], 77 y.o.   MRN: [id number]  HPI Here with her daughter for some weakness yesterday and saying she did not "feel good". Today she seems to be back to her baseline. No fever or cough. Appetite good. No change in urinations or BMs. She does complain of back pain and joint pain at times.    Review of Systems  Constitutional: Positive for fatigue.  Respiratory: Negative.   Cardiovascular: Negative.   Gastrointestinal: Negative.   Genitourinary: Negative.        Objective:   Physical Exam  Constitutional: She appears well-developed and well-nourished.  Neck: No thyromegaly present.  Cardiovascular: Normal rate, regular rhythm, normal heart sounds and intact distal pulses.   Pulmonary/Chest: Effort normal and breath sounds normal.  Abdominal: Soft. Bowel sounds are normal. She exhibits no distension and no mass. There is no tenderness. There is no rebound and no guarding.  Lymphadenopathy:    She has no cervical adenopathy.          Assessment & Plan:  She seems to be fine today. Recheck prn . Use Tylenol for joint pains. Set up her first DEXA

## 2011-05-15 ENCOUNTER — Inpatient Hospital Stay: Admission: RE | Admit: 2011-05-15 | Payer: Medicare PPO | Source: Ambulatory Visit

## 2011-05-19 ENCOUNTER — Ambulatory Visit (INDEPENDENT_AMBULATORY_CARE_PROVIDER_SITE_OTHER)
Admission: RE | Admit: 2011-05-19 | Discharge: 2011-05-19 | Disposition: A | Discharge status: Home / Self Care | Payer: Medicare PPO | Source: Ambulatory Visit | Attending: Family Medicine | Admitting: Family Medicine

## 2011-05-19 DIAGNOSIS — Z13820 Encounter for screening for osteoporosis: Secondary | ICD-10-CM

## 2011-05-27 ENCOUNTER — Encounter: Payer: Self-pay | Admitting: Family Medicine

## 2011-05-27 ENCOUNTER — Telehealth: Payer: Self-pay

## 2011-05-27 MED ORDER — ALENDRONATE SODIUM 35 MG PO TABS: 35.0000 mg | ORAL_TABLET | ORAL | Status: DC

## 2011-05-27 NOTE — Telephone Encounter (Signed)
Pt's daughter aware and rx sent to pharm.  Rx is Fosamax 35 mg once per week. #4 with 11 rf, per Dr. Clent Ridges. Pt's daughter is aware of change

## 2011-05-27 NOTE — Telephone Encounter (Signed)
Message copied by Beverely Low on Tue May 27, 2011  4:18 PM ------      Message from: Gershon Crane A      Created: Tue May 27, 2011  1:19 PM       She has advanced osteoporosis. In addition to her calcium and vitamin D, start on generic Fosamax 150 mg once a month. Call in #1 with 11 rf

## 2011-05-28 ENCOUNTER — Telehealth: Payer: Self-pay | Admitting: Internal Medicine

## 2011-05-28 ENCOUNTER — Encounter: Payer: Self-pay | Admitting: Adult Health

## 2011-05-28 NOTE — Telephone Encounter (Signed)
Pt added on to TP's sched 6.28.12 @ 12N.  Pt's daughter okay with this time.

## 2011-05-28 NOTE — Telephone Encounter (Signed)
Spoke with [name]. She states pt has had cold x 2 wks- cough seems to be getting worse, not able to produce sputum and she thinks that pt may be forgetting to take meds as she should. I advised will need appt. Will forward to JJ per her request, so that she can find spot for her on TP's sched.

## 2011-05-28 NOTE — Telephone Encounter (Signed)
[id number]

## 2011-05-28 NOTE — Telephone Encounter (Signed)
PATIENT'S DAUGHTER KAREN COURTNEY RETURNED CALL.  PLEASE CALL BACK AT [phone]

## 2011-05-29 ENCOUNTER — Ambulatory Visit (INDEPENDENT_AMBULATORY_CARE_PROVIDER_SITE_OTHER): Payer: Medicare PPO | Admitting: Adult Health

## 2011-05-29 ENCOUNTER — Encounter: Payer: Self-pay | Admitting: Adult Health

## 2011-05-29 ENCOUNTER — Ambulatory Visit: Payer: Medicare PPO | Admitting: Adult Health

## 2011-05-29 DIAGNOSIS — J209 Acute bronchitis, unspecified: Secondary | ICD-10-CM

## 2011-05-29 MED ORDER — AMOXICILLIN-POT CLAVULANATE 875-125 MG PO TABS: 1.0000 | ORAL_TABLET | Freq: Two times a day (BID) | ORAL | Status: AC

## 2011-05-29 NOTE — Patient Instructions (Signed)
Augmentin 875mg  Twice daily  For 7 days -take with food.  Mucinex DM Twice daily  As needed  Cough/congestion  Increase Prednisone 10mg  2 tabs daily for 1 week then back to 1 daily  Please contact office for sooner follow up if symptoms do not improve or worsen or seek emergency care ; follow up Dr. [name]  In 6 weeks and As needed

## 2011-05-30 NOTE — Assessment & Plan Note (Signed)
Mild Flare:   Augmentin 875mg  Twice daily  For 7 days -take with food.  Mucinex DM Twice daily  As needed  Cough/congestion  Increase Prednisone 10mg  2 tabs daily for 1 week then back to 1 daily  Please contact office for sooner follow up if symptoms do not improve or worsen or seek emergency care ; follow up Dr. [name]  In 6 weeks and As needed

## 2011-05-30 NOTE — Progress Notes (Signed)
  Subjective:    Patient ID: [name], female    DOB: 07/07/1929, 77 y.o.   MRN: [id number]  HPI 77 yo female with known hx of Dementia, HTN, GERD, Asthma,   05/29/11 Acute OV  Complains of wheezing, chest congestion, cough occ producing thick yellow mucus, x1.5weeks.  Pt is with her daughter today. She is as pleasant as always, smiling the whole visit.  Daughter says her cough and wheezing are getting worse. OTC not helping.  No increased confusion or hemoptysis   Review of Systems Constitutional:   No  weight loss, night sweats,  Fevers, chills, fatigue, or  lassitude.  HEENT:   No headaches,  Difficulty swallowing,  Tooth/dental problems, or  Sore throat,                No sneezing, itching, ear ache, nasal congestion, post nasal drip,   CV:  No chest pain,  Orthopnea, PND, swelling in lower extremities, anasarca, dizziness, palpitations, syncope.   GI  No heartburn, indigestion, abdominal pain, nausea, vomiting, diarrhea, change in bowel habits, loss of appetite, bloody stools.   Resp:  No coughing up of blood.     No chest wall deformity Skin: no rash or lesions.  GU: no dysuria, change in color of urine, no urgency or frequency.  No flank pain, no hematuria   MS:  No joint pain or swelling.  No decreased range of motion.  No back pain.  Psych:  No change in mood or affect. No depression or anxiety.            Objective:   Physical Exam GEN: A/Ox3; pleasant , NAD  HEENT:  [place]/AT,  EACs-clear, TMs-wnl, NOSE-clear, THROAT-clear, no lesions, no postnasal drip or exudate noted.   NECK:  Supple w/ fair ROM; no JVD; normal carotid impulses w/o bruits; no thyromegaly or nodules palpated; no lymphadenopathy.  RESP  Coarse Rhonchi, no accessory muscle use, no dullness to percussion  CARD:  RRR, no m/r/g  , no peripheral edema, pulses intact, no cyanosis or clubbing.  GI:   Soft & nt; nml bowel sounds; no organomegaly or masses detected.  Musco: Warm bil, no  deformities or joint swelling noted.   Neuro: alert, no focal deficits noted. . Pleasantly confused    Skin: Warm, no lesions or rashes         Assessment & Plan:

## 2011-06-03 ENCOUNTER — Inpatient Hospital Stay (HOSPITAL_COMMUNITY)
Admission: EM | Admit: 2011-06-03 | Discharge: 2011-06-12 | DRG: 469 | Disposition: A | Discharge status: Skilled Nursing Facility | Payer: Medicare PPO | Attending: Internal Medicine | Admitting: Internal Medicine

## 2011-06-03 ENCOUNTER — Emergency Department (HOSPITAL_COMMUNITY): Payer: Medicare PPO

## 2011-06-03 DIAGNOSIS — E0781 Sick-euthyroid syndrome: Secondary | ICD-10-CM | POA: Diagnosis present

## 2011-06-03 DIAGNOSIS — G309 Alzheimer's disease, unspecified: Secondary | ICD-10-CM | POA: Diagnosis present

## 2011-06-03 DIAGNOSIS — W010XXA Fall on same level from slipping, tripping and stumbling without subsequent striking against object, initial encounter: Secondary | ICD-10-CM | POA: Diagnosis present

## 2011-06-03 DIAGNOSIS — J189 Pneumonia, unspecified organism: Secondary | ICD-10-CM | POA: Diagnosis not present

## 2011-06-03 DIAGNOSIS — Y92009 Unspecified place in unspecified non-institutional (private) residence as the place of occurrence of the external cause: Secondary | ICD-10-CM

## 2011-06-03 DIAGNOSIS — S72009A Fracture of unspecified part of neck of unspecified femur, initial encounter for closed fracture: Principal | ICD-10-CM | POA: Diagnosis present

## 2011-06-03 DIAGNOSIS — IMO0002 Reserved for concepts with insufficient information to code with codable children: Secondary | ICD-10-CM

## 2011-06-03 DIAGNOSIS — M810 Age-related osteoporosis without current pathological fracture: Secondary | ICD-10-CM | POA: Diagnosis present

## 2011-06-03 DIAGNOSIS — N182 Chronic kidney disease, stage 2 (mild): Secondary | ICD-10-CM | POA: Diagnosis present

## 2011-06-03 DIAGNOSIS — I498 Other specified cardiac arrhythmias: Secondary | ICD-10-CM | POA: Diagnosis present

## 2011-06-03 DIAGNOSIS — F0280 Dementia in other diseases classified elsewhere without behavioral disturbance: Secondary | ICD-10-CM | POA: Diagnosis present

## 2011-06-03 DIAGNOSIS — E876 Hypokalemia: Secondary | ICD-10-CM | POA: Diagnosis present

## 2011-06-03 DIAGNOSIS — J45909 Unspecified asthma, uncomplicated: Secondary | ICD-10-CM | POA: Diagnosis present

## 2011-06-03 DIAGNOSIS — N179 Acute kidney failure, unspecified: Secondary | ICD-10-CM | POA: Diagnosis present

## 2011-06-03 LAB — CBC
HCT: 45.3 % (ref 36.0–46.0)
MCV: 86.3 fL (ref 78.0–100.0)
Platelets: 146 10*3/uL — ABNORMAL LOW (ref 150–400)
RBC: 5.25 MIL/uL — ABNORMAL HIGH (ref 3.87–5.11)
WBC: 13.6 10*3/uL — ABNORMAL HIGH (ref 4.0–10.5)

## 2011-06-03 LAB — DIFFERENTIAL
Eosinophils Absolute: 0.2 10*3/uL (ref 0.0–0.7)
Lymphocytes Relative: 25 % (ref 12–46)
Lymphs Abs: 3.4 10*3/uL (ref 0.7–4.0)
Neutrophils Relative %: 65 % (ref 43–77)

## 2011-06-03 LAB — BASIC METABOLIC PANEL
BUN: 17 mg/dL (ref 6–23)
Chloride: 101 mEq/L (ref 96–112)
GFR calc Af Amer: 54 mL/min — ABNORMAL LOW (ref >60)
Glucose, Bld: 92 mg/dL (ref 70–99)
Potassium: 3.2 mEq/L — ABNORMAL LOW (ref 3.5–5.1)
Sodium: 139 mEq/L (ref 135–145)

## 2011-06-04 LAB — URINALYSIS, ROUTINE W REFLEX MICROSCOPIC
Bilirubin Urine: NEGATIVE
Glucose, UA: NEGATIVE mg/dL
Ketones, ur: NEGATIVE mg/dL
Nitrite: NEGATIVE
Specific Gravity, Urine: 1.021 (ref 1.005–1.030)
pH: 6 (ref 5.0–8.0)

## 2011-06-04 LAB — COMPREHENSIVE METABOLIC PANEL
ALT: 26 U/L (ref 0–35)
CO2: 25 mEq/L (ref 19–32)
Calcium: 8.4 mg/dL (ref 8.4–10.5)
Creatinine, Ser: 1.23 mg/dL — ABNORMAL HIGH (ref 0.50–1.10)
GFR calc Af Amer: 51 mL/min — ABNORMAL LOW (ref >60)
GFR calc non Af Amer: 42 mL/min — ABNORMAL LOW (ref >60)
Glucose, Bld: 126 mg/dL — ABNORMAL HIGH (ref 70–99)
Sodium: 138 mEq/L (ref 135–145)
Total Protein: 5.8 g/dL — ABNORMAL LOW (ref 6.0–8.3)

## 2011-06-04 LAB — TSH: TSH: 8.279 u[IU]/mL — ABNORMAL HIGH (ref 0.350–4.500)

## 2011-06-04 LAB — CARDIAC PANEL(CRET KIN+CKTOT+MB+TROPI)
CK, MB: 1.5 ng/mL (ref 0.3–4.0)
CK, MB: 1.4 ng/mL (ref 0.3–4.0)
Relative Index: INVALID (ref 0.0–2.5)
Total CK: 46 U/L (ref 7–177)
Total CK: 45 U/L (ref 7–177)
Total CK: 68 U/L (ref 7–177)
Troponin I: <0.3 ng/mL (ref <0.30)

## 2011-06-04 LAB — PHOSPHORUS: Phosphorus: 2.5 mg/dL (ref 2.3–4.6)

## 2011-06-04 LAB — CBC
HCT: 41.2 % (ref 36.0–46.0)
Hemoglobin: 12.7 g/dL (ref 12.0–15.0)
MCHC: 30.8 g/dL (ref 30.0–36.0)
RBC: 4.71 MIL/uL (ref 3.87–5.11)
WBC: 17.2 10*3/uL — ABNORMAL HIGH (ref 4.0–10.5)

## 2011-06-04 LAB — MRSA PCR SCREENING: MRSA by PCR: NEGATIVE

## 2011-06-04 LAB — URINE MICROSCOPIC-ADD ON

## 2011-06-05 ENCOUNTER — Inpatient Hospital Stay (HOSPITAL_COMMUNITY): Payer: Medicare PPO

## 2011-06-05 LAB — COMPREHENSIVE METABOLIC PANEL
AST: 21 U/L (ref 0–37)
Albumin: 2.7 g/dL — ABNORMAL LOW (ref 3.5–5.2)
Alkaline Phosphatase: 61 U/L (ref 39–117)
CO2: 22 mEq/L (ref 19–32)
Chloride: 106 mEq/L (ref 96–112)
GFR calc non Af Amer: >60 mL/min (ref >60)
Potassium: 3.8 mEq/L (ref 3.5–5.1)
Total Bilirubin: 1.6 mg/dL — ABNORMAL HIGH (ref 0.3–1.2)

## 2011-06-05 LAB — CBC
HCT: 42.4 % (ref 36.0–46.0)
MCV: 86.9 fL (ref 78.0–100.0)
Platelets: 128 10*3/uL — ABNORMAL LOW (ref 150–400)
RBC: 4.88 MIL/uL (ref 3.87–5.11)
RDW: 14.8 % (ref 11.5–15.5)
WBC: 14.8 10*3/uL — ABNORMAL HIGH (ref 4.0–10.5)

## 2011-06-05 LAB — URINE CULTURE: Culture: NO GROWTH

## 2011-06-05 LAB — DIFFERENTIAL
Basophils Absolute: 0 10*3/uL (ref 0.0–0.1)
Eosinophils Absolute: 0.6 10*3/uL (ref 0.0–0.7)
Eosinophils Relative: 4 % (ref 0–5)
Lymphocytes Relative: 12 % (ref 12–46)
Lymphs Abs: 1.7 10*3/uL (ref 0.7–4.0)
Neutrophils Relative %: 75 % (ref 43–77)

## 2011-06-06 LAB — CBC
HCT: 36.6 % (ref 36.0–46.0)
Hemoglobin: 11.9 g/dL — ABNORMAL LOW (ref 12.0–15.0)
RBC: 4.23 MIL/uL (ref 3.87–5.11)
WBC: 12.7 10*3/uL — ABNORMAL HIGH (ref 4.0–10.5)

## 2011-06-06 LAB — BASIC METABOLIC PANEL
CO2: 24 mEq/L (ref 19–32)
Chloride: 107 mEq/L (ref 96–112)
GFR calc non Af Amer: >60 mL/min (ref >60)
Glucose, Bld: 191 mg/dL — ABNORMAL HIGH (ref 70–99)
Potassium: 4.2 mEq/L (ref 3.5–5.1)
Sodium: 138 mEq/L (ref 135–145)

## 2011-06-06 LAB — T3, FREE: T3, Free: 2 pg/mL — ABNORMAL LOW (ref 2.3–4.2)

## 2011-06-07 ENCOUNTER — Inpatient Hospital Stay (HOSPITAL_COMMUNITY): Payer: Medicare PPO

## 2011-06-07 LAB — URINE MICROSCOPIC-ADD ON

## 2011-06-07 LAB — URINALYSIS, ROUTINE W REFLEX MICROSCOPIC
Glucose, UA: 100 mg/dL — AB
Protein, ur: NEGATIVE mg/dL
Specific Gravity, Urine: 1.021 (ref 1.005–1.030)

## 2011-06-07 LAB — CBC
HCT: 32.6 % — ABNORMAL LOW (ref 36.0–46.0)
Hemoglobin: 10.7 g/dL — ABNORMAL LOW (ref 12.0–15.0)
RBC: 3.83 MIL/uL — ABNORMAL LOW (ref 3.87–5.11)
RDW: 14.7 % (ref 11.5–15.5)
WBC: 18.3 10*3/uL — ABNORMAL HIGH (ref 4.0–10.5)

## 2011-06-07 LAB — BASIC METABOLIC PANEL
BUN: 14 mg/dL (ref 6–23)
CO2: 25 mEq/L (ref 19–32)
Chloride: 106 mEq/L (ref 96–112)
GFR calc Af Amer: >60 mL/min (ref >60)
Potassium: 3.6 mEq/L (ref 3.5–5.1)

## 2011-06-08 ENCOUNTER — Inpatient Hospital Stay (HOSPITAL_COMMUNITY): Payer: Medicare PPO

## 2011-06-08 LAB — BASIC METABOLIC PANEL
BUN: 12 mg/dL (ref 6–23)
GFR calc Af Amer: >60 mL/min (ref >60)
GFR calc non Af Amer: 59 mL/min — ABNORMAL LOW (ref >60)
Potassium: 3.8 mEq/L (ref 3.5–5.1)
Sodium: 134 mEq/L — ABNORMAL LOW (ref 135–145)

## 2011-06-08 LAB — HEPATIC FUNCTION PANEL
Albumin: 2.2 g/dL — ABNORMAL LOW (ref 3.5–5.2)
Indirect Bilirubin: 1.1 mg/dL — ABNORMAL HIGH (ref 0.3–0.9)
Total Bilirubin: 1.4 mg/dL — ABNORMAL HIGH (ref 0.3–1.2)
Total Protein: 6.1 g/dL (ref 6.0–8.3)

## 2011-06-08 LAB — DIFFERENTIAL
Basophils Absolute: 0 10*3/uL (ref 0.0–0.1)
Basophils Relative: 0 % (ref 0–1)
Eosinophils Relative: 6 % — ABNORMAL HIGH (ref 0–5)
Monocytes Absolute: 1.9 10*3/uL — ABNORMAL HIGH (ref 0.1–1.0)

## 2011-06-08 LAB — CBC
MCHC: 32.9 g/dL (ref 30.0–36.0)
RDW: 14.7 % (ref 11.5–15.5)

## 2011-06-08 MED ORDER — IOHEXOL 300 MG/ML  SOLN
100.0000 mL | Freq: Once | INTRAMUSCULAR | Status: AC | PRN
  Administered 2011-06-08: 100 mL via INTRAVENOUS

## 2011-06-09 LAB — T4, FREE: Free T4: 1.26 ng/dL (ref 0.80–1.80)

## 2011-06-09 LAB — URINE CULTURE
Culture  Setup Time: 201207080250
Special Requests: NEGATIVE

## 2011-06-09 LAB — BASIC METABOLIC PANEL
CO2: 24 mEq/L (ref 19–32)
Chloride: 97 mEq/L (ref 96–112)
Creatinine, Ser: 0.68 mg/dL (ref 0.50–1.10)
Potassium: 3.3 mEq/L — ABNORMAL LOW (ref 3.5–5.1)

## 2011-06-09 LAB — CBC
MCV: 84.3 fL (ref 78.0–100.0)
Platelets: 130 10*3/uL — ABNORMAL LOW (ref 150–400)
RBC: 3.95 MIL/uL (ref 3.87–5.11)
WBC: 19.2 10*3/uL — ABNORMAL HIGH (ref 4.0–10.5)

## 2011-06-09 LAB — T3, FREE: T3, Free: 1.9 pg/mL — ABNORMAL LOW (ref 2.3–4.2)

## 2011-06-09 LAB — TSH: TSH: 1.519 u[IU]/mL (ref 0.350–4.500)

## 2011-06-10 LAB — CULTURE, BLOOD (ROUTINE X 2): Culture  Setup Time: 201207080154

## 2011-06-10 LAB — BASIC METABOLIC PANEL
GFR calc non Af Amer: >60 mL/min (ref >60)
Glucose, Bld: 145 mg/dL — ABNORMAL HIGH (ref 70–99)
Potassium: 3.5 mEq/L (ref 3.5–5.1)
Sodium: 131 mEq/L — ABNORMAL LOW (ref 135–145)

## 2011-06-10 LAB — DIFFERENTIAL
Basophils Absolute: 0 10*3/uL (ref 0.0–0.1)
Basophils Relative: 0 % (ref 0–1)
Neutro Abs: 14.9 10*3/uL — ABNORMAL HIGH (ref 1.7–7.7)
Neutrophils Relative %: 74 % (ref 43–77)

## 2011-06-10 LAB — CBC
Hemoglobin: 11.5 g/dL — ABNORMAL LOW (ref 12.0–15.0)
MCHC: 32.2 g/dL (ref 30.0–36.0)
RBC: 4.21 MIL/uL (ref 3.87–5.11)
WBC: 20 10*3/uL — ABNORMAL HIGH (ref 4.0–10.5)

## 2011-06-11 ENCOUNTER — Inpatient Hospital Stay (HOSPITAL_COMMUNITY): Payer: Medicare PPO

## 2011-06-11 LAB — DIFFERENTIAL
Basophils Absolute: 0.1 K/uL (ref 0.0–0.1)
Basophils Relative: 0 % (ref 0–1)
Eosinophils Absolute: 0.9 K/uL — ABNORMAL HIGH (ref 0.0–0.7)
Eosinophils Relative: 5 % (ref 0–5)
Lymphocytes Relative: 17 % (ref 12–46)
Lymphs Abs: 3.2 K/uL (ref 0.7–4.0)
Monocytes Absolute: 1.9 K/uL — ABNORMAL HIGH (ref 0.1–1.0)
Monocytes Relative: 10 % (ref 3–12)
Neutro Abs: 13 K/uL — ABNORMAL HIGH (ref 1.7–7.7)
Neutrophils Relative %: 68 % (ref 43–77)

## 2011-06-11 LAB — BASIC METABOLIC PANEL WITH GFR
BUN: 17 mg/dL (ref 6–23)
CO2: 25 meq/L (ref 19–32)
Calcium: 9 mg/dL (ref 8.4–10.5)
Chloride: 103 meq/L (ref 96–112)
Creatinine, Ser: 0.87 mg/dL (ref 0.50–1.10)
GFR calc Af Amer: >60 mL/min
GFR calc non Af Amer: >60 mL/min
Glucose, Bld: 114 mg/dL — ABNORMAL HIGH (ref 70–99)
Potassium: 3.6 meq/L (ref 3.5–5.1)
Sodium: 135 meq/L (ref 135–145)

## 2011-06-11 LAB — CBC
HCT: 33.2 % — ABNORMAL LOW (ref 36.0–46.0)
Hemoglobin: 10.7 g/dL — ABNORMAL LOW (ref 12.0–15.0)
MCHC: 32.2 g/dL (ref 30.0–36.0)
WBC: 19.1 10*3/uL — ABNORMAL HIGH (ref 4.0–10.5)

## 2011-06-11 NOTE — Op Note (Signed)
  [name], Kristen             ACCOUNT NO.:  [id number]  MEDICAL RECORD NO.:  [id number]  LOCATION:  1513                         FACILITY:  [facility]  PHYSICIAN:  Nadara Mustard, MD     DATE OF BIRTH:  [birth year]  DATE OF PROCEDURE:  06/05/2011 DATE OF DISCHARGE:  06/05/2011                              OPERATIVE REPORT   PREOPERATIVE DIAGNOSIS:  Left femoral neck fracture.  POSTOPERATIVE DIAGNOSIS:  Left femoral neck fracture.  PROCEDURE:  Left hip hemiarthroplasty with Zimmer size 13 stem,  44-mm bipolar head, +0 neck.  SURGEON:  Nadara Mustard, M.D.  ANESTHESIA:  Spinal.  ESTIMATED BLOOD LOSS:  Minimal.  ANTIBIOTICS:  1 gram of Kefzol.  DRAINS:  None.  COMPLICATIONS:  None.  TOURNIQUET TIME:  None.  DISPOSITION:  PACU in stable condition.  INDICATION FOR PROCEDURE:  Patient is an 77 year old woman with Alzheimer's disease, who fell at home sustaining a femoral neck fracture.  Patient was admitted, medically stabilized and presents at this time for hemiarthroplasty.  Risks and benefits were discussed with the patient's daughter and her son including infection, neurovascular injury, persistent pain, DVT, pulmonary embolus, dislocation of the hip, need for additional surgery.  Patient's son and daughter state they understand and wished to proceed at this time.  DESCRIPTION OF PROCEDURE:  Patient was brought to the OR and underwent spinal anesthetic, after adequate anesthesia was obtained, patient was placed in the right lateral decubitus position with the left side up and the left lower extremity was prepped using DuraPrep and draped into sterile field.  [name] was used to cover all exposed skin.  A posterolateral incision was made.  This was carried down to the tensor fascia lata, which was split.  Cerebellar retractors were placed.  The piriformis short external rotators and capsule were incised longitudinally off the femoral neck and retracted.  The neck cut  was made 1 cm proximal to calcar.  The head was delivered.  This measured a size 44.  The 44 and 45 head was tried and the 44 had a good suction fit.  The stem was sequentially broached up to a size 13.  A size 13 stem was placed with a 44 bipolar head.  The wound was irrigated and the acetabulum was irrigated.  All loose bone fragments were removed.  The hip was reduced and placed in range of motion.  The hip was stable.  The capsule piriformis and short external rotators were reapproximated using #1 Vicryl.  The tensor fascia lata was closed using #1 Vicryl.  Subcu was closed using 0 Vicryl.  The skin was closed with approximating staples.  Mepilex dressing was applied.  The patient then had a bed pillow placed between her legs and was then taken to PACU in stable condition.     Nadara Mustard, MD     MVD/MEDQ  D:  06/05/2011  T:  06/05/2011  Job:  [id number]  Electronically Signed by Aldean Baker MD on 06/11/2011 09:43:01 AM

## 2011-06-12 LAB — DIFFERENTIAL
Eosinophils Absolute: 2.9 10*3/uL — ABNORMAL HIGH (ref 0.0–0.7)
Eosinophils Relative: 17 % — ABNORMAL HIGH (ref 0–5)
Lymphocytes Relative: 37 % (ref 12–46)
Monocytes Absolute: 1.7 10*3/uL — ABNORMAL HIGH (ref 0.1–1.0)
Neutrophils Relative %: 35 % — ABNORMAL LOW (ref 43–77)

## 2011-06-12 LAB — CBC
MCHC: 32.2 g/dL (ref 30.0–36.0)
RDW: 15.2 % (ref 11.5–15.5)

## 2011-06-13 NOTE — Discharge Summary (Signed)
NAMEJUELLE, DICKMANN             ACCOUNT NO.:  [id number]  MEDICAL RECORD NO.:  [id number]  LOCATION:                                 FACILITY:  PHYSICIAN:  Kathlen Mody, MD       DATE OF BIRTH:  1929/04/04  DATE OF ADMISSION:  06/04/2011 DATE OF DISCHARGE:  06/12/2011                              DISCHARGE SUMMARY   PRIMARY CARE PHYSICIAN:  [name], MD  PULMONOLOGIST:  [name], MD, FCCP.  DISCHARGE DIAGNOSES: 1. Left femoral neck fracture status post left hip hemiarthroplasty. 2. Mechanical fall. 3. Advanced dementia. 4. Healthcare acquired pneumonia. 5. Leukocytosis. 6. History of asthma, on chronic prednisone therapy. 7. Osteopenia. 8. Sick euthyroid syndrome. 9. One out of two positive blood cultures for coagulase staph.  DISCHARGE MEDICATIONS: 1. Aspirin 81 mg daily. 2. MiraLax 17 g p.o. daily p.r.n. 3. Atrovent nebulizer q.6 h. as needed. 4. Bisacodyl 10 mg daily per rectal as needed. 5. Lopressor 50 mg p.o. b.i.d. 6. Ensure b.i.d. a.c. 7. Robitussin syrup  q.8 h. 8. Albuterol nebulizer q.6 h. p.r.n. 9. Fosamax once weekly. 10.Vitamin C 500 mg 1 tablet daily. 11.Optivar eyedrops twice a day. 12.Pulmicort nebulizer twice a day. 13.Calcium carbonate daily. 14.Vitamin D 2 tablets daily. 15.Flonase 2 sprays twice a day. 16.Hydroxyzine 25 mg q.4 h. p.r.n. 17.Namenda 10 mg b.i.d. 18.Singulair 10 mg daily. 19.Multivitamin 1 tablet at bedtime. 20.Prednisone 10 mg daily. 21.Avelox 400 mg 1 tablet for 5 days.  ADMISSION HISTORY:  This is an 77 year old female with history of dementia, had a fall, found by the daughter, was brought to the ER, and she was found to have a left hip fracture.  The patient was admitted for further evaluation and treatment.  For further details, please refer to H and P per Dr. [name] on July 4th.  HOSPITAL COURSE:  Left femoral neck fracture.  The patient underwent operative repair with the left hip hemiarthroplasty on  July 5th by Dr. [name], and she is on physical therapy.  She will be on weightbearing as tolerated, and she will follow with Dr. [name] in about 2 weeks after discharge.  Pneumonia.  The patient was having fever as well as elevated leukocyte count.  Chest x-ray was done, it showed possibly early developing pneumonia.  She was started on empiric antibiotics on vanco and Zosyn. Blood cultures were sent after the antibiotics.  Over the course of her hospitalization, she defervesced.  Antibiotics were transitioned to p.o. Avelox.  One of the blood culture report was positive for coagulase- negative staph, most likely a contaminant.  Her leukocytosis has persisted, although her clinical status has significantly improved. This is most likely secondary to being on chronic steroids.  Her leukocytosis is improving at this time.  She has been afebrile, she is clinically nonseptic.  She does not have any diarrhea and clinically appears to be at her base line, and she is at this time stable to be transferred to SNF rehab for continued physical therapy.  Asthma remained stable on bronchodilator therapy as well as on chronic steroid.  Continue with incentive spirometer and pulmonary hygiene.  The patient does not follow commands, does not appear  to have a strong cough reflex, and she is definitely at risk for postoperative pneumonia.  CONSULTATIONS:  Orthopedic consult from Nadara Mustard, MD  PROCEDURES DONE:  Left hemiarthroplasty on July 5th.  DIAGNOSTIC IMAGING: 1. X-ray of left hip upon admission shows left femoral neck fracture     with varus angulation. 2. CT head on admission shows advanced atrophy and small-vessel     disease. 3. CT of the C-spine shows no cervical spine fracture or traumatic     subluxation, advanced multilevel degenerative disk disease. 4. Chest x-ray on July 5th shows stable low lung volumes.  Chest x-ray     on July 7th shows increased right lower lobe airspace  disease. 5. Abdominal x-ray on July 7th shows gas-filled loops of colon     represents ileus. 6. CT abdomen and pelvis on July 8th shows small bilateral pleural     effusions with dependent atelectasis. 7. Chest x-ray on June 11, 2011, shows no significant change in     bibasilar airspace opacities or pleural effusions.  PHYSICAL EXAMINATION:  VITAL SIGNS: On the day of discharge, the patient's vitals include a temperature of 97.8, pulse is 63, respirations 18, blood pressure 134/78, saturating 96% on room air. General:  She is alert, afebrile, comfortable in no acute distress. CARDIOVASCULAR:  S1-S2. RESPIRATORY EXAM:  Decreased at bases, but chest clear to auscultation. ABDOMEN:  Soft, nontender, nondistended. EXTREMITIES:  No pedal edema.  The patient is hemodynamically stable for skilled nursing facility, stable for discharge to skilled nursing facility.  She is recommended to follow with the PCP in about 1-2 weeks, and also follow up with Dr. [name] in about 2 weeks.  FOLLOWUP:  Follow up CBC and basic metabolic panel in 1 week at the PCP's office or at the skilled nursing facility.          ______________________________ Kathlen Mody, MD     VA/MEDQ  D:  06/12/2011  T:  06/12/2011  Job:  [id number]  Electronically Signed by Kathlen Mody MD on 06/13/2011 08:32:56 PM

## 2011-06-26 NOTE — Progress Notes (Signed)
[name], [name]             ACCOUNT NO.:  [id number]  MEDICAL RECORD NO.:  [id number]  LOCATION:  1616                         FACILITY:  [facility]  PHYSICIAN:  Erick Blinks, MD     DATE OF BIRTH:  Feb 12, 1929                                PROGRESS NOTE   PRIMARY CARE PHYSICIAN: [name], MD  PULMONOLOGIST: [name], MD  CURRENT DIAGNOSES: 1. Left femoral neck fracture, status post left hip hemiarthroplasty. 2. Status post fall. 3. Advanced dementia. 4. Healthcare-acquired pneumonia. 5. Leukocytosis. 6. History of asthma, on chronic prednisone therapy. 7. Osteopenia. 8. Sick euthyroid syndrome. 9. One out of two positive blood cultures for coagulase-negative     staphylococcus.  DISCHARGE MEDICATIONS: These will be reconciled by the discharging physician.  ADMISSION HISTORY: This is an 77 year old African American female with history of dementia who lives at home, was at home when she had a fall.  She was found by her daughter on the floor.  She was having pain in her left lower extremity.  She was brought to the emergency room where she was found to have a left hip fracture.  The patient was admitted for further treatment.  For details, please refer to the history and physical per Dr. [name] on July 4.  HOSPITAL COURSE: 1. Left femoral neck fracture:  The patient underwent operative repair     with a left hip hemiarthroplasty on July 5 by Dr. [name].  She has     tolerated this well.  She has continued to work with Physical     Therapy. 2. Pneumonia:  The patient was having significant fever as well as     elevated leukocyte count.  A chest x-ray was done which showed a     possibly developing pneumonia.  The patient was started empirically     on antibiotics with vancomycin and Zosyn.  Blood cultures were     sent.  She has been afebrile.  Antibiotics at this time, Avelox.     Her blood culture one out of two was positive for coagulase-     negative  staph.  I am not sure of the significance of this,     although it may be a contaminant.  Her leukocytosis has persisted     although her clinical status has significantly improved.  She is     currently not febrile and appears to be at her baseline.  Her blood     pressure is stable.  She is not having any respiratory complaints     or any abdominal complaints.  Her differential on her CBC shows a     normal percentage of neutrophils.  The patient is also on chronic     steroid therapy for her asthma.  It appears to be unlikely that     chronic steroid use would cause such a significant leukocytosis,     although we will decrease her prednisone from 10 mg to 5 mg a day.     She is not having any diarrhea and clinically appears to be near     her baseline.  Once her leukocytosis has improved, then  arrangements can be made for transfer to a skilled nursing facility     for continued physical therapy. 3. Asthma:  This has remained stable.  She is on bronchodilator     therapy as well as steroids.  The patient is encouraged to use the    incentive spirometer to promote pulmonary hygiene although this has     been difficult for her due to her mental status.  The patient does     not follow commands and does not appear to have a strong cough     reflex.  She is definitely at risk for postoperative pneumonia.  CONSULTATIONS: Orthopedics, Dr. Aldean Baker.  PROCEDURES: Left hip hemiarthroplasty on July 5.  DIAGNOSTIC IMAGING: 1. X-ray of the left hip upon admission shows left femoral neck     fracture with varus angulation. 2. CT head on admission shows advanced atrophy and small-vessel     disease.  No acute intracranial findings, incompletely evaluated     left maxillary sinus. 3. CT of the C-spine on admission shows no cervical spine fracture or     traumatic subluxation, advanced multilevel degenerative disk     disease however. 4. Chest x-ray on July 5 shows stable low lung  volumes. 5. Chest x-ray July 7 shows increased right lower lobe airspace     disease and effusion.  This raises concern for infection.     Persistent elevation of left hemidiaphragm.  Persistent and     potentially increased dilation of the colon. 6. Abdominal x-ray on July 7 shows gas filled loops of colon may     represent ileus.  No evidence of mechanical obstruction or free     air. 7. CT abdomen and pelvis on July 8 shows small bilateral pleural     effusions with dependent atelectasis, cholecystectomy, normal     appendix.  Probable postoperative changes around the left hip,     clinically correlate.  There is no given history of recent     operation.  Right renal cyst, mild distention of small-bowel     without obstruction.  DISPOSITION: The patient will be discharged to a skilled nursing facility once medically stable.  That brings [place] up-to-date in the hospital course.     Erick Blinks, MD     JM/MEDQ  D:  06/10/2011  T:  06/10/2011  Job:  [id number]  Electronically Signed by Durward Mallard Tamiki Kuba  on 06/26/2011 01:30:33 AM

## 2011-07-02 ENCOUNTER — Inpatient Hospital Stay (HOSPITAL_COMMUNITY)
Admission: EM | Admit: 2011-07-02 | Discharge: 2011-07-07 | DRG: 872 | Disposition: A | Discharge status: Skilled Nursing Facility | Payer: Medicare HMO | Attending: Internal Medicine | Admitting: Internal Medicine

## 2011-07-02 ENCOUNTER — Emergency Department (HOSPITAL_COMMUNITY): Payer: Medicare HMO

## 2011-07-02 DIAGNOSIS — Z96649 Presence of unspecified artificial hip joint: Secondary | ICD-10-CM

## 2011-07-02 DIAGNOSIS — A419 Sepsis, unspecified organism: Secondary | ICD-10-CM | POA: Diagnosis present

## 2011-07-02 DIAGNOSIS — J45909 Unspecified asthma, uncomplicated: Secondary | ICD-10-CM | POA: Diagnosis present

## 2011-07-02 DIAGNOSIS — I10 Essential (primary) hypertension: Secondary | ICD-10-CM | POA: Diagnosis present

## 2011-07-02 DIAGNOSIS — E872 Acidosis, unspecified: Secondary | ICD-10-CM | POA: Diagnosis present

## 2011-07-02 DIAGNOSIS — N390 Urinary tract infection, site not specified: Secondary | ICD-10-CM | POA: Diagnosis present

## 2011-07-02 DIAGNOSIS — IMO0002 Reserved for concepts with insufficient information to code with codable children: Secondary | ICD-10-CM

## 2011-07-02 DIAGNOSIS — F0390 Unspecified dementia without behavioral disturbance: Secondary | ICD-10-CM | POA: Diagnosis present

## 2011-07-02 DIAGNOSIS — I519 Heart disease, unspecified: Secondary | ICD-10-CM | POA: Diagnosis present

## 2011-07-02 DIAGNOSIS — M810 Age-related osteoporosis without current pathological fracture: Secondary | ICD-10-CM | POA: Diagnosis present

## 2011-07-02 DIAGNOSIS — R109 Unspecified abdominal pain: Secondary | ICD-10-CM | POA: Diagnosis present

## 2011-07-02 DIAGNOSIS — E876 Hypokalemia: Secondary | ICD-10-CM | POA: Diagnosis present

## 2011-07-02 LAB — CBC
Hemoglobin: 11.8 g/dL — ABNORMAL LOW (ref 12.0–15.0)
MCHC: 31.4 g/dL (ref 30.0–36.0)
RBC: 4.23 MIL/uL (ref 3.87–5.11)
WBC: 15.9 10*3/uL — ABNORMAL HIGH (ref 4.0–10.5)

## 2011-07-02 LAB — URINALYSIS, ROUTINE W REFLEX MICROSCOPIC
Glucose, UA: NEGATIVE mg/dL
Leukocytes, UA: NEGATIVE
Protein, ur: NEGATIVE mg/dL

## 2011-07-02 LAB — BASIC METABOLIC PANEL
CO2: 25 mEq/L (ref 19–32)
GFR calc non Af Amer: >60 mL/min (ref >60)
Glucose, Bld: 165 mg/dL — ABNORMAL HIGH (ref 70–99)
Potassium: 3.5 mEq/L (ref 3.5–5.1)
Sodium: 138 mEq/L (ref 135–145)

## 2011-07-02 LAB — URINE MICROSCOPIC-ADD ON

## 2011-07-02 LAB — DIFFERENTIAL
Basophils Absolute: 0 10*3/uL (ref 0.0–0.1)
Basophils Relative: 0 % (ref 0–1)
Neutro Abs: 11.7 10*3/uL — ABNORMAL HIGH (ref 1.7–7.7)
Neutrophils Relative %: 74 % (ref 43–77)

## 2011-07-03 ENCOUNTER — Inpatient Hospital Stay (HOSPITAL_COMMUNITY): Payer: Medicare HMO

## 2011-07-03 LAB — CARDIAC PANEL(CRET KIN+CKTOT+MB+TROPI)
CK, MB: 0.9 ng/mL (ref 0.3–4.0)
CK, MB: 1 ng/mL (ref 0.3–4.0)
Relative Index: INVALID (ref 0.0–2.5)
Relative Index: INVALID (ref 0.0–2.5)
Relative Index: INVALID (ref 0.0–2.5)
Total CK: 16 U/L (ref 7–177)
Troponin I: <0.3 ng/mL (ref <0.30)
Troponin I: <0.3 ng/mL (ref <0.30)

## 2011-07-03 LAB — CBC
HCT: 32.1 % — ABNORMAL LOW (ref 36.0–46.0)
Hemoglobin: 10 g/dL — ABNORMAL LOW (ref 12.0–15.0)
MCH: 27.5 pg (ref 26.0–34.0)
MCV: 88.4 fL (ref 78.0–100.0)
RBC: 3.63 MIL/uL — ABNORMAL LOW (ref 3.87–5.11)

## 2011-07-03 LAB — TSH: TSH: 1.308 u[IU]/mL (ref 0.350–4.500)

## 2011-07-03 LAB — HEPATIC FUNCTION PANEL
AST: 23 U/L (ref 0–37)
Albumin: 2.4 g/dL — ABNORMAL LOW (ref 3.5–5.2)
Total Bilirubin: 1 mg/dL (ref 0.3–1.2)
Total Protein: 6 g/dL (ref 6.0–8.3)

## 2011-07-03 LAB — COMPREHENSIVE METABOLIC PANEL
AST: 25 U/L (ref 0–37)
Albumin: 2.1 g/dL — ABNORMAL LOW (ref 3.5–5.2)
Alkaline Phosphatase: 93 U/L (ref 39–117)
Chloride: 104 mEq/L (ref 96–112)
Creatinine, Ser: 0.69 mg/dL (ref 0.50–1.10)
Potassium: 3.9 mEq/L (ref 3.5–5.1)
Total Bilirubin: 1 mg/dL (ref 0.3–1.2)
Total Protein: 5.6 g/dL — ABNORMAL LOW (ref 6.0–8.3)

## 2011-07-03 LAB — DIFFERENTIAL
Lymphocytes Relative: 10 % — ABNORMAL LOW (ref 12–46)
Lymphs Abs: 1.3 10*3/uL (ref 0.7–4.0)
Monocytes Relative: 5 % (ref 3–12)
Neutro Abs: 11 10*3/uL — ABNORMAL HIGH (ref 1.7–7.7)
Neutrophils Relative %: 84 % — ABNORMAL HIGH (ref 43–77)

## 2011-07-03 LAB — AMMONIA: Ammonia: 31 umol/L (ref 11–60)

## 2011-07-04 LAB — BASIC METABOLIC PANEL
Chloride: 106 mEq/L (ref 96–112)
GFR calc Af Amer: >60 mL/min (ref >60)
GFR calc non Af Amer: >60 mL/min (ref >60)
Potassium: 3.1 mEq/L — ABNORMAL LOW (ref 3.5–5.1)
Sodium: 141 mEq/L (ref 135–145)

## 2011-07-04 LAB — CBC
HCT: 33.7 % — ABNORMAL LOW (ref 36.0–46.0)
Hemoglobin: 10.6 g/dL — ABNORMAL LOW (ref 12.0–15.0)
MCHC: 31.5 g/dL (ref 30.0–36.0)
RDW: 15.3 % (ref 11.5–15.5)
WBC: 13.6 10*3/uL — ABNORMAL HIGH (ref 4.0–10.5)

## 2011-07-04 LAB — URINE CULTURE: Culture  Setup Time: 201208012140

## 2011-07-05 LAB — CBC
Hemoglobin: 11 g/dL — ABNORMAL LOW (ref 12.0–15.0)
Platelets: 207 10*3/uL (ref 150–400)
RBC: 3.87 MIL/uL (ref 3.87–5.11)
WBC: 14.7 10*3/uL — ABNORMAL HIGH (ref 4.0–10.5)

## 2011-07-05 LAB — BASIC METABOLIC PANEL
CO2: 25 mEq/L (ref 19–32)
Chloride: 108 mEq/L (ref 96–112)
Glucose, Bld: 92 mg/dL (ref 70–99)
Potassium: 3.6 mEq/L (ref 3.5–5.1)
Sodium: 140 mEq/L (ref 135–145)

## 2011-07-08 LAB — CULTURE, BLOOD (ROUTINE X 2)
Culture  Setup Time: 201208012131
Culture: NO GROWTH

## 2011-07-08 NOTE — Discharge Summary (Signed)
[name], [name]             ACCOUNT NO.:  [id number]  MEDICAL RECORD NO.:  [id number]  LOCATION:  1430                         FACILITY:  [facility]  PHYSICIAN:  Hillery Aldo, M.D.   DATE OF BIRTH:  05/18/1929  DATE OF ADMISSION:  07/02/2011 DATE OF DISCHARGE:  07/06/2011                              DISCHARGE SUMMARY   PRIMARY CARE PHYSICIAN:  [name], MD  PULMONOLOGIST:  [name], MD  ORTHOPEDIC SURGEON:  Nadara Mustard, MD  DISCHARGE DIAGNOSES: 1. Gram-positive cocci bacteremia, likely contaminant. 2. Sepsis secondary to Pseudomonas urinary tract infection. 3. Hypokalemia. 4. Hypertension. 5. Abdominal pain, resolved. 6. Recent left hip fracture, status post hemiarthroplasty. 7. Recent hospital-acquired pneumonia. 8. Advanced dementia. 9. Chronic asthma, on chronic steroid therapy. 10.Osteoporosis. 11.Lactic acidosis secondary to sepsis, resolved. 12.Mild diastolic dysfunction by echocardiogram with no evidence of     congestive heart failure.  DISCHARGE MEDICATIONS: 1. Cipro 500 mg p.o. q.12 h for four more days. 2. Protonix 40 mg p.o. daily. 3. Albuterol nebulization one treatment q.12 h p.r.n., 0.63%. 4. Ipratropium 0.2 mg/mL 0.5 mg inhaled q.12 h p.r.n. shortness of     breath. 5. Optivar 0.05% 1 drop in both eyes b.i.d. 6. Aspirin 81 mg p.o. daily. 7. Dulcolax 10 mg suppositories intrarectally daily at bedtime p.r.n.     constipation. 8. Calcium carbonate 1250 mg p.o. daily. 9. Flonase 2 sprays intranasally b.i.d. p.r.n. 10.Robitussin syrup 100 mg/5 mL, 20 mL p.o. q.8 h. 11.Hydroxyzine 25 mg p.o. q.4 h p.r.n. 12.Lasix 20 mg p.o. daily. 13.Multivitamin 1 tablet p.o. at bedtime. 14.Med Pass supplement 180 mL p.o. b.i.d. 15.Metoprolol 25 mg p.o. b.i.d. 16.Namenda 10 mg p.o. b.i.d. 17.Percocet 5/325 1 tablet p.o. q.6 h p.r.n. pain. 18.Prednisone 10 mg p.o. daily. 19.Pulmicort 0.5 mg/2 mL one nebulizer inhaled b.i.d. 20.MiraLAX 17 g p.o.  daily. 21.Singulair 10 mg p.o. at bedtime. 22.Vitamin C 500 mg p.o. daily. 23.Vitamin D3 1000 units 2 tablets p.o. daily.  CONSULTATIONS:  None.  BRIEF ADMISSION HPI:  The patient is an 77 year old female who was recently discharged to a rehabilitation facility for progressive physical therapy secondary to a recent left hip hemiarthroplasty.  The patient also had recent healthcare-associated pneumonia.  The patient was doing well in rehabilitation when she developed some complaints of abdominal pain and because of her recent surgery and pneumonia, a blood culture was done.  Blood culture grew gram-positive cocci and the patient was found to have leukocytosis so she was transferred to the hospital for further evaluation.  For the full details, please see the dictated report done by Dr. Toniann Fail.  PROCEDURES AND DIAGNOSTIC STUDIES: 1. Chest x-ray on July 02, 2011, showed possible left basilar     atelectasis with persistent elevation of the left hemidiaphragm but     no evidence of acute cardiopulmonary disease. 2. CT scan of the head on July 03, 2011, showed global atrophy with     moderate small-vessel disease.  No CT evidence of large acute     infarct and no intracranial hemorrhage.  There was an air fluid     level of the left maxillary sinus and minimal mucosal thickening of  the right sphenoid sinus as well as partial opacification in the     right ethmoid sinus air cells. 3. CT scan of the abdomen and pelvis on July 03, 2011, showed no     acute abdominal or pelvic process.  Large stool ball in the rectum     noted. 4. Two views of the pelvis on July 03, 2011, showed no acute     abnormalities of the pelvis.  Seroma in the soft tissues of the     left buttock. 5. Two-dimensional echocardiogram on July 03, 2011, showed normal     systolic function with an ejection fraction estimated at 60% to     65%.  No regional wall motion abnormalities noted.  Grade 1      diastolic dysfunction noted.  DISCHARGE LABORATORY VALUES:  Blood cultures were negative.  Sodium was 140, potassium 3.6, chloride 108, bicarb 25, BUN 7, creatinine 0.74, glucose 92, calcium 9.2.  White blood cell count was 14.7, hemoglobin 11, hematocrit 34, platelets 207.  Urine cultures grew greater than 100,000 colonies of pseudomonas, sensitive to Cipro.  TSH was 1.308. Cardiac markers were negative.  Serum ammonia was 31.  HOSPITAL COURSE: 1. Gram-positive cocci bacteremia:  This was felt to be a contaminant.     Only one culture was done.  Nevertheless, the patient was     recultured with two blood cultures and empirically put on     vancomycin.  The patient's cultures which were drawn on July 02, 2011, have been negative to date but are not yet final.  At this     point, the patient has no evidence of sepsis and was nontoxic-     appearing throughout her hospital stay.  An echocardiogram was done     to rule out endocarditis as well with no vegetations appreciated. 2. Sepsis secondary to pseudomonas urinary tract infection:  The     patient's presenting symptoms were most likely due to a urinary     tract infection.  She was also put empirically on Zosyn and once     culture data was finalized, her antibiotic regimen was switched to     Cipro.  The patient will complete an additional 4 days of therapy     with Cipro for a total treatment course of 7 days. 3. Hypokalemia:  The patient's potassium was fully repleted. 4. Hypertension:  The patient can safely resume her prehospital     medications. 5. Abdominal pain:  The patient's abdominal pain was most likely from     her urinary tract infection.  Would also recommend ongoing     treatment with proton-pump inhibitor therapy given her chronic     steroid use as well as aspirin use for gastric protection. 6. Recent left hip fracture, status post hemiarthroplasty:  The     patient's surgical site looks good and she can follow  up with her     orthopedic surgeon as recommended. 7. Recent hospital-acquired pneumonia:  The patient did not have any     clinical signs of pneumonia and her chest x-ray was clear. 8. Advanced dementia:  The patient was continued on her usual dose of     Namenda. 9. Chronic asthma on chronic steroid therapy:  The patient was     initially put on stress dose steroids but it became clear shortly     after admission that she was ventilating well with no evidence of  bronchospasm.  She was put back on her maintenance dose of     prednisone which she can continue.  She can continue nebulized     bronchodilator therapy b.i.d. p.r.n. per the patient's daughter's     request as she feels that more frequent nebulizer treatments are     exacerbating her abdominal issues. 10.Osteoporosis:  The patient's daughter also feels that bone-building     therapies were aggravating her GI complaints.  The patient was not     on bone-building therapies upon admission so I assume her treating     physician had already stopped these.  She can continue her calcium     supplementation. 11.Lactic acidosis secondary to sepsis and secondary to urinary tract     infection with pseudomonas:  The patient's lactic acid normalized     after 24 hours of antibiotic therapy. 12.Mild diastolic dysfunction by echocardiogram:  No congestive heart     failure clinically with an EF estimated at 60% to 65%.  DISPOSITION:  The patient is medically stable and will be discharged back to her skilled nursing facility for ongoing rehabilitation.  DISCHARGE INSTRUCTIONS: 1. Activity:  Increase activity slowly, walk with assistance,     progressive ambulation with walker. 2. Diet:  Heart-healthy. 3. Wound Care:  No specific wound care.  Follow up with orthopod     p.r.n. or if wound begins to look infected.  Time spent coordinating care for discharge, discharge instructions including face-to-face time, equals 35  minutes.     Hillery Aldo, M.D.     CR/MEDQ  D:  07/06/2011  T:  07/06/2011  Job:  [id number]  cc:   [name], MD [address] [place] [address]  [name], MD, FCCP [address] [place] [address]  Nadara Mustard, MD Fax: [phone]  Electronically Signed by Hillery Aldo M.D. on 07/08/2011 07:12:51 AM

## 2011-07-10 ENCOUNTER — Ambulatory Visit: Payer: Medicare PPO | Admitting: Internal Medicine

## 2011-07-11 ENCOUNTER — Ambulatory Visit (INDEPENDENT_AMBULATORY_CARE_PROVIDER_SITE_OTHER): Payer: Medicare HMO | Admitting: Family Medicine

## 2011-07-11 ENCOUNTER — Encounter: Payer: Self-pay | Admitting: Family Medicine

## 2011-07-11 VITALS — BP 130/76 | HR 68 | Temp 98.5°F

## 2011-07-11 DIAGNOSIS — F0390 Unspecified dementia without behavioral disturbance: Secondary | ICD-10-CM

## 2011-07-11 DIAGNOSIS — N390 Urinary tract infection, site not specified: Secondary | ICD-10-CM

## 2011-07-11 NOTE — Progress Notes (Signed)
  Subjective:    Patient ID: [name], female    DOB: 08/29/1929, 77 y.o.   MRN: [id number]  HPI Here with daughter to follow up a stay at [facility] from 07-02-11 to 07-06-11 for urosepsis due to Pseudomonas. She was discharged to [facility] where is staying now for rehab. She has finished a course of Cipro. Now she is weak but appears to be pain free. Her mental status has declined since I last saw her. Her daughter still hopes to get her back to her home at some point, but this is looking less likely.   Review of Systems  Respiratory: Negative.   Cardiovascular: Negative.   Neurological: Positive for weakness.  Psychiatric/Behavioral: Positive for confusion. Negative for agitation.       Objective:   Physical Exam  Constitutional:       Lethargic, in wheelchair  Cardiovascular: Normal rate, regular rhythm, normal heart sounds and intact distal pulses.   Pulmonary/Chest: Effort normal and breath sounds normal.          Assessment & Plan:  Her UTI seems to be resolved. She needs to drink plenty of water at all times. Her dementia has advanced recently. I advised the daughter that she should seriously consider moving her to a permanent nursing facility, and that it would be extremely difficult to try to move her back home.

## 2011-07-16 ENCOUNTER — Encounter (HOSPITAL_COMMUNITY): Payer: Medicare HMO

## 2011-07-16 ENCOUNTER — Inpatient Hospital Stay (HOSPITAL_COMMUNITY)
Admission: EM | Admit: 2011-07-16 | Discharge: 2011-07-21 | DRG: 300 | Disposition: A | Discharge status: Home Health | Payer: Medicare HMO | Source: Ambulatory Visit | Attending: Family Medicine | Admitting: Family Medicine

## 2011-07-16 ENCOUNTER — Emergency Department (HOSPITAL_COMMUNITY): Payer: Medicare HMO

## 2011-07-16 DIAGNOSIS — I824Z9 Acute embolism and thrombosis of unspecified deep veins of unspecified distal lower extremity: Principal | ICD-10-CM | POA: Diagnosis present

## 2011-07-16 DIAGNOSIS — E876 Hypokalemia: Secondary | ICD-10-CM | POA: Diagnosis present

## 2011-07-16 DIAGNOSIS — Z86718 Personal history of other venous thrombosis and embolism: Secondary | ICD-10-CM

## 2011-07-16 DIAGNOSIS — E669 Obesity, unspecified: Secondary | ICD-10-CM | POA: Diagnosis present

## 2011-07-16 DIAGNOSIS — Z79899 Other long term (current) drug therapy: Secondary | ICD-10-CM

## 2011-07-16 DIAGNOSIS — I10 Essential (primary) hypertension: Secondary | ICD-10-CM | POA: Diagnosis present

## 2011-07-16 DIAGNOSIS — T380X5A Adverse effect of glucocorticoids and synthetic analogues, initial encounter: Secondary | ICD-10-CM | POA: Diagnosis present

## 2011-07-16 DIAGNOSIS — E0781 Sick-euthyroid syndrome: Secondary | ICD-10-CM | POA: Diagnosis present

## 2011-07-16 DIAGNOSIS — N390 Urinary tract infection, site not specified: Secondary | ICD-10-CM | POA: Diagnosis present

## 2011-07-16 DIAGNOSIS — R1310 Dysphagia, unspecified: Secondary | ICD-10-CM | POA: Diagnosis present

## 2011-07-16 DIAGNOSIS — Z86711 Personal history of pulmonary embolism: Secondary | ICD-10-CM

## 2011-07-16 DIAGNOSIS — I959 Hypotension, unspecified: Secondary | ICD-10-CM | POA: Diagnosis not present

## 2011-07-16 DIAGNOSIS — D72829 Elevated white blood cell count, unspecified: Secondary | ICD-10-CM | POA: Diagnosis present

## 2011-07-16 DIAGNOSIS — F0390 Unspecified dementia without behavioral disturbance: Secondary | ICD-10-CM | POA: Diagnosis present

## 2011-07-16 DIAGNOSIS — M79609 Pain in unspecified limb: Secondary | ICD-10-CM

## 2011-07-16 DIAGNOSIS — Z96649 Presence of unspecified artificial hip joint: Secondary | ICD-10-CM

## 2011-07-16 DIAGNOSIS — I824Y9 Acute embolism and thrombosis of unspecified deep veins of unspecified proximal lower extremity: Secondary | ICD-10-CM | POA: Diagnosis present

## 2011-07-16 DIAGNOSIS — T183XXA Foreign body in small intestine, initial encounter: Secondary | ICD-10-CM | POA: Diagnosis present

## 2011-07-16 DIAGNOSIS — Z7982 Long term (current) use of aspirin: Secondary | ICD-10-CM

## 2011-07-16 DIAGNOSIS — IMO0002 Reserved for concepts with insufficient information to code with codable children: Secondary | ICD-10-CM | POA: Diagnosis present

## 2011-07-16 DIAGNOSIS — J4489 Other specified chronic obstructive pulmonary disease: Secondary | ICD-10-CM | POA: Diagnosis present

## 2011-07-16 DIAGNOSIS — J449 Chronic obstructive pulmonary disease, unspecified: Secondary | ICD-10-CM | POA: Diagnosis present

## 2011-07-16 DIAGNOSIS — M810 Age-related osteoporosis without current pathological fracture: Secondary | ICD-10-CM | POA: Diagnosis present

## 2011-07-16 DIAGNOSIS — Z7901 Long term (current) use of anticoagulants: Secondary | ICD-10-CM

## 2011-07-16 LAB — DIFFERENTIAL
Basophils Absolute: 0 10*3/uL (ref 0.0–0.1)
Lymphocytes Relative: 19 % (ref 12–46)
Lymphs Abs: 3.4 10*3/uL (ref 0.7–4.0)
Monocytes Absolute: 1.7 10*3/uL — ABNORMAL HIGH (ref 0.1–1.0)
Monocytes Relative: 10 % (ref 3–12)
Neutro Abs: 12.4 10*3/uL — ABNORMAL HIGH (ref 1.7–7.7)

## 2011-07-16 LAB — CBC
HCT: 32.6 % — ABNORMAL LOW (ref 36.0–46.0)
Hemoglobin: 10.7 g/dL — ABNORMAL LOW (ref 12.0–15.0)
MCHC: 32.8 g/dL (ref 30.0–36.0)
MCV: 85.8 fL (ref 78.0–100.0)

## 2011-07-16 LAB — POCT I-STAT, CHEM 8
BUN: 24 mg/dL — ABNORMAL HIGH (ref 6–23)
Chloride: 106 mEq/L (ref 96–112)
Creatinine, Ser: 1.2 mg/dL — ABNORMAL HIGH (ref 0.50–1.10)
Hemoglobin: 11.9 g/dL — ABNORMAL LOW (ref 12.0–15.0)
Potassium: 3.7 mEq/L (ref 3.5–5.1)
Sodium: 141 mEq/L (ref 135–145)

## 2011-07-16 LAB — PROTIME-INR: INR: 1.16 (ref 0.00–1.49)

## 2011-07-17 LAB — DIFFERENTIAL
Basophils Absolute: 0.1 10*3/uL (ref 0.0–0.1)
Basophils Relative: 0 % (ref 0–1)
Eosinophils Relative: 4 % (ref 0–5)
Monocytes Absolute: 1.7 10*3/uL — ABNORMAL HIGH (ref 0.1–1.0)
Neutro Abs: 8.8 10*3/uL — ABNORMAL HIGH (ref 1.7–7.7)

## 2011-07-17 LAB — CK TOTAL AND CKMB (NOT AT ARMC)
CK, MB: 1.2 ng/mL (ref 0.3–4.0)
Relative Index: INVALID (ref 0.0–2.5)

## 2011-07-17 LAB — CBC
Hemoglobin: 10 g/dL — ABNORMAL LOW (ref 12.0–15.0)
MCHC: 31.8 g/dL (ref 30.0–36.0)
RDW: 15.3 % (ref 11.5–15.5)

## 2011-07-17 LAB — CARDIAC PANEL(CRET KIN+CKTOT+MB+TROPI)
CK, MB: 1.3 ng/mL (ref 0.3–4.0)
Relative Index: INVALID (ref 0.0–2.5)
Total CK: 26 U/L (ref 7–177)

## 2011-07-18 ENCOUNTER — Telehealth: Payer: Self-pay | Admitting: *Deleted

## 2011-07-18 LAB — URINALYSIS, ROUTINE W REFLEX MICROSCOPIC
Glucose, UA: NEGATIVE mg/dL
Leukocytes, UA: NEGATIVE
Specific Gravity, Urine: 1.022 (ref 1.005–1.030)
Urobilinogen, UA: 1 mg/dL (ref 0.0–1.0)

## 2011-07-18 LAB — URINE MICROSCOPIC-ADD ON

## 2011-07-18 LAB — GLUCOSE, CAPILLARY: Glucose-Capillary: 85 mg/dL (ref 70–99)

## 2011-07-18 NOTE — Telephone Encounter (Signed)
Pt's daughter called to let Dr. Clent Ridges know her Mom is in [facility] hospital with a DVT.  She will be discharged in a few days, and wants Dr. Clent Ridges to set up Home Health.  She is taking her home.  Please call and confirm that this has been done, please.

## 2011-07-18 NOTE — Telephone Encounter (Signed)
This should be ordered by the doctors at the hospital. I cannot order something if I do not know what is going on. Otherwise I would need to see her after discharge and then go from there

## 2011-07-19 ENCOUNTER — Inpatient Hospital Stay (HOSPITAL_COMMUNITY): Payer: Medicare HMO

## 2011-07-19 LAB — URINE CULTURE
Colony Count: 3000
Culture  Setup Time: 201208172229

## 2011-07-19 LAB — CREATININE, SERUM: GFR calc Af Amer: >60 mL/min (ref >60)

## 2011-07-19 LAB — CBC
Hemoglobin: 9.7 g/dL — ABNORMAL LOW (ref 12.0–15.0)
MCH: 27.7 pg (ref 26.0–34.0)
MCHC: 32.6 g/dL (ref 30.0–36.0)
MCV: 85.1 fL (ref 78.0–100.0)

## 2011-07-19 LAB — PROTIME-INR: Prothrombin Time: 16.7 seconds — ABNORMAL HIGH (ref 11.6–15.2)

## 2011-07-19 NOTE — H&P (Signed)
[name], [name]             ACCOUNT NO.:  [id number]  MEDICAL RECORD NO.:  [id number]  LOCATION:  [facility]                         FACILITY:  [facility]  PHYSICIAN:  Homero Fellers, MD   DATE OF BIRTH:  [birth year]  DATE OF ADMISSION:  07/16/2011 DATE OF DISCHARGE:                             HISTORY & PHYSICAL   PRIMARY CARE PHYSICIAN:  [name], MD  CHIEF COMPLAINT:  Right lower extremity swelling.  HISTORY OF PRESENT ILLNESS:  This is an 77 year old African American woman with advanced dementia who had a left femoral neck fracture last month and was treated with left hip hemiarthroplasty and subsequently discharged to nursing home.  Going by the discharge summary, the patient was not given any anticoagulation at the time of her discharge.  She came back about 2 weeks ago and was treated at that time for bacteremia, which was thought to probably be a contaminant and was discharged back to nursing home on Avelox.  The patient also had healthcare-related pneumonia at admission in July.  She did not have any definite chest pain or shortness of breath today.  Preliminary results of Doppler ultrasound showed an acute DVT in the right leg.  The patient report is not available at this time.  The patient has prior history of deep venous thrombosis and pulmonary embolism about 3 years ago for which she was anticoagulated for about 2-1/2 years.  She got off Coumadin spring of this year.  The patient is mostly nonverbal because of dementia and most of the information was obtained from her daughter who was at the bedside.  There is no reported fever, cough, back pain, headache, chest pain, nausea, or vomiting.  The patient has not ambulated much since discharge back to the nursing home.  PAST MEDICAL HISTORY:  History of asthma versus COPD, advanced dementia, osteoporosis, chronic steroid use for asthma, recent hospital-acquired pneumonia, recent positive blood cultures with  coagulase-negative staph on identification, left hip hemiarthroplasty, hypertension.  MEDICATIONS:  This needs to be verified include albuterol, prednisone, Namenda, Lasix, metoprolol, Singulair, guaifenesin, budesonide, azelastine ophthalmic, multivitamin, and vitamin C.  ALLERGIES:  None.  SOCIAL HISTORY:  No smoking or alcohol use.  She currently lives at nursing home.  The 10-point review of systems is limited because of advanced dementia.  PHYSICAL EXAMINATION:  VITAL SIGNS:  Blood pressure of 124/65, pulse 66, respirations 18, temperature 97.91, O2 sat 93% on room air. GENERAL:  The patient looks comfortable, appeared to be in no distress. Does not follow much commands. EYES:  Open, pupils reactive to light and accommodation. NECK:  Supple.  No JVD, adenopathy, or thyromegaly. LUNGS:  Clear anteriorly to auscultation with no wheezing or crackles. HEART:  S1 and S2 with regular rate and rhythm.  No murmurs, rubs, or gallops. ABDOMEN:  Obese, soft, nontender.  Bowel sounds present.  No masses. EXTREMITIES:  Right leg, especially the calf area is definitely swollen, not tender, pulses difficult to palpate left.  Leg appear normal, and no cyanosis or clubbing. SKIN:  No rash or lesion. LYMPHATICS:  Normal lymph glands with no swelling. PSYCHIATRIC:  Affect is flat.  LABORATORY DATA:  White count is 17.7, probably secondary to chronic  steroid use, hemoglobin 10.7, platelet count 167.  Chemistry, sodium 141, potassium 3.7, BUN 24, creatinine 1.2, glucose 85.  Chest x-ray showed borderline cardiomegaly without edema.  There is indistinct opacity along the left hemidiaphragm and in the right midlung, probably atelectasis.  ASSESSMENT:  A 77 year old woman from nursing home admitted with: 1. Acute right lower extremity deep venous thrombosis. 2. History of previous deep venous thrombosis and pulmonary embolism. 3. Chronic obstructive pulmonary disease, which appears  compensated. 4. Leukocytosis likely secondary to chronic steroid use. 5. Advanced dementia. 6. Hypertension, appeared compensated.  PLAN:  Admit to medical bed, start Lovenox 1 mg/kg subcu q.12 and Coumadin.  Followup PT and INR.  The patient may need anticoagulation for at least 1 year in view of history of known PE.  We suspect this is a provoked thromboembolic process since she had recent hip surgery and has been relatively immobilized.  Repeat two sets of blood cultures in view of recent bacteremia.  I do not see any reason to start any antibiotics at this time, but the patient to be closely followed.  CBC should be repeated in the morning to ensure stability.  Home medicine should be continued.  Her condition is stable.     Homero Fellers, MD     FA/MEDQ  D:  07/16/2011  T:  07/17/2011  Job:  [id number]  Electronically Signed by Homero Fellers  on 07/19/2011 06:26:26 AM

## 2011-07-20 ENCOUNTER — Inpatient Hospital Stay (HOSPITAL_COMMUNITY): Payer: Medicare HMO

## 2011-07-20 LAB — BASIC METABOLIC PANEL
BUN: 8 mg/dL (ref 6–23)
CO2: 27 mEq/L (ref 19–32)
Chloride: 105 mEq/L (ref 96–112)
Creatinine, Ser: 0.92 mg/dL (ref 0.50–1.10)
Glucose, Bld: 87 mg/dL (ref 70–99)
Potassium: 3.4 mEq/L — ABNORMAL LOW (ref 3.5–5.1)

## 2011-07-20 MED ORDER — IOHEXOL 300 MG/ML  SOLN
100.0000 mL | Freq: Once | INTRAMUSCULAR | Status: AC | PRN
  Administered 2011-07-20: 100 mL via INTRAVENOUS

## 2011-07-21 LAB — CBC
HCT: 30.8 % — ABNORMAL LOW (ref 36.0–46.0)
Hemoglobin: 9.9 g/dL — ABNORMAL LOW (ref 12.0–15.0)
MCHC: 32.1 g/dL (ref 30.0–36.0)
MCV: 86 fL (ref 78.0–100.0)
RDW: 15.4 % (ref 11.5–15.5)

## 2011-07-21 LAB — CREATININE, SERUM: GFR calc Af Amer: >60 mL/min (ref >60)

## 2011-07-22 ENCOUNTER — Telehealth: Payer: Self-pay | Admitting: *Deleted

## 2011-07-22 NOTE — Telephone Encounter (Signed)
I tried to call daughter and no answer & no option to leave a message.

## 2011-07-22 NOTE — Telephone Encounter (Signed)
Pt is 38.7 and inr 3.2//pt takes lovenox 70 every 12 hours and coumadin 5 mg qd--daughter , karens number is [phone]

## 2011-07-23 ENCOUNTER — Telehealth: Payer: Self-pay

## 2011-07-23 LAB — CULTURE, BLOOD (ROUTINE X 2)
Culture: NO GROWTH
Culture: NO GROWTH

## 2011-07-23 NOTE — Telephone Encounter (Signed)
Has she been discharged yet? If so, I need to see her in the office to give them any advice

## 2011-07-23 NOTE — Telephone Encounter (Signed)
Left message on machine For daughter,karen to please call and make ov for f/u from hospitall-

## 2011-07-23 NOTE — Telephone Encounter (Signed)
Pt's daughter says she will call back to schedule appointment if she can get someone to bring pt to the office

## 2011-07-24 ENCOUNTER — Telehealth: Payer: Self-pay | Admitting: Family Medicine

## 2011-07-24 NOTE — Telephone Encounter (Signed)
[facility] would like a verbal order for a rolling walker & bedside commode. Phone # is [phone] ext [phone].

## 2011-07-25 ENCOUNTER — Ambulatory Visit: Payer: Medicare HMO | Admitting: Family Medicine

## 2011-07-25 ENCOUNTER — Encounter: Payer: Self-pay | Admitting: Family Medicine

## 2011-07-25 ENCOUNTER — Telehealth: Payer: Self-pay | Admitting: Family Medicine

## 2011-07-25 ENCOUNTER — Ambulatory Visit (INDEPENDENT_AMBULATORY_CARE_PROVIDER_SITE_OTHER): Payer: Medicare HMO | Admitting: Family Medicine

## 2011-07-25 VITALS — BP 102/70 | HR 58 | Temp 98.5°F

## 2011-07-25 DIAGNOSIS — N390 Urinary tract infection, site not specified: Secondary | ICD-10-CM

## 2011-07-25 DIAGNOSIS — I82409 Acute embolism and thrombosis of unspecified deep veins of unspecified lower extremity: Secondary | ICD-10-CM

## 2011-07-25 NOTE — Telephone Encounter (Signed)
I called and left message for the order and script faxed.

## 2011-07-25 NOTE — Telephone Encounter (Signed)
She should be on 5 mg a day of Coumadin per her DC summary. Her INR yesterday was 3.97 so she is therapeutic. She can stop the Lovenox now, and she should hold the Coumadin for 3 days. Then resume the Coumadin taking 5mg  (a whole tab) on Mon, Wed, and Fri. Take 2.5 mg (a half tab) on the other days. Recheck an INR in one week

## 2011-07-25 NOTE — Telephone Encounter (Signed)
Pt's daughter called and wants to know the coumadin dose after having labs done? She was going to bring pt in today but the transportation service must have a 24 hour notice.

## 2011-07-25 NOTE — Telephone Encounter (Signed)
This requires a written order, which I have done. Please fax this in

## 2011-07-25 NOTE — Telephone Encounter (Signed)
Pt is here for the appointment and Dr. Clent Ridges will be going over the information below.

## 2011-07-25 NOTE — Progress Notes (Signed)
  Subjective:    Patient ID: [name], female    DOB: 1929/07/07, 77 y.o.   MRN: [id number]  HPI Here to follow up after a hospital stay from 07-16-11 to 07-21-11 for a UTI and for a DVT in the right lower leg. She presented to the ER with pain and swelling in the right calf, and dopplers revealed a recurrent DVT. She ruled out for chest or pelvic emboli. Given Levaquin for the UTI. Started on a Lovenox bridge and Coumadin. Her INR yesterday was 3.97 while taking 5 mg a day of Coumadin. She seems to feel good now and is not complaining of pain. She is almost finshed with the levaquin. Good oral intake.    Review of Systems  Constitutional: Negative.   Respiratory: Negative.   Cardiovascular: Positive for leg swelling. Negative for chest pain and palpitations.  Gastrointestinal: Negative.        Objective:   Physical Exam  Constitutional:       Alert, in her wheelchair   Cardiovascular: Normal rate, regular rhythm, normal heart sounds and intact distal pulses.   Pulmonary/Chest: Effort normal and breath sounds normal.  Musculoskeletal:       1+edema with tenderness in the right calf. No cords palpable           Assessment & Plan:  She is recovering well. Stop the Levaquin. Adjust the Coumadin as directed. Recheck an INR in one week.

## 2011-07-30 NOTE — Discharge Summary (Signed)
[name], [name]             ACCOUNT NO.:  [id number]  MEDICAL RECORD NO.:  [id number]  LOCATION:  5153                         FACILITY:  [facility]  PHYSICIAN:  Mauro Kaufmann, MD         DATE OF BIRTH:  [birth year]  DATE OF ADMISSION:  07/16/2011 DATE OF DISCHARGE:  07/21/2011                              DISCHARGE SUMMARY   ADMISSION DIAGNOSES: 1. Acute right lower extremity deep vein thrombosis. 2. History of deep vein thrombosis and pulmonary embolism. 3. Chronic obstructive pulmonary disease, compensated. 4. Leukocytosis due to chronic steroid use. 5. Mild dementia. 6. Hypertension.  DISCHARGE DIAGNOSES: 1. Acute right lower extremity deep vein thrombosis. 2. Urinary tract infection, currently on Levaquin. 3. Dementia. 4. Chronic obstructive pulmonary disease. 5. Hypokalemia. 6. Dysphagia. 7. Questionable foreign object in the bowels.  TESTS PERFORMED DURING THE HOSPITAL STAY: 1. Chest x-ray on July 16, 2011, showed tortuous aorta, borderline     cardiomegaly without edema, in addition to plasty left     hemidiaphragm and the right middle lung. 2. Abdominal x-ray on July 20, 2011, showed no new foreign body     identified in the abdomen or pelvis.  No free intraabdominal air. 3. CT of the abdomen and pelvis on July 20, 2011, showed no     significant change since prior study.  No evidence of foreign body.  PERTINENT LABS IN THE HOSPITAL:  The patient's white count with diff was 04540, it has gone down to 8.6, hemoglobin 9.9, hematocrit 30.8, platelet count of 213.  INR is 1.77.  UA is negative, only shows positive yeast in the urine culture.  The urine culture report from the nursing home shows Enterococcus species and Enterobacter cloacae, both of which is sensitive to Levaquin, also Enterobacter cloacae was sensitive to Rocephin which was given in the hospital.  BRIEF HISTORY AND PHYSICAL:  This patient is an 77 year old female who with advanced dementia who  had left femoral neck fracture last month and she was treated with left hemiarthroplasty, subsequently discharged to nursing home.  The patient was not given any anticoagulation at the time of discharge and so she came back in 2 weeks and was treated for bacteremia which was thought to be a contaminant and was discharged back to nursing home on Avelox.  So this time when the patient came, she was found to have right lower extremity swelling.  The Dopplers were done for the lower extremity which showed DVT, so the patient was noted to have evidence of DVT, started on Lovenox and Coumadin.  BRIEF HOSPITAL COURSE: 1. DVT.  The patient was started on Lovenox and Coumadin and at this     time, the patient's INR is 1.77.  She will continue to be on     Lovenox till the INR becomes therapeutic.  At this time, the     patient's daughter wants to take her home and the patient was     discharged home.  The patient's daughter is CNA and wants to     provide 24-hour care at home.  [facility]     has been set up and they will check the  patient's PT/INR at home     and report to Dr. Clent Ridges.  The patient will be given the bridging     Lovenox dose for 10 days and instructed to stop the Lovenox once     the PT/INR becomes more than 2.0. 2. UTI.  The urine culture in the hospital was negative except for     positive for yeast, so she was started on Diflucan, this will be     continued for 5 days.  But the patient's urine culture report from     the nursing home came back on July 21, 2011, and shows     Enterobacter species and Enterobacter cloacae, both of which     sensitive to Levaquin.  So, the patient will be discharged home on     Levaquin for 10 more days to complete the course of antibiotics. 3. Dysphagia, we will do swallow evaluation in the hospital.  The     patient is on modified dysphagia 1 diet and she will be continued     on that. 4. Dementia is stable at this  time. 5. Question of foreign object in the abdomen.  The patient was found     to have foreign object which was blue color, 5-inch long tube which     was found in the patient's stool and which was given to the     patient's daughter.  The x-ray of the abdomen was done and also a     CAT scan was done which did not show any abnormality in the     abdomen, so we are not sure that this tube was already in the     commode before and the patient moved her bowels or that this tube     came from the patient's last bowel.  Anyway, the patient is     asymptomatic.  She did not have any perforation, no infection. 6. The patient will be discharged home and we have given all the     medications, and she will follow up with Dr. Clent Ridges as an outpatient.  MEDICATION AT DISCHARGE: 1. Lovenox 70 mg subcu q.12 hours for 10 days. 2. Diflucan 100 mg p.o. q.24 hours for 5 days. 3. Levaquin 250 mg p.o. daily for 10 days. 4. Metoprolol 25 mg p.o. b.i.d. 5. Warfarin 5 mg tablet daily. 6. Albuterol nebulization, 1 nebulization inhaled twice a day as     needed. 7. Optivar 1 drop in both eyes daily. 8. Aspirin 81 mg p.o. daily. 9. Budesonide nebulization, 1 nebulizer inhaled twice daily. 10.Dulcolax 1 suppository daily at bedtime. 11.Calcium carbonate 1250 mg 2 tablets. 12.Certa-Vite 1 tablet p.o. daily at bedtime. 13.Flonase 2 sprays in each nostril twice daily as needed. 14.Guaifenesin 20 mL by mouth every 8 hours as needed. 15.Hydroxyzine 25 mg 1 tablet p.o. every 4 hours as needed. 16.Ipratropium nebulization, 1 nebulizer inhaled every 12 hours as     needed. 17.Namenda 10 mg p.o. b.i.d. 18.Percocet 1 tablet p.o. every 6 hours as needed. 19.Prednisone 10 mg p.o. daily. 20.Protonix 40 mg p.o. daily at bedtime. 21.MiraLax 17 g p.o. daily. 22.Singulair 10 mg 1 tablet daily at bedtime. 23.Vitamin C 500 mg 1 tablet daily. 24.Vitamin D3 1000 units 2 tablets daily.  The patient's Lasix has been held at  this time because of the episodes of hypotension.  On the day of discharge, the patient's vitals are stable.  Blood pressure is 118/57, respirations 18, pulse 68, temperature is 98.1,  O2 sats 96% on room air.     Mauro Kaufmann, MD     GL/MEDQ  D:  07/21/2011  T:  07/22/2011  Job:  [id number]  cc:   Dr. Clent Ridges  Electronically Signed by Sibyl Parr LAMA  on 07/30/2011 07:49:40 AM

## 2011-07-31 ENCOUNTER — Telehealth: Payer: Self-pay | Admitting: Family Medicine

## 2011-07-31 DIAGNOSIS — I82409 Acute embolism and thrombosis of unspecified deep veins of unspecified lower extremity: Secondary | ICD-10-CM

## 2011-07-31 DIAGNOSIS — R269 Unspecified abnormalities of gait and mobility: Secondary | ICD-10-CM

## 2011-07-31 DIAGNOSIS — F0390 Unspecified dementia without behavioral disturbance: Secondary | ICD-10-CM

## 2011-07-31 DIAGNOSIS — J449 Chronic obstructive pulmonary disease, unspecified: Secondary | ICD-10-CM

## 2011-07-31 NOTE — Telephone Encounter (Signed)
[facility] wants to know if patient needs another coumadin check today?  Patient is scheduled to be seen at 11am.  Please call Christy at [phone].

## 2011-08-01 NOTE — Telephone Encounter (Signed)
We will check it next Tuesday

## 2011-08-05 NOTE — H&P (Signed)
[name], [name]             ACCOUNT NO.:  [id number]  MEDICAL RECORD NO.:  [id number]  LOCATION:  [facility]                         FACILITY:  [facility]  PHYSICIAN:  Eduard Clos, MDDATE OF BIRTH:  May 01, 1929  DATE OF ADMISSION:  07/02/2011 DATE OF DISCHARGE:                             HISTORY & PHYSICAL   PRIMARY CARE PHYSICIAN:  [name], MD  PULMONOLOGIST:  [name], MD, FCCP  ORTHOPEDICIAN:  Nadara Mustard, MD  CHIEF COMPLAINT:  Positive blood cultures.  HISTORY OF PRESENT ILLNESS:  An 77 year old female who has recently been in the hospital and had a left femoral neck fracture and had a left hip hemiarthroplasty, was discharged to rehab on July 13th.  At that time, the patient also was treated for healthcare-associated pneumonia and one of her blood cultures was positive for coagulase-negative staph.  In the rehab, the patient was doing fine, but last 2-3 days, the patient was complaining of some abdominal pain and repeat labs showed increased WBC around 15.3 and repeat blood cultures show gram-positive cocci in clusters.  The patient has been transferred to the hospital for further management.  As per the patient's daughter, according to history, the patient did not have any nausea, vomiting, was complaining of vague abdominal pain for last 2-3 days.  Occasionally, she also complained of chest pain, did not have any dysuria, discharge, or diarrhea.  The patient did not lose consciousness.  The patient's daughter noted that 3 days ago, the table next to mom's bed was broken, she was not sure if she had fallen.  The patient did not lose consciousness, did not have any headaches, or any focal deficit.  The wound where the patient had left hemiarthroplasty is well healed and there is no discharge from there.  The patient's daughter states she is more alert than what she is today.  The patient has been admitted for further workup.  Repeat labs in ER done  still showing leukocytosis.  The patient's fever is around 99.  PAST MEDICAL HISTORY: 1. History of bronchial asthma. 2. Advanced dementia. 3. Osteoporosis. 4. Chronic steroid use for asthma. 5. History of mild chronic kidney disease, stage 2. 6. Recent hospital-acquired pneumonia. 7. Recent blood cultures, one of them was positive for coagulase-     negative staph. 8. Recent left hip hemiarthroplasty.  MEDICATIONS:  The patient was recently discharged includes, 1. Aspirin 81 mg p.o. daily. 2. MiraLax. 3. Atrovent nebulizer. 4. Bisacodyl. 5. Lopressor 50 mg p.o. b.i.d. 6. Ensure. 7. Robitussin. 8. Albuterol nebulizer. 9. Fosamax, which was discontinued. 10.Vitamin C. 11.Optivar. 12.Pulmicort. 13.Calcium. 14.Vitamin D. 15.Flonase. 16.Hydroxyzine 25 mg p.o. q.4 p.r.n. 17.Namenda 10 mg p.o. b.i.d. 18.Singulair 10 mg daily. 19.Prednisone 10 mg daily. 20.Avelox.  ALLERGIES:  No known drug allergies.  FAMILY HISTORY:  Nothing contributory.  SOCIAL HISTORY:  The patient at this time was in rehab, but used to live with her daughter, is a full code.  Denies smoking cigarette, drinking alcohol, or using illegal drugs.  REVIEW OF SYSTEMS:  As per the history of present illness, nothing else significant.  PHYSICAL EXAMINATION:  GENERAL:  The patient examined at bedside not in acute distress. VITAL SIGNS:  Blood pressure 170/60, pulse 80 per minute, temperature 99.6, respiration 18 per minute, O2 sat 96%. HEENT:  Anicteric.  No pallor.  No facial asymmetry .  Tongue is midline.  No neck rigidity.  No discharge from ears, eyes, nose, or mouth. CHEST:  Bilateral air entry present.  No rhonchi, no crepitation. HEART:  S1, S2 heard. ABDOMEN:  Soft, nontender.  Bowel sounds heard. CNS:  The patient is alert, awake, and is at this time not following commands.  Moving upper and lower extremities. EXTREMITIES:  Peripheral pulses felt.  There is a small bruise in the leg on the  right foot area, on the medial aspect.  There are no acute ischemic changes, cyanosis, or clubbing.  LABORATORY DATA:  EKG has been ordered.  Chest x-ray shows possible left basilar atelectasis with persistent elevation of the left hemidiaphragm. CBC, WBC 15.9, hemoglobin 11.8, hematocrit 37.6, and platelets 199. Basic metabolic panel, sodium 138, potassium 3.5, chloride 103, carbon dioxide 25, glucose 165, BUN 15, creatinine 0.8, calcium 9.8, procalcitonin less than 0.1, lactic acid 2.3.  UA is negative for nitrites, leukocytes, squamous cells few, RBC 7-10, bacteria few, blood cultures and urine cultures are pending.  ASSESSMENT: 1. Bacteremia with blood cultures gram-positive cocci in clusters at     rehab. 2. Recent admission for left hip fracture, status post     hemiarthroplasty. 3. Recently treated for healthcare-associated pneumonia. 4. Advanced dementia. 5. History of bronchial asthma. 6. History of chronic steroid use. 7. History of osteoporosis.  PLAN: 1. At this time, we will admit the patient to telemetry. 2. For her persistent bacteremia, we are going to get repeat blood     culture x2.  We are also going to get urine cultures, after which     we will start the patient on empiric antibiotics as the patient's     blood culture showing persistent bacteremia.  We are going to get a     2-D echo to rule out any vegetation and the patient is not as alert     as she usually used to be as per the patient's daughter.  We will     get a CT head and also get a CT abdomen and pelvis as the patient     was complaining of some abdominal pain, which is nonspecific.     Along with that, we will also get LFTs, lipase, and also we will     cycle cardiac marker as the patient is occasionally complaining of     chest pain.  We will also get an EKG. 3. We need to verify home medication. 4. As the patient is on 10 mg prednisone daily, we are going to give     her stress-dose steroids  50 mg IV q.8 for 3 doses. 5. Further recommendation based on test order and clinical course.     Eduard Clos, MD     ANK/MEDQ  D:  07/02/2011  T:  07/02/2011  Job:  [id number]  cc:   [name], MD [address] [place] [address]  [name], MD, FCCP [address] [place] [address]  Nadara Mustard, MD Fax: [phone]  Electronically Signed by Midge Minium MD on 08/05/2011 09:03:33 AM

## 2011-08-07 ENCOUNTER — Telehealth: Payer: Self-pay | Admitting: Family Medicine

## 2011-08-07 NOTE — Telephone Encounter (Signed)
I got a call from [facility] and pt's PT was 45.4 and INR was 3.8. Pt started on 2.5 mg of Coumadin on 08/03/11. She did not take any on thur, fri, or sat. Also nurse Christly wanted to know if we could extend her orders to visit the pt. It is still hard for the pt to come in for the lab check. Please call back to [phone] Neysa Bonito).

## 2011-08-07 NOTE — Telephone Encounter (Signed)
Continue on  2.5 mg a day of Coumadin, and recheck an INR in one week. Extend the orders for Home Health to see her for 4 more weeks

## 2011-08-08 ENCOUNTER — Telehealth: Payer: Self-pay | Admitting: *Deleted

## 2011-08-08 NOTE — Telephone Encounter (Signed)
Pt needs a prescription for a hospital bed and wheelchair called to [facility].  [phone]

## 2011-08-08 NOTE — Telephone Encounter (Signed)
I will call and give verbal order and fax the order.

## 2011-08-08 NOTE — Telephone Encounter (Signed)
I spoke with Neysa Bonito at [facility] and gave below order.

## 2011-08-08 NOTE — Telephone Encounter (Signed)
This requires a written order, so I put one in your box

## 2011-08-14 ENCOUNTER — Telehealth: Payer: Self-pay | Admitting: Family Medicine

## 2011-08-14 NOTE — Telephone Encounter (Signed)
Excellent. Maintain current dose and recheck INR in 2 weeks

## 2011-08-14 NOTE — Telephone Encounter (Signed)
Calling in pt-inr results inr---2.8   Pt-33.9.

## 2011-08-14 NOTE — Telephone Encounter (Signed)
Left voice message with results.

## 2011-08-21 ENCOUNTER — Telehealth: Payer: Self-pay | Admitting: Family Medicine

## 2011-08-21 NOTE — Telephone Encounter (Signed)
[facility] called and said that pts PT was 34.9 and INR was 2.9. Req call back asap, to find out what needs to be done about pts coumedin. Pls call

## 2011-08-22 ENCOUNTER — Telehealth: Payer: Self-pay | Admitting: Family Medicine

## 2011-08-22 LAB — URINALYSIS, ROUTINE W REFLEX MICROSCOPIC
Bilirubin Urine: NEGATIVE
Ketones, ur: NEGATIVE
Nitrite: NEGATIVE
Protein, ur: NEGATIVE
Urobilinogen, UA: 1

## 2011-08-22 LAB — DIFFERENTIAL
Basophils Absolute: 0
Basophils Relative: 0
Eosinophils Absolute: 0
Monocytes Absolute: 0.5
Monocytes Relative: 7
Neutro Abs: 6

## 2011-08-22 LAB — BASIC METABOLIC PANEL
BUN: 10
BUN: 9
CO2: 26
CO2: 27
CO2: 27
Calcium: 8.2 — ABNORMAL LOW
Chloride: 104
Chloride: 106
Chloride: 96
Creatinine, Ser: 1.01
Creatinine, Ser: 1.07
Creatinine, Ser: 1.29 — ABNORMAL HIGH
GFR calc Af Amer: >60
GFR calc Af Amer: >60
Glucose, Bld: 134 — ABNORMAL HIGH
Glucose, Bld: 176 — ABNORMAL HIGH
Potassium: 3.7

## 2011-08-22 LAB — CBC
Hemoglobin: 13.1
MCHC: 33.3
MCV: 83.6
RBC: 4.7
RDW: 14.1

## 2011-08-22 LAB — CULTURE, BLOOD (ROUTINE X 2)

## 2011-08-22 LAB — HEMOGLOBIN A1C: Mean Plasma Glucose: 119

## 2011-08-22 NOTE — Telephone Encounter (Signed)
This is perfect. Stay on same Coumadin dose and recheck in 2 weeks (NOT one week)

## 2011-08-22 NOTE — Telephone Encounter (Signed)
done

## 2011-08-22 NOTE — Telephone Encounter (Signed)
I spoke with Neysa Bonito and gave message from Dr. Clent Ridges.

## 2011-08-26 LAB — CBC
HCT: 34.4 — ABNORMAL LOW
HCT: 39.8
MCHC: 32.8
MCV: 86
MCV: 86.1
Platelets: 149 — ABNORMAL LOW
Platelets: 154
Platelets: 156
Platelets: 179
RBC: 4.63
RDW: 15
RDW: 15.4
WBC: 11 — ABNORMAL HIGH
WBC: 9

## 2011-08-26 LAB — DIFFERENTIAL
Eosinophils Absolute: 0
Eosinophils Relative: 0
Lymphs Abs: 1.7
Monocytes Relative: 8

## 2011-08-26 LAB — URINALYSIS, ROUTINE W REFLEX MICROSCOPIC
Glucose, UA: NEGATIVE
Ketones, ur: NEGATIVE
Protein, ur: NEGATIVE

## 2011-08-26 LAB — COMPREHENSIVE METABOLIC PANEL
ALT: 22
Albumin: 2.5 — ABNORMAL LOW
Alkaline Phosphatase: 43
BUN: 11
Potassium: 3.2 — ABNORMAL LOW
Sodium: 140
Total Protein: 5.3 — ABNORMAL LOW

## 2011-08-26 LAB — PROTIME-INR
INR: 1
INR: 1
INR: 1.1
INR: 1.5
Prothrombin Time: 12.9
Prothrombin Time: 13.6
Prothrombin Time: 14.7
Prothrombin Time: 18 — ABNORMAL HIGH

## 2011-08-26 LAB — BASIC METABOLIC PANEL
BUN: 14
CO2: 28
CO2: 29
Chloride: 103
Chloride: 107
Glucose, Bld: 80
Potassium: 3.6
Potassium: 4.4
Sodium: 140

## 2011-08-26 LAB — RETICULOCYTES: Retic Count, Absolute: 62.4

## 2011-08-26 LAB — FERRITIN: Ferritin: 148 (ref 10–291)

## 2011-08-26 LAB — APTT: aPTT: 32

## 2011-08-26 LAB — FOLATE: Folate: 10.9

## 2011-08-26 LAB — IRON AND TIBC: UIBC: 152

## 2011-08-29 ENCOUNTER — Ambulatory Visit (INDEPENDENT_AMBULATORY_CARE_PROVIDER_SITE_OTHER): Payer: Medicare HMO | Admitting: Family Medicine

## 2011-08-29 ENCOUNTER — Encounter: Payer: Self-pay | Admitting: Family Medicine

## 2011-08-29 VITALS — BP 104/68 | HR 77 | Temp 98.8°F

## 2011-08-29 DIAGNOSIS — S8390XA Sprain of unspecified site of unspecified knee, initial encounter: Secondary | ICD-10-CM

## 2011-08-29 DIAGNOSIS — S93409A Sprain of unspecified ligament of unspecified ankle, initial encounter: Secondary | ICD-10-CM

## 2011-08-29 DIAGNOSIS — Z23 Encounter for immunization: Secondary | ICD-10-CM

## 2011-08-29 DIAGNOSIS — IMO0002 Reserved for concepts with insufficient information to code with codable children: Secondary | ICD-10-CM

## 2011-08-29 DIAGNOSIS — I10 Essential (primary) hypertension: Secondary | ICD-10-CM

## 2011-08-29 LAB — COMPREHENSIVE METABOLIC PANEL
AST: 20
Albumin: 3.2 — ABNORMAL LOW
Chloride: 102
Creatinine, Ser: 1.14
GFR calc Af Amer: 56 — ABNORMAL LOW
Potassium: 2.7 — CL
Total Bilirubin: 1.4 — ABNORMAL HIGH

## 2011-08-29 LAB — URINALYSIS, ROUTINE W REFLEX MICROSCOPIC
Glucose, UA: NEGATIVE
Protein, ur: NEGATIVE
Specific Gravity, Urine: 1.014
pH: 6.5

## 2011-08-29 LAB — CBC
MCV: 86.2
Platelets: 174
WBC: 12.1 — ABNORMAL HIGH

## 2011-08-29 LAB — DIFFERENTIAL
Basophils Absolute: 0
Eosinophils Relative: 1
Lymphocytes Relative: 13
Monocytes Absolute: 0.5

## 2011-08-29 LAB — OCCULT BLOOD X 1 CARD TO LAB, STOOL: Fecal Occult Bld: NEGATIVE

## 2011-08-29 LAB — URINE MICROSCOPIC-ADD ON

## 2011-08-29 NOTE — Progress Notes (Signed)
  Subjective:    Patient ID: [name], female    DOB: 07-Aug-1929, 77 y.o.   MRN: [id number]  HPI Here to check her right knee and right ankle after a fall at home several days ago. She has done well with her PT and is now walking a little with her walker. The knee and ankle are a bit swollen and are painful. Her BP is stable and is a little low, in fact.    Review of Systems  Constitutional: Negative.   Respiratory: Negative.   Cardiovascular: Negative.   Musculoskeletal: Positive for joint swelling and arthralgias.       Objective:   Physical Exam  Constitutional: She appears well-developed and well-nourished.       In her wheelchair  Cardiovascular: Normal rate, regular rhythm, normal heart sounds and intact distal pulses.   Pulmonary/Chest: Effort normal and breath sounds normal.  Musculoskeletal:       Right knee is mildly swollen and mildly tender in the joint spaces. Full ROM with no crepitus. The right ankle is slightly tender with full ROM and no swelling.           Assessment & Plan:  She has mild sprains to the knee and ankle. No treatment is needed. Use Neoprene support sleeves prn. Stop the BP meds, stop the diuretics.

## 2011-08-29 NOTE — Progress Notes (Signed)
Addended by: Aniceto Boss A on: 08/29/2011 04:08 PM   Modules accepted: Orders

## 2011-09-04 ENCOUNTER — Telehealth: Payer: Self-pay | Admitting: Family Medicine

## 2011-09-04 NOTE — Telephone Encounter (Signed)
[name] from [facility] called with lab results drawn on 09/04/11.( PT is 28.8 and INR is 2.4 )  Pt is on Coumadin 5 mg take 1/2 tablet every day. Today is the last scheduled home visit. Please call [name] and give Coumadin dose so she can document.

## 2011-09-05 NOTE — Telephone Encounter (Signed)
Stay on current Coumadin dose and recheck in 4 weeks

## 2011-09-05 NOTE — Telephone Encounter (Signed)
Left voice message for pt and spoke with Home [facility].

## 2011-09-13 NOTE — H&P (Signed)
[name], [name]             ACCOUNT NO.:  [id number]  MEDICAL RECORD NO.:  [id number]  LOCATION:  1513                         FACILITY:  [facility]  PHYSICIAN:  Lonia Blood, M.D.      DATE OF BIRTH:  Jul 03, 1929  DATE OF ADMISSION:  06/03/2011 DATE OF DISCHARGE:                             HISTORY & PHYSICAL   PRIMARY CARE PHYSICIAN:  [name], MD  ORTHOPEDIC PHYSICIAN:  Nadara Mustard, MD  PRESENTING COMPLAINT:  Fall and left hip pain.  HISTORY OF PRESENT ILLNESS:  The patient is an 77 year old female who apparently fell at home and sustained fracture of left femur.  Per daughter, daughter was outside and the patient was sitting in the chair, when she heard a noise, she came back and realized her mother tried to get up on her own and fell to the floor.  She then sustained pain in her left lower extremity, which also seemed to be rotated, so she brought her to the emergency room.  There was no loss of consciousness and it sounds like it was all completely mechanical in nature.  The patient has advanced dementia, but also has a lot of respiratory issues due to asthma.  She has a long-standing history of osteoporosis as well as hypokalemia.  She is on chronic prednisone and also has mild chronic edema.  Her past medical history is significant for, 1. Dementia. 2. Asthma. 3. Osteoporosis. 4. Chronic steroid use. 5. Mild chronic kidney disease, stage 2. 6. Chronic hypokalemia.  ALLERGIES:  No known drug allergies.  MEDICATIONS: 1. DuoNeb 4 times a day. 2. Fosamax 35 mg weekly. 3. Pulmicort inhaler twice a day. 4. Calcium carbonate 1250 mg daily. 5. Cholecalciferol 2000 international units daily. 6. Dextromethorphan. 7. Guaifenesin twice a day. 8. Flonase inhaler twice a day. 9. Lasix 40 mg daily. 10.Atarax 25 mg 4 times a day as needed. 11.Namenda 10 mg twice a day. 12.Singulair 10 mg daily. 13.Multivitamins 1 tablet daily. 14.Potassium chloride 20 mEq  twice a day. 15.Prednisone 10 mg daily. 16.Augmentin twice a day.  SOCIAL HISTORY:  The patient lives with her daughter in [place]. No tobacco, alcohol, or IV drug use.  She normally walks without a walker or any major support.  She has dementia and the daughter has been assisting her.  FAMILY HISTORY:  Noncontributory due to age.  REVIEW OF SYSTEMS:  All systems reviewed, currently are negative except per HPI.  The patient is just having pain in her left hip.  PHYSICAL EXAMINATION:  VITAL SIGNS:  Temperature 98.3, blood pressure 129/92 with a pulse 64, respiratory rate 14, sats 96% room air. GENERAL:  She is awake, alert, oriented.  She is communicating, does not seem to be in any acute distress. HEENT:  PERRL.  EOMI.  No significant pallor, no jaundice, no rhinorrhea. NECK:  Supple.  No visible JVD.  No lymphadenopathy. RESPIRATORY:  She has good air entry bilaterally with some mild expiratory wheezing. CARDIOVASCULAR:  She has S1, S2.  No audible murmur. ABDOMEN:  Soft, full, nontender with positive bowel sounds. EXTREMITIES:  Left lower extremity is medially rotated and shortened. Some sign of ecchymosis in the lateral aspect of her  hip joint, otherwise she seems stable. SKIN:  No rashes or ulcers. MUSCULOSKELETAL:  Again, left lower extremity medially rotated and shortened otherwise.  No other joint swelling, tenderness, or disfigurement. NEURO:  She is awake, responds to voice, although she is demented.  She seems alert.  LABORATORY DATA:  White count is 13.6, hemoglobin 14.7, her platelet count of 146.  Sodium is 139, potassium 3.2, chloride 101, CO2 of 29, glucose 92, BUN 17, creatinine 1.16, calcium 9.3.  Left hip x-ray shows left femoral neck fracture with varus angulation. Pelvic x-ray x2 also showed the varus angulation within the femur of neck fracture.  Chest x-ray showed cardiomegaly with left base atelectasis.  CT head without contrast showed atrophy and  small-vessel disease, no acute findings.  CT of the C-spine also showed no visible cervical spine fracture or traumatic subluxation.  ASSESSMENT:  This is an 77 year old female who sustained a fall and left femoral neck fracture.  Orthopedics has been consulted already Dr. [name] and she needs clearance prior to surgery.  She also has mild thrombocytopenia, acute renal failure probably from dehydration, dementia, asthma, osteoporosis, and hypokalemia.  She is also on chronic steroids.  PLAN: 1. Left femoral neck fracture.  The patient should be able to get this     surgically repaired.  Her medical problems are not overwhelming.     She has a long-standing history of asthma, but it seems stable.     She is not in any asthma exacerbation at the moment.  We will     continue tuning her up and hopefully she could get this surgically     repaired soon. 2. Status post fall, probably from her dementia.  She will need     extended PT, OT, and maybe short-term rehab after hospital stay. 3. Dementia.  Continue with her Namenda and fall precautions. 4. Asthma.  I will continue her home medications, empiric nebulizers,     and continue with her steroids. 5. Hypokalemia, probably from use of nebulizers at home and Lasix.  We     will replete her potassium. 6. Osteoporosis.  She takes Fosamax weekly and calcium.  We will     continue her medications in the hospital as necessary. 7. Acute renal failure.  This is mild, probably from dehydration.  We     will hydrate and follow renal function. 8. Thrombocytopenia, probably reactive.  We will follow her platelets     closely. 9. Chronic steroid use.  Her blood pressure is stable.  We need to     watch out.  If needed, we will give her stress dose steroids.     Lonia Blood, M.D.     Verlin Grills  D:  06/04/2011  T:  06/04/2011  Job:  [id number]  Electronically Signed by Lonia Blood M.D. on 09/13/2011 02:40:05 PM

## 2011-10-10 ENCOUNTER — Encounter: Payer: Self-pay | Admitting: Family Medicine

## 2011-10-10 ENCOUNTER — Ambulatory Visit (INDEPENDENT_AMBULATORY_CARE_PROVIDER_SITE_OTHER): Payer: Medicare HMO | Admitting: Family Medicine

## 2011-10-10 VITALS — BP 120/70 | HR 87 | Temp 98.2°F

## 2011-10-10 DIAGNOSIS — I82409 Acute embolism and thrombosis of unspecified deep veins of unspecified lower extremity: Secondary | ICD-10-CM

## 2011-10-10 DIAGNOSIS — I10 Essential (primary) hypertension: Secondary | ICD-10-CM

## 2011-10-10 DIAGNOSIS — F0390 Unspecified dementia without behavioral disturbance: Secondary | ICD-10-CM

## 2011-10-10 LAB — POCT INR: INR: 1.9

## 2011-10-10 MED ORDER — METOPROLOL SUCCINATE 12.5 MG HALF TABLET: 12.5000 mg | ORAL_TABLET | Freq: Two times a day (BID) | ORAL | Status: DC

## 2011-10-10 MED ORDER — WARFARIN SODIUM 5 MG PO TABS: ORAL_TABLET | ORAL | Status: DC

## 2011-10-10 NOTE — Progress Notes (Signed)
  Subjective:    Patient ID: [name], female    DOB: Dec 11, 1928, 77 y.o.   MRN: [id number]  HPI Here for recheck of her BP and her Coumadin. She has been on Coumadin since 07-16-11 for a DVT. Our plan is to stay on this for 6 months and then stop it. She had complained of HAs several days ago, and her BP those days went up to 160 systolic. She had been off Metoprolol for several months   Review of Systems  Constitutional: Negative.   Respiratory: Negative.   Cardiovascular: Negative.        Objective:   Physical Exam  Constitutional: She appears well-developed and well-nourished.  Cardiovascular: Regular rhythm, normal heart sounds and intact distal pulses.   Pulmonary/Chest: Effort normal and breath sounds normal.  Neurological: No cranial nerve deficit.          Assessment & Plan:  We will restart on Metoprolol but at a lower dose of 12.5 mg bid. Plan to stay on Coumadin until mid February 2013. Recheck INR in 2 weeks

## 2011-10-22 ENCOUNTER — Ambulatory Visit (INDEPENDENT_AMBULATORY_CARE_PROVIDER_SITE_OTHER): Payer: Medicare HMO | Admitting: Family Medicine

## 2011-10-22 DIAGNOSIS — I82409 Acute embolism and thrombosis of unspecified deep veins of unspecified lower extremity: Secondary | ICD-10-CM

## 2011-10-22 LAB — POCT INR: INR: 7.8

## 2011-10-22 NOTE — Patient Instructions (Signed)
Hold 5 days then take   Latest dosing instructions   Total Sun Mon Tue Wed Thu Fri Sat   35 5 mg 5 mg 5 mg 5 mg 5 mg 5 mg 5 mg    (5 mg1) (5 mg1) (5 mg1) (5 mg1) (5 mg1) (5 mg1) (5 mg1)

## 2011-10-29 ENCOUNTER — Emergency Department (HOSPITAL_COMMUNITY): Payer: Medicare HMO

## 2011-10-29 ENCOUNTER — Emergency Department (HOSPITAL_COMMUNITY)
Admission: EM | Admit: 2011-10-29 | Discharge: 2011-10-29 | Disposition: A | Discharge status: Home / Self Care | Payer: Medicare HMO | Attending: Emergency Medicine | Admitting: Emergency Medicine

## 2011-10-29 ENCOUNTER — Encounter (HOSPITAL_COMMUNITY): Payer: Self-pay | Admitting: Emergency Medicine

## 2011-10-29 DIAGNOSIS — R112 Nausea with vomiting, unspecified: Secondary | ICD-10-CM | POA: Insufficient documentation

## 2011-10-29 DIAGNOSIS — G319 Degenerative disease of nervous system, unspecified: Secondary | ICD-10-CM | POA: Insufficient documentation

## 2011-10-29 DIAGNOSIS — I10 Essential (primary) hypertension: Secondary | ICD-10-CM | POA: Insufficient documentation

## 2011-10-29 DIAGNOSIS — Z86718 Personal history of other venous thrombosis and embolism: Secondary | ICD-10-CM | POA: Insufficient documentation

## 2011-10-29 DIAGNOSIS — J45909 Unspecified asthma, uncomplicated: Secondary | ICD-10-CM | POA: Insufficient documentation

## 2011-10-29 DIAGNOSIS — Z79899 Other long term (current) drug therapy: Secondary | ICD-10-CM | POA: Insufficient documentation

## 2011-10-29 DIAGNOSIS — R51 Headache: Secondary | ICD-10-CM | POA: Insufficient documentation

## 2011-10-29 DIAGNOSIS — K219 Gastro-esophageal reflux disease without esophagitis: Secondary | ICD-10-CM | POA: Insufficient documentation

## 2011-10-29 DIAGNOSIS — Z7901 Long term (current) use of anticoagulants: Secondary | ICD-10-CM | POA: Insufficient documentation

## 2011-10-29 DIAGNOSIS — M810 Age-related osteoporosis without current pathological fracture: Secondary | ICD-10-CM | POA: Insufficient documentation

## 2011-10-29 LAB — URINALYSIS, ROUTINE W REFLEX MICROSCOPIC
Bilirubin Urine: NEGATIVE
Glucose, UA: NEGATIVE mg/dL
Ketones, ur: NEGATIVE mg/dL
pH: 7 (ref 5.0–8.0)

## 2011-10-29 LAB — BASIC METABOLIC PANEL
BUN: 20 mg/dL (ref 6–23)
CO2: 28 mEq/L (ref 19–32)
Chloride: 108 mEq/L (ref 96–112)
Creatinine, Ser: 0.97 mg/dL (ref 0.50–1.10)
Potassium: 3.7 mEq/L (ref 3.5–5.1)

## 2011-10-29 LAB — CBC
HCT: 44.1 % (ref 36.0–46.0)
Hemoglobin: 14 g/dL (ref 12.0–15.0)
MCHC: 31.7 g/dL (ref 30.0–36.0)
MCV: 85.6 fL (ref 78.0–100.0)
RDW: 15 % (ref 11.5–15.5)
WBC: 16 10*3/uL — ABNORMAL HIGH (ref 4.0–10.5)

## 2011-10-29 LAB — URINE MICROSCOPIC-ADD ON

## 2011-10-29 MED ORDER — ONDANSETRON 4 MG PO TBDP: 4.0000 mg | ORAL_TABLET | Freq: Four times a day (QID) | ORAL | Status: AC | PRN

## 2011-10-29 MED ORDER — SODIUM CHLORIDE 0.9 % IV BOLUS (SEPSIS)
1000.0000 mL | Freq: Once | INTRAVENOUS | Status: AC
  Administered 2011-10-29: 1000 mL via INTRAVENOUS

## 2011-10-29 MED ORDER — ONDANSETRON 4 MG PO TBDP: 4.0000 mg | ORAL_TABLET | Freq: Four times a day (QID) | ORAL | Status: DC | PRN

## 2011-10-29 MED ORDER — ONDANSETRON HCL 4 MG/2ML IJ SOLN
4.0000 mg | Freq: Once | INTRAMUSCULAR | Status: AC
  Administered 2011-10-29: 4 mg via INTRAVENOUS
  Filled 2011-10-29: qty 2

## 2011-10-29 NOTE — ED Notes (Signed)
Pt informed that once her bolus ends she will be able to go home.

## 2011-10-29 NOTE — ED Notes (Signed)
Per Pt and Family: Pt with elevated coumadin level last Wednesday at MD office (7?), fell at home one week ago and "hit butt" only no LOC, fine for one week; now today has a headache and n/v x4; pt is confused at baseline, sometimes answers questions about how she feels (baseline)

## 2011-10-29 NOTE — ED Provider Notes (Signed)
History     CSN: [id number] Arrival date & time: 10/29/2011  5:33 PM   First MD Initiated Contact with Patient 10/29/11 1747      Chief Complaint  Patient presents with  . Nausea  . Emesis    x 4 today  . Anticoagulation    coumadin level was 7 last wed.  has not taken any since thurs.  had a fall last wed.   . Fall    last week    (Consider location/radiation/quality/duration/timing/severity/associated sxs/prior treatment) HPI Comments: Patient history obtained from her daughter.  She states that she was found to have a INR level of 7 in the when leaving Dr. Dionne Bucy office last week.  The patient is on Coumadin.  His advice was for the patient to stop taking the Coumadin and recheck it on a followup appointment.  However on Wednesday last week the patient fell and tumbled out of her wheelchair.  The patient was never evaluated for this fall and has been having nausea, vomiting, and headaches for the last 4 days.  She is also complaining of left hip pain and right foot pain. Pt is altered at baseline and does not follow commands well. Unable to answer questions.  Patient is a 77 y.o. female presenting with vomiting and fall. The history is provided by a relative.  Emesis   Fall Associated symptoms include vomiting.    Past Medical History  Diagnosis Date  . Asthma   . Hypertension   . Osteoporosis   . GERD (gastroesophageal reflux disease)   . Dementia   . Pulmonary embolism     hx of 4/09, sees Dr. [name]   . Leg edema   . DVT (deep venous thrombosis)     Past Surgical History  Procedure Date  . Cholecystectomy   . Lithotripsy   . Right knee surgery     Family History  Problem Relation Age of Onset  . Arthritis Other   . Hypertension Other   . Cancer Other     lung    History  Substance Use Topics  . Smoking status: Never Smoker   . Smokeless tobacco: Never Used  . Alcohol Use: No    OB History    Grav Para Term Preterm Abortions TAB SAB Ect Mult Living                   Review of Systems  Unable to perform ROS Gastrointestinal: Positive for vomiting.    Allergies  Review of patient's allergies indicates no known allergies.  Home Medications   Current Outpatient Rx  Name Route Sig Dispense Refill  . ALBUTEROL SULFATE (2.5 MG/3ML) 0.083% IN NEBU Nebulization Take 2.5 mg by nebulization every 6 (six) hours as needed. 1 neb every  3 hours as needed     . VITAMIN C 500 MG PO TABS Oral Take 500 mg by mouth daily.      . ASPIRIN 81 MG PO TABS Oral Take 81 mg by mouth daily.      . AZELASTINE HCL 0.05 % OP SOLN Both Eyes Place 1 drop into both eyes 2 (two) times daily.      . BUDESONIDE 0.5 MG/2ML IN SUSP Nebulization Take 0.5 mg by nebulization 2 (two) times daily.      Marland Kitchen CALCIUM CARBONATE 1250 MG PO CHEW Oral Chew 1 tablet by mouth at bedtime.     Marland Kitchen VITAMIN D 2000 UNITS PO TABS Oral Take 2,000 Units by mouth daily.      Marland Kitchen  ESOMEPRAZOLE MAGNESIUM 40 MG PO CPDR Oral Take 40 mg by mouth at bedtime.     Marland Kitchen HYDROXYZINE HCL 25 MG PO TABS Oral Take 25 mg by mouth every 4 (four) hours as needed.      Marland Kitchen MEMANTINE HCL 10 MG PO TABS Oral Take 10 mg by mouth 2 (two) times daily.      Marland Kitchen METOPROLOL SUCCINATE 12.5 MG HALF TABLET Oral Take 0.5 tablets (12.5 mg total) by mouth 2 (two) times daily. 60 tablet 11  . MONTELUKAST SODIUM 10 MG PO TABS Oral Take 10 mg by mouth at bedtime.      . MULTIVITAMIN PO Oral Take by mouth at bedtime.      Marland Kitchen PREDNISONE 10 MG PO TABS Oral Take 10 mg by mouth daily. Or see bottom of medication calender for specific tapering instructions     . WARFARIN SODIUM 5 MG PO TABS  5 mg. Take 5 MG EVERY DAY     . DEXTROMETHORPHAN-GUAIFENESIN 30-600 MG PO TB12 Oral Take 1 tablet by mouth every 12 (twelve) hours. 1-2 tabs every 12 hours as needed     . ENOXAPARIN SODIUM 80 MG/0.8ML [place] SOLN Subcutaneous Inject into the skin. Take 70 mg     . FLUCONAZOLE 100 MG PO TABS Oral Take 100 mg by mouth daily.      Marland Kitchen FLUTICASONE PROPIONATE 50  MCG/ACT NA SUSP Nasal 2 sprays by Nasal route 2 (two) times daily as needed.      . OXYCODONE-ACETAMINOPHEN 5-325 MG PO TABS Oral Take 1 tablet by mouth every 6 (six) hours as needed.        BP 163/78  Pulse 59  Temp(Src) 97.6 F (36.4 C) (Oral)  Resp 18  SpO2 96%  Physical Exam  Nursing note and vitals reviewed. Constitutional: She appears well-developed and well-nourished. No distress.       Unable to perform neurological, sensory and motor testing due to pts altered mental status at baseline.   HENT:  Head: Normocephalic and atraumatic.  Eyes: Conjunctivae are normal. Pupils are equal, round, and reactive to light.  Neck: Normal range of motion. Neck supple.  Cardiovascular: Normal rate, regular rhythm and normal heart sounds.   Pulmonary/Chest: Effort normal and breath sounds normal. She has no wheezes.  Abdominal: Soft. She exhibits no distension. There is no tenderness.  Musculoskeletal:       Feet:  Neurological: She is alert.  Skin: Skin is warm and dry.    ED Course  Procedures (including critical care time)  Labs Reviewed  PROTIME-INR - Abnormal; Notable for the following:    Prothrombin Time 16.7 (*)    All other components within normal limits  BASIC METABOLIC PANEL - Abnormal; Notable for the following:    Sodium 146 (*)    Glucose, Bld 152 (*)    GFR calc non Af Amer 54 (*)    GFR calc Af Amer 62 (*)    All other components within normal limits  CBC - Abnormal; Notable for the following:    WBC 16.0 (*)    RBC 5.15 (*)    All other components within normal limits  APTT  URINALYSIS, ROUTINE W REFLEX MICROSCOPIC   Ct Head Wo Contrast  10/29/2011  *RADIOLOGY REPORT*  Clinical Data: Headache with vomiting.  History of fall 1 week ago. On Coumadin therapy.  CT HEAD WITHOUT CONTRAST  Technique:  Contiguous axial images were obtained from the base of the skull through the vertex without contrast.  Comparison: Head CT 07/03/2011.  Findings: Atrophy primarily  involving the frontal and temporal lobes is again demonstrated.  There is diffuse periventricular white matter low density.  No acute intracranial hemorrhage, mass lesion, brain edema or extra-axial fluid collection is demonstrated.  Ventricle size is stable.  There is no hydrocephalus.  Possible air fluid level is again noted in the left maxillary sinus.  There is mild ethmoid sinus mucosal thickening.  The left mastoid air cells are partially opacified.  There is no coalescence.  No acute osseous findings are identified.  IMPRESSION:  1.  No acute intracranial findings identified. 2.  Stable atrophy and periventricular white matter disease. 3.  Stable left maxillary and mastoid sinus partial opacification.  Original Report Authenticated By: Gerrianne Scale, M.D.    All results were discussed with the patients family.  There was no acute bleed found on head CT and no trauma injury discovered upon x-ray.  Patient's elevated white count was discussed as well.  The patient will be discharged with Zofran for her nausea and vomiting in the patient daughter states that she will followup with Dr. Clent Ridges this week in regards to her Coumadin and hospital visit.  MDM  Nausea Vomiting HA        [place], [place] 10/29/11 2036

## 2011-10-29 NOTE — ED Notes (Signed)
Patient given discharge instructions, information, prescriptions, and diet order. Patient states that they adequately understand discharge information given and to return to ED if symptoms return or worsen.     

## 2011-10-29 NOTE — ED Notes (Signed)
Pt states that stomach, head and arms hurting. Patient states the abdomen hurts when palpated. 1L bolus running at this time. Patient mucous membranes upon inspection. Patient not presenting any emesis since being in the ED.

## 2011-10-30 NOTE — ED Provider Notes (Signed)
Medical screening examination/treatment/procedure(s) were performed by non-physician practitioner and as supervising physician I was immediately available for consultation/collaboration.  Cyndra Numbers, MD 10/30/11 [phone]

## 2011-10-31 ENCOUNTER — Ambulatory Visit: Payer: Medicare HMO | Admitting: Family Medicine

## 2011-10-31 ENCOUNTER — Telehealth: Payer: Self-pay | Admitting: Family Medicine

## 2011-10-31 DIAGNOSIS — I82409 Acute embolism and thrombosis of unspecified deep veins of unspecified lower extremity: Secondary | ICD-10-CM

## 2011-10-31 LAB — POCT INR: INR: 1.9

## 2011-10-31 NOTE — Telephone Encounter (Signed)
Yes--next week would be fine

## 2011-10-31 NOTE — Patient Instructions (Signed)
  Latest dosing instructions   Total Sun Mon Tue Wed Thu Fri Sat   35 5 mg 5 mg 5 mg 5 mg 5 mg 5 mg 5 mg    (5 mg1) (5 mg1) (5 mg1) (5 mg1) (5 mg1) (5 mg1) (5 mg1)        

## 2011-10-31 NOTE — Telephone Encounter (Signed)
Pt is coming in today for protime. Pt daughter is aware no ov available for er fup nausea. Can I create 30 min slot next wk?

## 2011-10-31 NOTE — Telephone Encounter (Signed)
lmom at pt son #Zenaida Niece)

## 2011-11-03 ENCOUNTER — Emergency Department (HOSPITAL_COMMUNITY): Payer: Medicare (Managed Care)

## 2011-11-03 ENCOUNTER — Encounter (HOSPITAL_COMMUNITY): Payer: Self-pay

## 2011-11-03 ENCOUNTER — Inpatient Hospital Stay (HOSPITAL_COMMUNITY)
Admission: EM | Admit: 2011-11-03 | Discharge: 2011-11-05 | DRG: 689 | Disposition: A | Discharge status: Home / Self Care | Payer: Medicare (Managed Care) | Attending: Internal Medicine | Admitting: Internal Medicine

## 2011-11-03 DIAGNOSIS — Z7901 Long term (current) use of anticoagulants: Secondary | ICD-10-CM

## 2011-11-03 DIAGNOSIS — Z86711 Personal history of pulmonary embolism: Secondary | ICD-10-CM

## 2011-11-03 DIAGNOSIS — E870 Hyperosmolality and hypernatremia: Secondary | ICD-10-CM | POA: Diagnosis present

## 2011-11-03 DIAGNOSIS — Z79899 Other long term (current) drug therapy: Secondary | ICD-10-CM

## 2011-11-03 DIAGNOSIS — F0390 Unspecified dementia without behavioral disturbance: Secondary | ICD-10-CM | POA: Diagnosis present

## 2011-11-03 DIAGNOSIS — R4182 Altered mental status, unspecified: Secondary | ICD-10-CM | POA: Diagnosis present

## 2011-11-03 DIAGNOSIS — N390 Urinary tract infection, site not specified: Principal | ICD-10-CM | POA: Diagnosis present

## 2011-11-03 DIAGNOSIS — I609 Nontraumatic subarachnoid hemorrhage, unspecified: Secondary | ICD-10-CM | POA: Diagnosis present

## 2011-11-03 DIAGNOSIS — N179 Acute kidney failure, unspecified: Secondary | ICD-10-CM | POA: Diagnosis present

## 2011-11-03 DIAGNOSIS — J45909 Unspecified asthma, uncomplicated: Secondary | ICD-10-CM | POA: Diagnosis present

## 2011-11-03 DIAGNOSIS — M810 Age-related osteoporosis without current pathological fracture: Secondary | ICD-10-CM | POA: Diagnosis present

## 2011-11-03 DIAGNOSIS — F068 Other specified mental disorders due to known physiological condition: Secondary | ICD-10-CM | POA: Diagnosis present

## 2011-11-03 DIAGNOSIS — IMO0002 Reserved for concepts with insufficient information to code with codable children: Secondary | ICD-10-CM

## 2011-11-03 DIAGNOSIS — I10 Essential (primary) hypertension: Secondary | ICD-10-CM | POA: Insufficient documentation

## 2011-11-03 DIAGNOSIS — Z86718 Personal history of other venous thrombosis and embolism: Secondary | ICD-10-CM

## 2011-11-03 DIAGNOSIS — D72829 Elevated white blood cell count, unspecified: Secondary | ICD-10-CM | POA: Diagnosis present

## 2011-11-03 DIAGNOSIS — K219 Gastro-esophageal reflux disease without esophagitis: Secondary | ICD-10-CM | POA: Diagnosis present

## 2011-11-03 LAB — DIFFERENTIAL
Basophils Absolute: 0 10*3/uL (ref 0.0–0.1)
Eosinophils Relative: 2 % (ref 0–5)
Lymphocytes Relative: 35 % (ref 12–46)
Neutrophils Relative %: 53 % (ref 43–77)

## 2011-11-03 LAB — CBC
MCV: 85.9 fL (ref 78.0–100.0)
Platelets: 187 10*3/uL (ref 150–400)
RDW: 15.4 % (ref 11.5–15.5)
WBC: 11.9 10*3/uL — ABNORMAL HIGH (ref 4.0–10.5)

## 2011-11-03 LAB — URINE MICROSCOPIC-ADD ON

## 2011-11-03 LAB — BASIC METABOLIC PANEL
CO2: 28 mEq/L (ref 19–32)
Calcium: 9.5 mg/dL (ref 8.4–10.5)
GFR calc non Af Amer: 28 mL/min — ABNORMAL LOW (ref >90)
Potassium: 3.7 mEq/L (ref 3.5–5.1)
Sodium: 149 mEq/L — ABNORMAL HIGH (ref 135–145)

## 2011-11-03 LAB — URINALYSIS, ROUTINE W REFLEX MICROSCOPIC
Glucose, UA: NEGATIVE mg/dL
Protein, ur: NEGATIVE mg/dL
Specific Gravity, Urine: 1.023 (ref 1.005–1.030)
Urobilinogen, UA: 1 mg/dL (ref 0.0–1.0)

## 2011-11-03 MED ORDER — CEFTRIAXONE SODIUM 1 G IJ SOLR: 1.0000 g | Freq: Once | INTRAMUSCULAR | Status: DC

## 2011-11-03 MED ORDER — DEXTROSE 5 % IV SOLN
1.0000 g | INTRAVENOUS | Status: DC
  Administered 2011-11-04: 1 g via INTRAVENOUS
  Filled 2011-11-03 (×2): qty 10

## 2011-11-03 MED ORDER — SODIUM CHLORIDE 0.45 % IV SOLN
INTRAVENOUS | Status: DC
  Administered 2011-11-03 – 2011-11-05 (×3): via INTRAVENOUS

## 2011-11-03 MED ORDER — AZELASTINE HCL 0.05 % OP SOLN: 1.0000 [drp] | Freq: Two times a day (BID) | OPHTHALMIC | Status: DC

## 2011-11-03 MED ORDER — ONDANSETRON HCL 4 MG/2ML IJ SOLN: 4.0000 mg | Freq: Four times a day (QID) | INTRAMUSCULAR | Status: DC | PRN

## 2011-11-03 MED ORDER — OLOPATADINE HCL 0.1 % OP SOLN
1.0000 [drp] | Freq: Two times a day (BID) | OPHTHALMIC | Status: DC
  Administered 2011-11-04 – 2011-11-05 (×3): 1 [drp] via OPHTHALMIC
  Filled 2011-11-03: qty 5

## 2011-11-03 MED ORDER — CEFTRIAXONE SODIUM 1 G IJ SOLR
1.0000 g | Freq: Once | INTRAMUSCULAR | Status: AC
  Administered 2011-11-03: 1 g via INTRAVENOUS
  Filled 2011-11-03: qty 10

## 2011-11-03 MED ORDER — SODIUM CHLORIDE 0.9 % IV SOLN
INTRAVENOUS | Status: DC
  Administered 2011-11-03 (×2): via INTRAVENOUS

## 2011-11-03 MED ORDER — ONDANSETRON HCL 4 MG PO TABS: 4.0000 mg | ORAL_TABLET | Freq: Four times a day (QID) | ORAL | Status: DC | PRN

## 2011-11-03 NOTE — Progress Notes (Signed)
ANTIBIOTIC CONSULT NOTE - INITIAL  Pharmacy Consult for ceftriaxone Indication: empiric / r/o UTI  No Known Allergies  Patient Measurements:   Adjusted Body Weight:   Vital Signs: Temp: 97.8 F (36.6 C) (12/03 1452) Temp src: Oral (12/03 1452) BP: 110/67 mmHg (12/03 2100) Pulse Rate: 57  (12/03 2100) Intake/Output from previous day:   Intake/Output from this shift:    Labs:  [facility] 11/03/11 1531  WBC 11.9*  HGB 14.2  PLT 187  LABCREA --  CREATININE 1.68*   The CrCl is unknown because both a height and weight (above a minimum accepted value) are required for this calculation. No results found for this basename: VANCOTROUGH:2,VANCOPEAK:2,VANCORANDOM:2,GENTTROUGH:2,GENTPEAK:2,GENTRANDOM:2,TOBRATROUGH:2,TOBRAPEAK:2,TOBRARND:2,AMIKACINPEAK:2,AMIKACINTROU:2,AMIKACIN:2, in the last 72 hours   Microbiology: No results found for this or any previous visit (from the past 720 hour(s)).  Medical History: Past Medical History  Diagnosis Date  . Asthma   . Hypertension   . Osteoporosis   . GERD (gastroesophageal reflux disease)   . Dementia   . Pulmonary embolism     hx of 4/09, sees Dr. [name]   . Leg edema   . DVT (deep venous thrombosis)     Medications:  Scheduled:    . cefTRIAXone (ROCEPHIN)  IV  1 g Intravenous Once  . cefTRIAXone (ROCEPHIN)  IV  1 g Intravenous Q24H  . DISCONTD: azelastine  1 drop Both Eyes BID  . DISCONTD: cefTRIAXone  1 g Intramuscular Once   Infusions:    . sodium chloride 125 mL/hr at 11/03/11 1651   Assessment: 77 year old will be continued on ceftriaxone. Had one dose of 1g ceftriaxone earlier at ~1900.   Goal of Therapy:    Plan:  1) Ceftriaxone 1g iv q24h, next dose tom at 1900. Will sign off  Zhoe Catania, Tsz-Yin 11/03/2011,9:57 PM

## 2011-11-03 NOTE — H&P (Signed)
INTERNAL MEDICINEMargaret L [name] is an [name]. female.    Chief Complaint: altered mental status   HPI: [name] woman with PMH significant for dementia, HTN, GERD, PE in 2009, DVT of RLE in 08/12 comes to the ER presents to the ER with altered mental status.  History is obtained from the chart as patient was in altered mental status, not following commands and  barely arousable even to painful stimuli.  Her daughter brought her here for evaluation because she is falling asleep more than usual and more rapidly than usual. Her daughter noted that she had a dry pull up on the morning of her admission, which is unusual for her. Her daughter also noted that she only had a very small bowel movement today, which is also unusual. She has not had a cough, fever, vomiting, or diarrhea.  In the ER, as well, the patient had further decline throughout her course of stay, she would response intermittently to begin with but then she  was barely arousable to painful stimuli when they got more concerned . Got a CT head that showed small subarachnoid hemorrhage.        Past Medical History  Diagnosis Date  . Asthma   . Hypertension   . Osteoporosis   . GERD (gastroesophageal reflux disease)   . Dementia   . Pulmonary embolism     hx of 4/09, sees Dr. [name]   . Leg edema   . DVT (deep venous thrombosis)     Past Surgical History  Procedure Date  . Cholecystectomy   . Lithotripsy   . Right knee surgery     Family History  Problem Relation Age of Onset  . Arthritis Other   . Hypertension Other   . Cancer Other     lung   Social History:  reports that she has never smoked. She has never used smokeless tobacco. She reports that she does not drink alcohol or use illicit drugs.  Allergies: No Known Allergies  Medications Prior to Admission  Medication Dose Route Frequency Provider Last Rate Last Dose  . 0.9 %  sodium chloride infusion   Intravenous Continuous Nicholes Stairs, MD 125  mL/hr at 11/03/11 1651    . cefTRIAXone (ROCEPHIN) 1 g in dextrose 5 % 50 mL IVPB  1 g Intravenous Once Grant Fontana, PA   1 g at 11/03/11 1949  . DISCONTD: cefTRIAXone (ROCEPHIN) injection 1 g  1 g Intramuscular Once Grant Fontana, [place]       Medications Prior to Admission  Medication Sig Dispense Refill  . albuterol (PROVENTIL) (2.5 MG/3ML) 0.083% nebulizer solution Take 2.5 mg by nebulization every 6 (six) hours as needed. For shortness of breath      . Ascorbic Acid (VITAMIN C) 500 MG tablet Take 500 mg by mouth daily.        Marland Kitchen azelastine (OPTIVAR) 0.05 % ophthalmic solution Place 1 drop into both eyes 2 (two) times daily.        . budesonide (PULMICORT) 0.5 MG/2ML nebulizer solution Take 0.5 mg by nebulization 2 (two) times daily as needed. wheezing      . Cholecalciferol (VITAMIN D) 2000 UNITS tablet Take 2,000 Units by mouth daily.        Marland Kitchen esomeprazole (NEXIUM) 40 MG capsule Take 40 mg by mouth at bedtime.       . fluticasone (FLONASE) 50 MCG/ACT nasal spray Place 2 sprays into the nose 2 (two) times daily as needed. For congestion and  runny nose      . hydrOXYzine (ATARAX) 25 MG tablet Take 25 mg by mouth every 4 (four) hours as needed. For itch      . memantine (NAMENDA) 10 MG tablet Take 10 mg by mouth 2 (two) times daily.        . metoprolol succinate (TOPROL-XL) 12.5 mg TB24 Take 0.5 tablets (12.5 mg total) by mouth 2 (two) times daily.  60 tablet  11  . montelukast (SINGULAIR) 10 MG tablet Take 10 mg by mouth at bedtime.        Marland Kitchen oxyCODONE-acetaminophen (PERCOCET) 5-325 MG per tablet Take 1 tablet by mouth every 6 (six) hours as needed. For pain      . predniSONE (DELTASONE) 10 MG tablet Take 10 mg by mouth daily after breakfast. Or see bottom of medication calender for specific tapering instructions      . warfarin (COUMADIN) 5 MG tablet Take 5 mg by mouth daily.       Marland Kitchen dextromethorphan-guaiFENesin (MUCINEX DM) 30-600 MG per 12 hr tablet Take 1 tablet by mouth every 12  (twelve) hours as needed. For Cough and wheeze      . ondansetron (ZOFRAN ODT) 4 MG disintegrating tablet Take 1 tablet (4 mg total) by mouth every 6 (six) hours as needed for nausea.  6 tablet  0    Results for orders placed during the hospital encounter of 11/03/11 (from the past 48 hour(s))  CBC     Status: Abnormal   Collection Time   11/03/11  3:31 PM      Component Value Range Comment   WBC 11.9 (*) 4.0 - 10.5 (K/uL)    RBC 5.24 (*) 3.87 - 5.11 (MIL/uL)    Hemoglobin 14.2  12.0 - 15.0 (g/dL)    HCT 09.8  11.9 - 14.7 (%)    MCV 85.9  78.0 - 100.0 (fL)    MCH 27.1  26.0 - 34.0 (pg)    MCHC 31.6  30.0 - 36.0 (g/dL)    RDW 82.9  56.2 - 13.0 (%)    Platelets 187  150 - 400 (K/uL)   DIFFERENTIAL     Status: Abnormal   Collection Time   11/03/11  3:31 PM      Component Value Range Comment   Neutrophils Relative 53  43 - 77 (%)    Neutro Abs 6.3  1.7 - 7.7 (K/uL)    Lymphocytes Relative 35  12 - 46 (%)    Lymphs Abs 4.2 (*) 0.7 - 4.0 (K/uL)    Monocytes Relative 10  3 - 12 (%)    Monocytes Absolute 1.2 (*) 0.1 - 1.0 (K/uL)    Eosinophils Relative 2  0 - 5 (%)    Eosinophils Absolute 0.3  0.0 - 0.7 (K/uL)    Basophils Relative 0  0 - 1 (%)    Basophils Absolute 0.0  0.0 - 0.1 (K/uL)   BASIC METABOLIC PANEL     Status: Abnormal   Collection Time   11/03/11  3:31 PM      Component Value Range Comment   Sodium 149 (*) 135 - 145 (mEq/L)    Potassium 3.7  3.5 - 5.1 (mEq/L)    Chloride 112  96 - 112 (mEq/L)    CO2 28  19 - 32 (mEq/L)    Glucose, Bld 96  70 - 99 (mg/dL)    BUN 35 (*) 6 - 23 (mg/dL)    Creatinine, Ser 8.65 (*) 0.50 -  1.10 (mg/dL)    Calcium 9.5  8.4 - 10.5 (mg/dL)    GFR calc non Af Amer 28 (*) >90 (mL/min)    GFR calc Af Amer 32 (*) >90 (mL/min)   URINALYSIS, ROUTINE W REFLEX MICROSCOPIC     Status: Abnormal   Collection Time   11/03/11  5:46 PM      Component Value Range Comment   Color, Urine AMBER (*) YELLOW  BIOCHEMICALS MAY BE AFFECTED BY COLOR   APPearance  TURBID (*) CLEAR     Specific Gravity, Urine 1.023  1.005 - 1.030     pH 6.0  5.0 - 8.0     Glucose, UA NEGATIVE  NEGATIVE (mg/dL)    Hgb urine dipstick MODERATE (*) NEGATIVE     Bilirubin Urine SMALL (*) NEGATIVE     Ketones, ur 15 (*) NEGATIVE (mg/dL)    Protein, ur NEGATIVE  NEGATIVE (mg/dL)    Urobilinogen, UA 1.0  0.0 - 1.0 (mg/dL)    Nitrite NEGATIVE  NEGATIVE     Leukocytes, UA LARGE (*) NEGATIVE    URINE MICROSCOPIC-ADD ON     Status: Abnormal   Collection Time   11/03/11  5:46 PM      Component Value Range Comment   Squamous Epithelial / LPF FEW (*) RARE     WBC, UA 11-20  <3 (WBC/hpf)    RBC / HPF 3-6  <3 (RBC/hpf)    Bacteria, UA MANY (*) RARE     Urine-Other AMORPHOUS URATES/PHOSPHATES      Ct Head Wo Contrast  11/03/2011  *RADIOLOGY REPORT*  Clinical Data: Fatigue, sudden onset altered mental status  CT HEAD WITHOUT CONTRAST  Technique:  Contiguous axial images were obtained from the base of the skull through the vertex without contrast.  Comparison: 10/29/2011  Findings: Generalized atrophy. Stable prominence of the ventricular system. No midline shift or mass effect. Small vessel chronic ischemic changes of deep cerebral white matter. New small focus focus of high attenuation acute subarachnoid hemorrhage in a sulcus in the right frontal region. No evidence of acute infarction or mass. Minimal mucosal thickening in a few ethmoid air cells. Bones diffusely demineralized.  IMPRESSION: Atrophy with small vessel chronic ischemic changes of deep cerebral white matter. New small focus of acute subarachnoid hemorrhage in a right frontal sulcus.  Critical Value/emergent results were called by telephone at the time of interpretation on 11/03/2011  at 1842 hrs  to  Grant Fontana, who verbally acknowledged these results.  Original Report Authenticated By: Lollie Marrow, M.D.   Dg Abd Acute W/chest  11/03/2011  *RADIOLOGY REPORT*  Clinical Data: Constipation for 1 week.  ACUTE ABDOMEN  SERIES (ABDOMEN 2 VIEW & CHEST 1 VIEW)  Comparison: 07/20/2011 CT of the abdomen.  Findings: The bowel gas pattern is normal.  There is retained stool noted throughout the colon.  There is no evidence for free peritoneal air.  There is sclerotic density associated the right anterior sixth through eighth ribs most consistent with probable healing lower right anterior rib fractures.  There are surgical clips within the right upper quadrant from a previous cholecystectomy.  There is a left hip prosthesis present.  The upright chest demonstrates mild cardiomegaly.  There are no infiltrates or edematous changes.  There is no evidence for pneumothorax.  IMPRESSION:  1.  Moderate retained colonic stool. 2.  No evidence for bowel obstruction or free peritoneal air. 3.  Probable healing anterior right sixth through eighth rib fractures with callous formation.  Original Report Authenticated By: Rolla Plate, M.D.    ROS: All systems negative except those mentioned in HPI.  Blood pressure 110/67, pulse 57, temperature 97.8 F (36.6 C), temperature source Oral, resp. rate 17, SpO2 97.00%. Physical Exam Constitutional: Vital signs reviewed. Patient grimaced to deep sternal rub, barely arousable, could not follow commands. Head: Normocephalic and atraumatic Ear: TM normal bilaterally Mouth: no erythema or exudates, MMM Eyes: pin point pupils, direct and indirect  light reflex were present,conjunctivae normal, No scleral icterus or pallor..  Neck: Supple, Trachea midline normal ROM, No JVD, mass, thyromegaly, or carotid bruit present.  Cardiovascular: RRR, S1 normal, S2 normal, no MRG, pulses symmetric and intact bilaterally Pulmonary/Chest: CTAB, no wheezes, rales, or rhonchi Abdominal: Soft. Non-tender, non-distended, bowel sounds are normal, no masses, organomegaly, or guarding present.  Musculoskeletal: No joint deformities, erythema, or stiffness, ROM full and no nontender Hematology: no cervical,  inginal, or axillary adenopathy.  Neurological: could not be obtained as she was unarousable. Extremities: trace pedal edema. Skin: Warm, dry and intact. No rash, cyanosis, or clubbing.   Basic Metabolic Panel: Recent Labs  [facility] 11/03/11 1531   NA 149*   K 3.7   CL 112   CO2 28   GLUCOSE 96   BUN 35*   CREATININE 1.68*   CALCIUM 9.5   MG --   PHOS --   Liver Function Tests: No results found for this basename: AST:2,ALT:2,ALKPHOS:2,BILITOT:2,PROT:2,ALBUMIN:2 in the last 72 hours No results found for this basename: LIPASE:2,AMYLASE:2 in the last 72 hours CBC: Recent Labs  [facility] 11/03/11 1531   WBC 11.9*   NEUTROABS 6.3   HGB 14.2   HCT 45.0   MCV 85.9   PLT 187    Ref. Range 11/03/2011 17:46  Color, Urine Latest Range: YELLOW  AMBER (A)  APPearance Latest Range: CLEAR  TURBID (A)  Specific Gravity, Urine Latest Range: 1.005-1.030  1.023  pH Latest Range: 5.0-8.0  6.0  Glucose, UA Latest Range: NEGATIVE mg/dL NEGATIVE  Bilirubin Urine Latest Range: NEGATIVE  SMALL (A)  Ketones, ur Latest Range: NEGATIVE mg/dL 15 (A)  Protein Latest Range: NEGATIVE mg/dL NEGATIVE  Urobilinogen, UA Latest Range: 0.0-1.0 mg/dL 1.0  Nitrite Latest Range: NEGATIVE  NEGATIVE  Leukocytes, UA Latest Range: NEGATIVE  LARGE (A)  Urine-Other No range found AMORPHOUS URATES/PHOSPHATES  WBC, UA Latest Range: <3 WBC/hpf 11-20  RBC / HPF Latest Range: <3 RBC/hpf 3-6  Squamous Epithelial / LPF Latest Range: RARE  FEW (A)  Bacteria, UA Latest Range: RARE  MANY (A)  Hgb urine dipstick Latest Range: NEGATIVE  MODERATE (A)          Assessment/Plan 1. Altered mental status:  Likely 2/2 UTI and subarachnoid hemorrhage in the setting of chronic coumadin for DVT and PE. This can be further complicated by electrolyte disturbances( hypernatremia) in the setting of dehydration. Of note patient as underlying dementia at baseline and is usually not very interactive.  Plan: Admit to telemetry bed.           Hold coumadin and ASA in the setting of subarachnoid hemorrhage and check her INR.          Neurochecks q4.          Will not consult Neurosurgery as the patient is DNR/DNI and the family agrees with it.          Treat UTI with ceftriaxone          Hydrate with IV fluids.  Hold home antihypertensives and memantine.          Check CBC q8.          Would plan a family meeting in the AM to discuss further course of action.  2. Hypernatremia: etiology unclear but could be related to  dehydration in the setting of decreased intake and insensible losses.    Plan: Hydrate with 1/2 NS at 125 cc/hr.             Recheck BMET now and in AM.             Check urine sodium and osmolarity for further work up.  3. Acute renal failure: likely pre- renal secondary to dehydration.   Plan; Hydrate with IVF            F/U on BMET.  4. Hypertension: BP is running soft.     Plan: Hold home antihypertensive meds.  5. Leucocytosis; likely secondary to UTI versus stress de margination.        Plan: Treat UTI with ceftriaxone.              Continue to monitor.  6. DVT: SCD's      Lorma Heater 11/03/2011, 9:50 PM  Update: INR- 3.94>> Will give patient 10 units of Vitamin K and FFP in the setting of subarachnoid hemorrhage.

## 2011-11-03 NOTE — ED Notes (Signed)
Primary Doctor arrived while patient in CT spoke with EDP currently at bedside speaking with family member and patient arrived from CT.

## 2011-11-03 NOTE — ED Notes (Signed)
Pt has hx of dementia. Assessment of pt difficult. Pt alert and responsive. Disoriented to place, time. Daughter reporting today pt became difficult to arouse. Daughter reports pt has been constipated over past few days, pt given an Enema last night.

## 2011-11-03 NOTE — ED Provider Notes (Signed)
History     CSN: [id number] Arrival date & time: 11/03/2011 11:46 AM   First MD Initiated Contact with Patient 11/03/11 1508      Chief Complaint  Patient presents with  . Fatigue    drowsy    (Consider location/radiation/quality/duration/timing/severity/associated sxs/prior treatment) The history is provided by a relative. The history is limited by the condition of the patient.   The patient is an 77 year old, female, with profound dementia.  She lives with her daughter.  Her daughter brought her here for evaluation because she is falling asleep more than usual and more rapidly than usual.  Her daughter noted that she had a dry.  Pull up.  This morning, which is unusual for her..  Her daughter also noted that she only had a very small bowel movement today, which is also unusual.  She has not had a cough, fever, vomiting, or diarrhea.  Past Medical History  Diagnosis Date  . Asthma   . Hypertension   . Osteoporosis   . GERD (gastroesophageal reflux disease)   . Dementia   . Pulmonary embolism     hx of 4/09, sees Dr. [name]   . Leg edema   . DVT (deep venous thrombosis)     Past Surgical History  Procedure Date  . Cholecystectomy   . Lithotripsy   . Right knee surgery     Family History  Problem Relation Age of Onset  . Arthritis Other   . Hypertension Other   . Cancer Other     lung    History  Substance Use Topics  . Smoking status: Never Smoker   . Smokeless tobacco: Never Used  . Alcohol Use: No    OB History    Grav Para Term Preterm Abortions TAB SAB Ect Mult Living                  Review of Systems  Unable to perform ROS   Allergies  Review of patient's allergies indicates no known allergies.  Home Medications   Current Outpatient Rx  Name Route Sig Dispense Refill  . ALBUTEROL SULFATE (2.5 MG/3ML) 0.083% IN NEBU Nebulization Take 2.5 mg by nebulization every 6 (six) hours as needed. For shortness of breath    . VITAMIN C 500 MG PO TABS  Oral Take 500 mg by mouth daily.      . ASPIRIN 81 MG PO CHEW Oral Chew 81 mg by mouth daily.      . AZELASTINE HCL 0.05 % OP SOLN Both Eyes Place 1 drop into both eyes 2 (two) times daily.      . BUDESONIDE 0.5 MG/2ML IN SUSP Nebulization Take 0.5 mg by nebulization 2 (two) times daily as needed. wheezing    . CALCIUM CARBONATE ANTACID 500 MG PO CHEW Oral Chew 2 tablets by mouth daily.      Marland Kitchen VITAMIN D 2000 UNITS PO TABS Oral Take 2,000 Units by mouth daily.      Marland Kitchen ESOMEPRAZOLE MAGNESIUM 40 MG PO CPDR Oral Take 40 mg by mouth at bedtime.     Marland Kitchen FLUTICASONE PROPIONATE 50 MCG/ACT NA SUSP Nasal Place 2 sprays into the nose 2 (two) times daily as needed. For congestion and runny nose    . HYDROXYZINE HCL 25 MG PO TABS Oral Take 25 mg by mouth every 4 (four) hours as needed. For itch    . MEMANTINE HCL 10 MG PO TABS Oral Take 10 mg by mouth 2 (two) times daily.      Marland Kitchen  METOPROLOL SUCCINATE 12.5 MG HALF TABLET Oral Take 0.5 tablets (12.5 mg total) by mouth 2 (two) times daily. 60 tablet 11  . MONTELUKAST SODIUM 10 MG PO TABS Oral Take 10 mg by mouth at bedtime.      Carma Leaven M PLUS PO TABS Oral Take 1 tablet by mouth daily.      . OXYCODONE-ACETAMINOPHEN 5-325 MG PO TABS Oral Take 1 tablet by mouth every 6 (six) hours as needed. For pain    . PREDNISONE 10 MG PO TABS Oral Take 10 mg by mouth daily after breakfast. Or see bottom of medication calender for specific tapering instructions    . WARFARIN SODIUM 5 MG PO TABS Oral Take 5 mg by mouth daily.     Marland Kitchen DM-GUAIFENESIN ER 30-600 MG PO TB12 Oral Take 1 tablet by mouth every 12 (twelve) hours as needed. For Cough and wheeze    . ONDANSETRON 4 MG PO TBDP Oral Take 1 tablet (4 mg total) by mouth every 6 (six) hours as needed for nausea. 6 tablet 0    BP 132/73  Pulse 72  Temp(Src) 97.8 F (36.6 C) (Oral)  Resp 16  SpO2 96%  Physical Exam  Constitutional:       Obese in no distress  HENT:  Head: Normocephalic and atraumatic.  Neck: Normal range of  motion. Neck supple.  Cardiovascular: Normal rate and regular rhythm.   Pulmonary/Chest: Effort normal and breath sounds normal.  Abdominal: Soft. There is tenderness.       Diffuse mild tenderness.  She seems to wake up when I palpate her abdomen  Neurological:       Somnolent  Skin: Skin is warm and dry.  Psychiatric:       Unable to assess due to profound dementia    ED Course  Procedures (including critical care time) Profoundly demented female, brought to the emergency department for evaluation of behavioral change with decreased urine output and bowel movements.  She seems to have abdominal tenderness to palpation.  We will perform blood tests, urinalysis, and acute abdominal series for assessment.   Labs Reviewed  CBC  DIFFERENTIAL  BASIC METABOLIC PANEL  URINALYSIS, ROUTINE W REFLEX MICROSCOPIC   No results found.   No diagnosis found.  I spoke with the PA in the CDU.  She will check the test results reevaluate the patient and make the final disposition.  Based upon the patient's condition and test results.  MDM  Dementia Constipation        Nicholes Stairs, MD 11/03/11 [phone]

## 2011-11-03 NOTE — ED Notes (Signed)
MD at bedside. 

## 2011-11-03 NOTE — ED Notes (Signed)
Spoke with CT stated could not see the order in computer reordered now sees the order will send for patient next.

## 2011-11-03 NOTE — ED Notes (Signed)
Patient from home, daughter noticed patient was more difficult to arouse, history of same in past.  CBG 98.  EMS reports patient has been awake since they moved her to their stretcher. Patient has a history of dementia/Alzheimers.  Patient is at baseline at this time.

## 2011-11-03 NOTE — ED Notes (Signed)
Patient is resting comfortably. Rn introduced self

## 2011-11-03 NOTE — ED Notes (Signed)
In and out cath completed using sterile technique. Assisted by Lenon Curt, RN. Pt tolerated well

## 2011-11-03 NOTE — ED Notes (Signed)
Unable to wake patient.  Pt responding with grimace to painful stimuli but not able to wake.  Not following basic commands.  Pt stayed asleep during in and out cath and only flinched once.  Daughter reports that this is the behavior that made her bring mother in today.  Spoke with MD and ordered CT of head

## 2011-11-04 DIAGNOSIS — N390 Urinary tract infection, site not specified: Principal | ICD-10-CM

## 2011-11-04 LAB — CBC
HCT: 37 % (ref 36.0–46.0)
HCT: 38.8 % (ref 36.0–46.0)
Hemoglobin: 13.5 g/dL (ref 12.0–15.0)
Hemoglobin: 11.7 g/dL — ABNORMAL LOW (ref 12.0–15.0)
Hemoglobin: 12.6 g/dL (ref 12.0–15.0)
MCH: 27.3 pg (ref 26.0–34.0)
MCV: 86.2 fL (ref 78.0–100.0)
MCV: 84.9 fL (ref 78.0–100.0)
Platelets: 179 10*3/uL (ref 150–400)
RBC: 4.95 MIL/uL (ref 3.87–5.11)
RBC: 4.57 MIL/uL (ref 3.87–5.11)
RDW: 15.4 % (ref 11.5–15.5)
RDW: 14.9 % (ref 11.5–15.5)
WBC: 10.5 10*3/uL (ref 4.0–10.5)
WBC: 9.2 10*3/uL (ref 4.0–10.5)
WBC: 7.8 10*3/uL (ref 4.0–10.5)

## 2011-11-04 LAB — BASIC METABOLIC PANEL
CO2: 25 mEq/L (ref 19–32)
Chloride: 116 mEq/L — ABNORMAL HIGH (ref 96–112)
Creatinine, Ser: 1.31 mg/dL — ABNORMAL HIGH (ref 0.50–1.10)
GFR calc Af Amer: 43 mL/min — ABNORMAL LOW (ref >90)
Potassium: 3.6 mEq/L (ref 3.5–5.1)
Sodium: 151 mEq/L — ABNORMAL HIGH (ref 135–145)

## 2011-11-04 LAB — PROTIME-INR
INR: 3.94 — ABNORMAL HIGH (ref 0.00–1.49)
INR: 1.81 — ABNORMAL HIGH (ref 0.00–1.49)
Prothrombin Time: 21.3 seconds — ABNORMAL HIGH (ref 11.6–15.2)

## 2011-11-04 LAB — ABO/RH: ABO/RH(D): A POS

## 2011-11-04 LAB — TYPE AND SCREEN
ABO/RH(D): A POS
Antibody Screen: NEGATIVE

## 2011-11-04 LAB — SODIUM, URINE, RANDOM: Sodium, Ur: 42 mEq/L

## 2011-11-04 MED ORDER — DOCUSATE SODIUM 100 MG PO CAPS
100.0000 mg | ORAL_CAPSULE | Freq: Two times a day (BID) | ORAL | Status: DC
  Administered 2011-11-04 – 2011-11-05 (×2): 100 mg via ORAL
  Filled 2011-11-04 (×2): qty 1

## 2011-11-04 MED ORDER — BISACODYL 10 MG RE SUPP: 10.0000 mg | Freq: Every day | RECTAL | Status: DC | PRN

## 2011-11-04 MED ORDER — METHYLPREDNISOLONE SODIUM SUCC 40 MG IJ SOLR
10.0000 mg | Freq: Every day | INTRAMUSCULAR | Status: DC
  Administered 2011-11-04 – 2011-11-05 (×2): 10 mg via INTRAVENOUS
  Filled 2011-11-04 (×2): qty 0.25

## 2011-11-04 MED ORDER — VITAMIN K1 10 MG/ML IJ SOLN
10.0000 mg | Freq: Once | INTRAVENOUS | Status: AC
  Administered 2011-11-04: 10 mg via INTRAVENOUS
  Filled 2011-11-04 (×2): qty 1

## 2011-11-04 MED ORDER — ACETAMINOPHEN 325 MG PO TABS
650.0000 mg | ORAL_TABLET | Freq: Four times a day (QID) | ORAL | Status: DC | PRN
  Administered 2011-11-04 – 2011-11-05 (×2): 650 mg via ORAL
  Filled 2011-11-04 (×2): qty 2

## 2011-11-04 NOTE — Telephone Encounter (Signed)
Pt is hospital per son. Pt will make hosp fup

## 2011-11-04 NOTE — Progress Notes (Signed)
In and out catheter done to obtain for urine specimen using sterile technique. Able to obtain 300cc of urine. Patient well tolerated the procedure. Will continue to monitor patient.

## 2011-11-04 NOTE — H&P (Signed)
Internal Medicine Teaching Service Attending Note Date: 11/04/2011  Patient name: [name]  Medical record number: [id number]  Date of birth: [birth year]   I have seen and evaluated [name] and discussed their care with the Residency Team.    HPI: 76 y/o woman with PMH significant for dementia, HTN, GERD, PE in 2009, DVT of RLE in 08/12 comes to th with altered mental status.  Her daughter brought her here for evaluation because she is falling asleep more than usual and more rapidly than usual and was not urinating normally. The daughter also noted that she only had a very small bowel movement the day of admission but no cough, fever, vomiting, or diarrhea.  In the ER, as well, the patient had further decline throughout her course of stay, she would response intermittently to begin with but then she was barely arousable to painful stimuli when they got more concerned . Got a CT head that showed small subarachnoid hemorrhage.  The daughter though tells me she is now at her baseline.    Physical Exam: Blood pressure 128/48, pulse 74, temperature 98.7 F (37.1 C), temperature source Oral, resp. rate 18, height 4\' 10"  (1.473 m), weight 138 lb 10.7 oz (62.9 kg), SpO2 96.00%. BP 128/48  Pulse 74  Temp(Src) 98.7 F (37.1 C) (Oral)  Resp 18  Ht 4\' 10"  (1.473 m)  Wt 138 lb 10.7 oz (62.9 kg)  BMI 28.98 kg/m2  SpO2 96% General appearance: alert and no distress CV - RRR without m/r/g Lungs - cta b  Lab results: Results for orders placed during the hospital encounter of 11/03/11 (from the past 24 hour(s))  CBC     Status: Abnormal   Collection Time   11/03/11  3:31 PM      Component Value Range   WBC 11.9 (*) 4.0 - 10.5 (K/uL)   RBC 5.24 (*) 3.87 - 5.11 (MIL/uL)   Hemoglobin 14.2  12.0 - 15.0 (g/dL)   HCT 40.9  81.1 - 91.4 (%)   MCV 85.9  78.0 - 100.0 (fL)   MCH 27.1  26.0 - 34.0 (pg)   MCHC 31.6  30.0 - 36.0 (g/dL)   RDW 78.2  95.6 - 21.3 (%)   Platelets 187  150 - 400  (K/uL)  DIFFERENTIAL     Status: Abnormal   Collection Time   11/03/11  3:31 PM      Component Value Range   Neutrophils Relative 53  43 - 77 (%)   Neutro Abs 6.3  1.7 - 7.7 (K/uL)   Lymphocytes Relative 35  12 - 46 (%)   Lymphs Abs 4.2 (*) 0.7 - 4.0 (K/uL)   Monocytes Relative 10  3 - 12 (%)   Monocytes Absolute 1.2 (*) 0.1 - 1.0 (K/uL)   Eosinophils Relative 2  0 - 5 (%)   Eosinophils Absolute 0.3  0.0 - 0.7 (K/uL)   Basophils Relative 0  0 - 1 (%)   Basophils Absolute 0.0  0.0 - 0.1 (K/uL)  BASIC METABOLIC PANEL     Status: Abnormal   Collection Time   11/03/11  3:31 PM      Component Value Range   Sodium 149 (*) 135 - 145 (mEq/L)   Potassium 3.7  3.5 - 5.1 (mEq/L)   Chloride 112  96 - 112 (mEq/L)   CO2 28  19 - 32 (mEq/L)   Glucose, Bld 96  70 - 99 (mg/dL)   BUN 35 (*) 6 - 23 (  mg/dL)   Creatinine, Ser 4.09 (*) 0.50 - 1.10 (mg/dL)   Calcium 9.5  8.4 - 81.1 (mg/dL)   GFR calc non Af Amer 28 (*) >90 (mL/min)   GFR calc Af Amer 32 (*) >90 (mL/min)  URINALYSIS, ROUTINE W REFLEX MICROSCOPIC     Status: Abnormal   Collection Time   11/03/11  5:46 PM      Component Value Range   Color, Urine AMBER (*) YELLOW    APPearance TURBID (*) CLEAR    Specific Gravity, Urine 1.023  1.005 - 1.030    pH 6.0  5.0 - 8.0    Glucose, UA NEGATIVE  NEGATIVE (mg/dL)   Hgb urine dipstick MODERATE (*) NEGATIVE    Bilirubin Urine SMALL (*) NEGATIVE    Ketones, ur 15 (*) NEGATIVE (mg/dL)   Protein, ur NEGATIVE  NEGATIVE (mg/dL)   Urobilinogen, UA 1.0  0.0 - 1.0 (mg/dL)   Nitrite NEGATIVE  NEGATIVE    Leukocytes, UA LARGE (*) NEGATIVE   URINE MICROSCOPIC-ADD ON     Status: Abnormal   Collection Time   11/03/11  5:46 PM      Component Value Range   Squamous Epithelial / LPF FEW (*) RARE    WBC, UA 11-20  <3 (WBC/hpf)   RBC / HPF 3-6  <3 (RBC/hpf)   Bacteria, UA MANY (*) RARE    Urine-Other AMORPHOUS URATES/PHOSPHATES    CBC     Status: Normal   Collection Time   11/04/11 12:06 AM       Component Value Range   WBC 10.5  4.0 - 10.5 (K/uL)   RBC 4.95  3.87 - 5.11 (MIL/uL)   Hemoglobin 13.5  12.0 - 15.0 (g/dL)   HCT 91.4  78.2 - 95.6 (%)   MCV 85.7  78.0 - 100.0 (fL)   MCH 27.3  26.0 - 34.0 (pg)   MCHC 31.8  30.0 - 36.0 (g/dL)   RDW 21.3  08.6 - 57.8 (%)   Platelets 179  150 - 400 (K/uL)  PROTIME-INR     Status: Abnormal   Collection Time   11/04/11 12:06 AM      Component Value Range   Prothrombin Time 39.1 (*) 11.6 - 15.2 (seconds)   INR 3.94 (*) 0.00 - 1.49   BASIC METABOLIC PANEL     Status: Abnormal   Collection Time   11/04/11 12:06 AM      Component Value Range   Sodium 151 (*) 135 - 145 (mEq/L)   Potassium 3.6  3.5 - 5.1 (mEq/L)   Chloride 116 (*) 96 - 112 (mEq/L)   CO2 25  19 - 32 (mEq/L)   Glucose, Bld 75  70 - 99 (mg/dL)   BUN 32 (*) 6 - 23 (mg/dL)   Creatinine, Ser 4.69 (*) 0.50 - 1.10 (mg/dL)   Calcium 8.9  8.4 - 62.9 (mg/dL)   GFR calc non Af Amer 37 (*) >90 (mL/min)   GFR calc Af Amer 43 (*) >90 (mL/min)  ABO/RH     Status: Normal   Collection Time   11/04/11 12:06 AM      Component Value Range   ABO/RH(D) A POS    SODIUM, URINE, RANDOM     Status: Normal   Collection Time   11/04/11  2:58 AM      Component Value Range   Sodium, Ur 42    OSMOLALITY, URINE     Status: Normal   Collection Time   11/04/11  2:58  AM      Component Value Range   Osmolality, Ur 664  390 - 1090 (mOsm/kg)  PREPARE FRESH FROZEN PLASMA     Status: Normal (Preliminary result)   Collection Time   11/04/11  5:30 AM      Component Value Range   Unit Number [id number]     Blood Component Type THAWED PLASMA     Unit division 00     Status of Unit ALLOCATED     Transfusion Status OK TO TRANSFUSE     Unit Number [id number]     Blood Component Type THAWED PLASMA     Unit division 00     Status of Unit ISSUED     Transfusion Status OK TO TRANSFUSE    TYPE AND SCREEN     Status: Normal   Collection Time   11/04/11  5:40 AM      Component Value Range   ABO/RH(D) A POS      Antibody Screen NEG     Sample Expiration 11/07/2011    CBC     Status: Abnormal   Collection Time   11/04/11  8:23 AM      Component Value Range   WBC 9.2  4.0 - 10.5 (K/uL)   RBC 4.29  3.87 - 5.11 (MIL/uL)   Hemoglobin 11.7 (*) 12.0 - 15.0 (g/dL)   HCT 56.2  13.0 - 86.5 (%)   MCV 86.2  78.0 - 100.0 (fL)   MCH 27.3  26.0 - 34.0 (pg)   MCHC 31.6  30.0 - 36.0 (g/dL)   RDW 78.4  69.6 - 29.5 (%)   Platelets 154  150 - 400 (K/uL)  PROTIME-INR     Status: Abnormal   Collection Time   11/04/11  8:23 AM      Component Value Range   Prothrombin Time 21.3 (*) 11.6 - 15.2 (seconds)   INR 1.81 (*) 0.00 - 1.49     Imaging results:  Ct Head Wo Contrast  11/03/2011  *RADIOLOGY REPORT*  Clinical Data: Fatigue, sudden onset altered mental status  CT HEAD WITHOUT CONTRAST  Technique:  Contiguous axial images were obtained from the base of the skull through the vertex without contrast.  Comparison: 10/29/2011  Findings: Generalized atrophy. Stable prominence of the ventricular system. No midline shift or mass effect. Small vessel chronic ischemic changes of deep cerebral white matter. New small focus focus of high attenuation acute subarachnoid hemorrhage in a sulcus in the right frontal region. No evidence of acute infarction or mass. Minimal mucosal thickening in a few ethmoid air cells. Bones diffusely demineralized.  IMPRESSION: Atrophy with small vessel chronic ischemic changes of deep cerebral white matter. New small focus of acute subarachnoid hemorrhage in a right frontal sulcus.  Critical Value/emergent results were called by telephone at the time of interpretation on 11/03/2011  at 1842 hrs  to  Grant Fontana, who verbally acknowledged these results.  Original Report Authenticated By: Lollie Marrow, M.D.   Dg Abd Acute W/chest  11/03/2011  *RADIOLOGY REPORT*  Clinical Data: Constipation for 1 week.  ACUTE ABDOMEN SERIES (ABDOMEN 2 VIEW & CHEST 1 VIEW)  Comparison: 07/20/2011 CT of the abdomen.   Findings: The bowel gas pattern is normal.  There is retained stool noted throughout the colon.  There is no evidence for free peritoneal air.  There is sclerotic density associated the right anterior sixth through eighth ribs most consistent with probable healing lower right anterior rib fractures.  There are surgical  clips within the right upper quadrant from a previous cholecystectomy.  There is a left hip prosthesis present.  The upright chest demonstrates mild cardiomegaly.  There are no infiltrates or edematous changes.  There is no evidence for pneumothorax.  IMPRESSION:  1.  Moderate retained colonic stool. 2.  No evidence for bowel obstruction or free peritoneal air. 3.  Probable healing anterior right sixth through eighth rib fractures with callous formation.  Original Report Authenticated By: Rolla Plate, M.D.    Assessment and Plan: I agree with the formulated Assessment and Plan with the following changes: She now is improving and is at her baseline.  She will cotinue with antibiotics and await urine culture.  No acute intervention for [facility] in the setting of improving mental status and poor operative candidate.  Will continue to hold off on coumadin.

## 2011-11-04 NOTE — Progress Notes (Signed)
Subjective:  Patient feels ok today. More alert and able to communicate with family and staff. Patient is calm and pleasant. No c/o, no acute distress noted. Daughter is at bedside and questions answered.  Objective: Vital signs in last 24 hours: Filed Vitals:   11/04/11 0640 11/04/11 0710 11/04/11 0852 11/04/11 1521  BP: 113/53 118/58 128/48 128/67  Pulse: 70 67 74 81  Temp: 97.8 F (36.6 C) 97.3 F (36.3 C) 98.7 F (37.1 C) 100.2 F (37.9 C)  TempSrc: Oral Axillary Oral Oral  Resp: 18 17 18 18   Height:      Weight:      SpO2:    96%   Weight change:   Intake/Output Summary (Last 24 hours) at 11/04/11 1706 Last data filed at 11/04/11 0700  Gross per 24 hour  Intake 720.83 ml  Output    300 ml  Net 420.83 ml   Physical Exam: General: alert, calm and pleasant, well-developed, and somehow not completely cooperative to examination due to her dementia.  Head: normocephalic and atraumatic.  Eyes: vision grossly intact, pupils equal, pupils round, pupils reactive to light, no injection and anicteric.  Mouth: pharynx pink and moist, no erythema, and no exudates.  Neck: supple, full ROM, no thyromegaly, no JVD, and no carotid bruits.  Lungs: normal respiratory effort, no accessory muscle use, normal breath sounds, no crackles, and no wheezes. Heart: normal rate, regular rhythm, no murmur, no gallop, and no rub.  Abdomen: soft, non-tender, normal bowel sounds, no distention, no guarding, no rebound tenderness, no hepatomegaly, and no splenomegaly.  Msk: no joint swelling, no joint warmth, and no redness over joints.  Pulses: 2+ DP/PT pulses bilaterally Extremities: No cyanosis, clubbing, edema Neurologic: alert & oriented self and family, gross neuro exam is WNL. Unable to perform thorough neuro exam due to dementia. Skin: turgor normal and no rashes.  .   Lab Results: Basic Metabolic Panel:  Lab 11/04/11 1610 11/03/11 1531  Akane Tessier 151* 149*  K 3.6 3.7  CL 116* 112  CO2 25  28  GLUCOSE 75 96  BUN 32* 35*  CREATININE 1.31* 1.68*  CALCIUM 8.9 9.5  MG -- --  PHOS -- --   CBC:  Lab 11/04/11 0823 11/04/11 0006 11/03/11 1531  WBC 9.2 10.5 --  NEUTROABS -- -- 6.3  HGB 11.7* 13.5 --  HCT 37.0 42.4 --  MCV 86.2 85.7 --  PLT 154 179 --   Coagulation:  Lab 11/04/11 0823 11/04/11 0006 10/31/11 1502 10/29/11 1815  LABPROT 21.3* 39.1* -- 16.7*  INR 1.81* 3.94* 1.9 1.33    Studies/Results: Ct Head Wo Contrast  11/03/2011  *RADIOLOGY REPORT*  Clinical Data: Fatigue, sudden onset altered mental status  CT HEAD WITHOUT CONTRAST  Technique:  Contiguous axial images were obtained from the base of the skull through the vertex without contrast.  Comparison: 10/29/2011  Findings: Generalized atrophy. Stable prominence of the ventricular system. No midline shift or mass effect. Small vessel chronic ischemic changes of deep cerebral white matter. New small focus focus of high attenuation acute subarachnoid hemorrhage in a sulcus in the right frontal region. No evidence of acute infarction or mass. Minimal mucosal thickening in a few ethmoid air cells. Bones diffusely demineralized.  IMPRESSION: Atrophy with small vessel chronic ischemic changes of deep cerebral white matter. New small focus of acute subarachnoid hemorrhage in a right frontal sulcus.  Critical Value/emergent results were called by telephone at the time of interpretation on 11/03/2011  at 1842 hrs  to  Grant Fontana, who verbally acknowledged these results.  Original Report Authenticated By: Lollie Marrow, M.D.   Dg Abd Acute W/chest  11/03/2011  *RADIOLOGY REPORT*  Clinical Data: Constipation for 1 week.  ACUTE ABDOMEN SERIES (ABDOMEN 2 VIEW & CHEST 1 VIEW)  Comparison: 07/20/2011 CT of the abdomen.  Findings: The bowel gas pattern is normal.  There is retained stool noted throughout the colon.  There is no evidence for free peritoneal air.  There is sclerotic density associated the right anterior sixth through  eighth ribs most consistent with probable healing lower right anterior rib fractures.  There are surgical clips within the right upper quadrant from a previous cholecystectomy.  There is a left hip prosthesis present.  The upright chest demonstrates mild cardiomegaly.  There are no infiltrates or edematous changes.  There is no evidence for pneumothorax.  IMPRESSION:  1.  Moderate retained colonic stool. 2.  No evidence for bowel obstruction or free peritoneal air. 3.  Probable healing anterior right sixth through eighth rib fractures with callous formation.  Original Report Authenticated By: Rolla Plate, M.D.   Medications: I have reviewed the patient's current medications. Scheduled Meds:   . cefTRIAXone (ROCEPHIN)  IV  1 g Intravenous Once  . cefTRIAXone (ROCEPHIN)  IV  1 g Intravenous Q24H  . methylPREDNISolone (SOLU-MEDROL) injection  10 mg Intravenous Daily  . olopatadine  1 drop Both Eyes BID  . phytonadione (VITAMIN K) IV  10 mg Intravenous Once  . DISCONTD: azelastine  1 drop Both Eyes BID  . DISCONTD: cefTRIAXone  1 g Intramuscular Once   Continuous Infusions:   . sodium chloride 100 mL/hr at 11/04/11 1216  . DISCONTD: sodium chloride 125 mL/hr at 11/03/11 2235   PRN Meds:.acetaminophen, ondansetron (ZOFRAN) IV, ondansetron Assessment/Plan:  1. Altered mental status: Likely 2/2 UTI and subarachnoid hemorrhage in the setting of chronic coumadin for DVT and PE. This can be further complicated by electrolyte disturbances( hypernatremia) in the setting of dehydration.  Of note patient as underlying dementia at baseline and is usually not very interactive. Pt has vitamin K and FFP last night and her most recent INR is 1.81.   -Neurochecks q4.   -CBC Q8 hours and re-check her INR in am. -Continue to hold coumadin and ASA in the setting of subarachnoid hemorrhage  -Hold home antihypertensives and memantine. -Hydrate with IV fluids while on NPO status, pending Speech evaluation and  diet recommendation.   2.  subarachnoid hemorrhage -Symptomatic management -Will not consult Neurosurgery as the patient is DNR/DNI and the family agrees with it.   3. UTI, confirmed with UA -Treat UTI with ceftriaxone   2. Hypernatremia: etiology unclear but could be related to dehydration in the setting of decreased intake and insensible losses.  -continue 1/2 NS at 100 cc/hr.  -Recheck BMET in AM. .   3. Acute renal failure: likely pre- renal secondary to dehydration.  - Hydrate with IVF  -F/U on BMET.   4. Hypertension: BP stable Plan: Hold home antihypertensive meds.   5. Leucocytosis; likely secondary to UTI versus stress de margination.  Plan: Treat UTI with ceftriaxone.  Continue to monitor.   6. DVT: SCD's      LOS: 1 day   Muhamad Serano 11/04/2011, 5:06 PM

## 2011-11-04 NOTE — Progress Notes (Signed)
Speech Language/Pathology Clinical/Bedside Swallow Evaluation Patient Details  Name: [name] MRN: [id number] DOB: 03-28-1929 Today's Date: 11/04/2011  Past Medical History:  Past Medical History  Diagnosis Date  . Asthma   . Hypertension   . Osteoporosis   . GERD (gastroesophageal reflux disease)   . Dementia   . Pulmonary embolism     hx of 4/09, sees Dr. [name]   . Leg edema   . DVT (deep venous thrombosis)    Past Surgical History:  Past Surgical History  Procedure Date  . Cholecystectomy   . Lithotripsy   . Right knee surgery    Other Pertinent Information: Pts dtr reports intermittent dysphagia during perirods of illness.  She describes dysphagia as pt is unable to feed herself.  She does report that pt cannot use straws because it chokes her. She reports the pt has had objective swallow testing but is only able to say that she did well.  There is no record of any previous swallow testing in echart. The dtr also reports her mother is now able to feed herself, drinks thin liquids and is able to masticate soft foods though she does not have any dentition at this time.  Type of Study: Bedside swallow evaluation  Assessment/Recommendations/Treatment Plan    SLP Assessment Clinical Impression Statement: Pt presents with a mild oral phase dysphagia due to cognitive deficits and missing dentition.  Though the pt exhibits brief oral holding, her consumption is functional without any indication of aspiraiton. Straws were not attempted as dtr reports the pt cannot tolerate them.  Given this report suspect some component of pharyngeal dysphagia, however pt again appears functional with small sips.  Risk for Aspiration: Mild Other Related Risk Factors: History of pneumonia;History of dysphagia  Recommendations Solid Consistency: Dysphagia 3 (Mechanical soft) Liquid Consistency: Thin Liquid Administration via: No straw;Cup Medication Administration: Whole meds with  puree Supervision: Full supervision/cueing for compensatory strategies Compensations: Slow rate;Small sips/bites Postural Changes and/or Swallow Maneuvers: Seated upright 90 degrees     Individuals Consulted Consulted and Agree with Results and Recommendations: Patient;Family member/caregiver Family Member Consulted: daughter  Swallow Study Goals  SLP Swallowing Goals Patient will consume recommended diet without observed clinical signs of aspiration with: Minimal assistance Swallow Study Goal #1 - Progress: Progressing toward goal Patient will utilize recommended strategies during swallow to increase swallowing safety with: Minimal cueing Swallow Study Goal #2 - Progress: Progressing toward goal  Swallow Study Prior Functional Status     General  Diet Prior to this Study: Dysphagia 3 (soft);Thin liquids Temperature Spikes Noted: No Respiratory Status: Room air Behavior/Cognition: Alert;Cooperative;Confused Oral Cavity - Dentition: Edentulous Vision: Functional for self-feeding Patient Positioning: Upright in bed Baseline Vocal Quality: Normal Volitional Cough: Strong Volitional Swallow: Unable to elicit Ice chips: Not tested  Oral Motor/Sensory Function  Labial ROM: Within Functional Limits Labial Symmetry: Within Functional Limits Labial Strength: Within Functional Limits Lingual ROM: Within Functional Limits Lingual Symmetry: Within Functional Limits Lingual Strength: Within Functional Limits Facial ROM: Within Functional Limits Facial Symmetry: Within Functional Limits Facial Strength: Within Functional Limits Velum: Within Functional Limits Mandible: Within Functional Limits  Consistency Results  Ice Chips Ice chips: Not tested  Thin Liquid Thin Liquid: Impaired Presentation: Cup;Self Fed Oral Phase Functional Implications: Oral holding;Other (comment) (Pt holds bolus for several seconds before initiating transit) Pharyngeal  Phase Impairments: Delayed  Swallow  Nectar Thick Liquid Nectar Thick Liquid: Not tested  Honey Thick Liquid Honey Thick Liquid: Not tested  Puree Puree: Impaired Presentation:  Self Fed;Spoon Oral Phase Functional Implications: Oral holding Pharyngeal Phase Impairments: Delayed Swallow  Solid Solid: Impaired Oral Phase Functional Implications: Other (comment) (slow mastication without dentition) Pharyngeal Phase Impairments: Delayed Swallow  Harlon Ditty, MA CCC-SLP [phone]   Dariah Mcsorley, Riley Nearing 11/04/2011,4:30 PM

## 2011-11-04 NOTE — Progress Notes (Signed)
Speech Language Pathology Attempted bedside swallow evaluation however patient receiving a bath and not available.  Will attempt to see today, however may have to follow-up for evaluation in early a.m.  Myra Rude, M.S.,CCC-SLP Pager [phone]

## 2011-11-05 DIAGNOSIS — D72829 Elevated white blood cell count, unspecified: Secondary | ICD-10-CM | POA: Diagnosis present

## 2011-11-05 DIAGNOSIS — E870 Hyperosmolality and hypernatremia: Secondary | ICD-10-CM | POA: Diagnosis present

## 2011-11-05 DIAGNOSIS — N179 Acute kidney failure, unspecified: Secondary | ICD-10-CM | POA: Diagnosis present

## 2011-11-05 DIAGNOSIS — N390 Urinary tract infection, site not specified: Secondary | ICD-10-CM | POA: Diagnosis present

## 2011-11-05 DIAGNOSIS — I609 Nontraumatic subarachnoid hemorrhage, unspecified: Secondary | ICD-10-CM | POA: Diagnosis present

## 2011-11-05 DIAGNOSIS — R4182 Altered mental status, unspecified: Secondary | ICD-10-CM

## 2011-11-05 LAB — CBC
HCT: 34.9 % — ABNORMAL LOW (ref 36.0–46.0)
HCT: 38.2 % (ref 36.0–46.0)
Hemoglobin: 11.2 g/dL — ABNORMAL LOW (ref 12.0–15.0)
Hemoglobin: 12.3 g/dL (ref 12.0–15.0)
MCH: 26.9 pg (ref 26.0–34.0)
MCH: 27.2 pg (ref 26.0–34.0)
MCHC: 32.2 g/dL (ref 30.0–36.0)
MCV: 83.7 fL (ref 78.0–100.0)
MCV: 84.3 fL (ref 78.0–100.0)
RBC: 4.17 MIL/uL (ref 3.87–5.11)
RBC: 4.53 MIL/uL (ref 3.87–5.11)
WBC: 10.1 10*3/uL (ref 4.0–10.5)

## 2011-11-05 LAB — PREPARE FRESH FROZEN PLASMA: Unit division: 0

## 2011-11-05 LAB — BASIC METABOLIC PANEL
BUN: 22 mg/dL (ref 6–23)
CO2: 23 mEq/L (ref 19–32)
Calcium: 8.6 mg/dL (ref 8.4–10.5)
Chloride: 111 mEq/L (ref 96–112)
Creatinine, Ser: 1.05 mg/dL (ref 0.50–1.10)
Glucose, Bld: 119 mg/dL — ABNORMAL HIGH (ref 70–99)

## 2011-11-05 LAB — PROTIME-INR: Prothrombin Time: 16.3 seconds — ABNORMAL HIGH (ref 11.6–15.2)

## 2011-11-05 MED ORDER — DSS 100 MG PO CAPS: 100.0000 mg | ORAL_CAPSULE | Freq: Two times a day (BID) | ORAL | Status: AC

## 2011-11-05 MED ORDER — ALBUTEROL SULFATE (5 MG/ML) 0.5% IN NEBU
2.5000 mg | INHALATION_SOLUTION | Freq: Four times a day (QID) | RESPIRATORY_TRACT | Status: DC | PRN
  Administered 2011-11-05: 2.5 mg via RESPIRATORY_TRACT
  Filled 2011-11-05: qty 0.5

## 2011-11-05 MED ORDER — SULFAMETHOXAZOLE-TRIMETHOPRIM 800-160 MG PO TABS: 1.0000 | ORAL_TABLET | Freq: Two times a day (BID) | ORAL | Status: AC

## 2011-11-05 NOTE — Progress Notes (Signed)
Physical Therapy Evaluation Patient Details Name: [name] MRN: [id number] DOB: 30-Aug-1929 Today's Date: 11/05/2011  Problem List:  Patient Active Problem List  Diagnoses  . DEMENTIA  . ALLERGIC CONJUNCTIVITIS  . HYPERTENSION  . VENOUS EMBO&THROMB DEEP VES PROX LOWER EXTREM  . HEMORRHOIDS, EXTERNAL  . ASTHMATIC BRONCHITIS, ACUTE  . ALLERGIC RHINITIS  . ASTHMA  . PULMONARY EDEMA  . GERD  . Irritable bowel syndrome  . ACUTE CYSTITIS  . UTI  . MICROSCOPIC HEMATURIA  . OSTEOPOROSIS  . LEG EDEMA  . ABDOMINAL PAIN, GENERALIZED  . KNEE SPRAIN  . ABRASION, KNEE  . CONTUSION OF BUTTOCK  . PULMONARY EMBOLISM, HX OF  . GASTRIC ULCER, HX OF  . RENAL CALCULUS, HX OF  . CHICKENPOX, HX OF  . Subarachnoid hemorrhage  . Altered mental status    Past Medical History:  Past Medical History  Diagnosis Date  . Asthma   . Hypertension   . Osteoporosis   . GERD (gastroesophageal reflux disease)   . Dementia   . Pulmonary embolism     hx of 4/09, sees Dr. [name]   . Leg edema   . DVT (deep venous thrombosis)    Past Surgical History:  Past Surgical History  Procedure Date  . Cholecystectomy   . Lithotripsy   . Right knee surgery     PT Assessment/Plan/Recommendation PT Assessment Clinical Impression Statement: pt with dementia has been requiring 24hr care from family at home and would benefit from HHPT when D/C's home with family.   PT Recommendation/Assessment: Patient will need skilled PT in the acute care venue PT Problem List: Decreased strength;Decreased activity tolerance;Decreased balance;Decreased mobility;Decreased coordination;Decreased cognition;Decreased knowledge of use of DME;Decreased safety awareness;Pain Barriers to Discharge: None PT Therapy Diagnosis : Difficulty walking;Generalized weakness PT Plan PT Frequency: Min 3X/week PT Treatment/Interventions: DME instruction;Gait training;Stair training;Therapeutic activities;Functional mobility  training;Therapeutic exercise;Balance training;Patient/family education PT Recommendation Follow Up Recommendations: Home health PT;24 hour supervision/assistance Equipment Recommended:  [facility] Bed) PT Goals  Acute Rehab PT Goals PT Goal Formulation: With patient/family Time For Goal Achievement: 2 weeks Pt will go Supine/Side to Sit: with min assist PT Goal: Supine/Side to Sit - Progress: Progressing toward goal Pt will go Sit to Stand: with min assist PT Goal: Sit to Stand - Progress: Progressing toward goal Pt will Stand: with supervision;3 - 5 min;with bilateral upper extremity support PT Goal: Stand - Progress: Progressing toward goal Pt will Ambulate: 51 - 150 feet;with min assist;with rolling walker PT Goal: Ambulate - Progress: Progressing toward goal  PT Evaluation Precautions/Restrictions  Precautions Precautions: Fall Restrictions Weight Bearing Restrictions: No Prior Functioning  Home Living Lives With: Family;Daughter Receives Help From: Family Type of Home: House Home Layout: One level Home Access: Stairs to enter Entrance Stairs-Rails: None Entrance Stairs-Number of Steps: 3 Bathroom Shower/Tub:  (Sponge bath on 3-in-1) Bathroom Toilet:  (uses 3-in-1) Prior Function Level of Independence: Needs assistance with ADLs;Needs assistance with homemaking;Needs assistance with gait;Needs assistance with tranfers Able to Take Stairs?:  (with assistance) Driving: No Cognition Cognition Orientation Level: Oriented to person;Disoriented to place;Disoriented to time;Disoriented to situation Sensation/Coordination   Extremity Assessment RLE Assessment RLE Assessment:  (AROM WFL however difficulty following directions for MMT) LLE Assessment LLE Assessment:  (AROM WFL, however difficulty following directions for MMT) Mobility (including Balance) Bed Mobility Bed Mobility: Yes Supine to Sit: 2: Max assist;HOB flat Supine to Sit Details (indicate cue type and  reason): pt fearful of falling and Dtr notes she is requiring a little  more A than she normally would.   Sitting - Scoot to Edge of Bed: 2: Max assist Sitting - Scoot to [place] of Bed Details (indicate cue type and reason): Max cueing for encouragement as pt fearful of falling.   Transfers Transfers: Yes Sit to Stand: 2: Max assist;From bed;Without upper extremity assist Sit to Stand Details (indicate cue type and reason): MAx cueing and encouragement for attention to task, upright posture and secondary fear of falling Stand to Sit: 3: Mod assist;With upper extremity assist;To chair/3-in-1 Stand to Sit Details: cues for UE use and to turn all the way to chair prior to sitting.   Ambulation/Gait Ambulation/Gait: Yes Ambulation/Gait Assistance: 1: +2 Total assist;Patient percentage (comment) (pt 70%) Ambulation/Gait Assistance Details (indicate cue type and reason): Max cueing and A for attention to task, RW use/safety, facilitation for wt shift at turns.   Ambulation Distance (Feet): 50 Feet Assistive device: Rolling walker Gait Pattern: Decreased stride length;Shuffle;Trunk flexed Stairs: No Wheelchair Mobility Wheelchair Mobility: No    Exercise    End of Session PT - End of Session Equipment Utilized During Treatment: Gait belt Activity Tolerance: Patient limited by fatigue Patient left: in chair;with call bell in reach;with family/visitor present General Behavior During Session: [facility] for tasks performed Cognition: Impaired, at baseline  Sunny Schlein, [place] [phone] 11/05/2011, 10:59 AM

## 2011-11-05 NOTE — Progress Notes (Signed)
Physical Therapy Patient Details Name: [name] MRN: [id number] DOB: 02/06/1929 Today's Date: 11/05/2011  Pt's Dtr would like info and A with [facility].    Mack Hook F,PT  [phone] 11/05/2011, 11:00 AM

## 2011-11-05 NOTE — Progress Notes (Signed)
Patient to be discharged home via ambulance at 1530. Education complete. Daughter verbalizes understanding. SAH education included. Will close plan of care.

## 2011-11-05 NOTE — Discharge Summary (Signed)
Internal Medicine Teaching [facility] Discharge Note  Name: [name] MRN: [id number] DOB: October 30, 1929 77 y.o.  Date of Admission: 11/03/2011 11:46 AM Date of Discharge: 11/05/2011 Attending Physician: Staci Righter, MD  Discharge Diagnosis:  1. Altered mental status, resolved. 2. SAH (subarachnoid hemorrhage), improving.  3. Urinary tract infection 4.  Hypernatremia, resolved. 5.  Renal failure, acute, resolved. 6.  Leukocytosis 7. Dementia    Discharge Medications: Current Discharge Medication List    START taking these medications   Details  docusate sodium 100 MG CAPS Take 100 mg by mouth 2 (two) times daily. Qty: 30 capsule, Refills: 2    sulfamethoxazole-trimethoprim (BACTRIM DS) 800-160 MG per tablet Take 1 tablet by mouth 2 (two) times daily. Qty: 14 tablet, Refills: 0      CONTINUE these medications which have NOT CHANGED   Details  albuterol (PROVENTIL) (2.5 MG/3ML) 0.083% nebulizer solution Take 2.5 mg by nebulization every 6 (six) hours as needed. For shortness of breath    Ascorbic Acid (VITAMIN C) 500 MG tablet Take 500 mg by mouth daily.      azelastine (OPTIVAR) 0.05 % ophthalmic solution Place 1 drop into both eyes 2 (two) times daily.      budesonide (PULMICORT) 0.5 MG/2ML nebulizer solution Take 0.5 mg by nebulization 2 (two) times daily as needed. wheezing    calcium carbonate (TUMS - DOSED IN MG ELEMENTAL CALCIUM) 500 MG chewable tablet Chew 2 tablets by mouth daily.      Cholecalciferol (VITAMIN D) 2000 UNITS tablet Take 2,000 Units by mouth daily.      esomeprazole (NEXIUM) 40 MG capsule Take 40 mg by mouth at bedtime.     fluticasone (FLONASE) 50 MCG/ACT nasal spray Place 2 sprays into the nose 2 (two) times daily as needed. For congestion and runny nose    hydrOXYzine (ATARAX) 25 MG tablet Take 25 mg by mouth every 4 (four) hours as needed. For itch    memantine (NAMENDA) 10 MG tablet Take 10 mg by mouth 2 (two) times daily.        metoprolol succinate (TOPROL-XL) 12.5 mg TB24 Take 0.5 tablets (12.5 mg total) by mouth 2 (two) times daily. Qty: 60 tablet, Refills: 11    montelukast (SINGULAIR) 10 MG tablet Take 10 mg by mouth at bedtime.      Multiple Vitamins-Minerals (MULTIVITAMINS THER. W/MINERALS) TABS Take 1 tablet by mouth daily.      oxyCODONE-acetaminophen (PERCOCET) 5-325 MG per tablet Take 1 tablet by mouth every 6 (six) hours as needed. For pain    predniSONE (DELTASONE) 10 MG tablet Take 10 mg by mouth daily after breakfast. Or see bottom of medication calender for specific tapering instructions    dextromethorphan-guaiFENesin (MUCINEX DM) 30-600 MG per 12 hr tablet Take 1 tablet by mouth every 12 (twelve) hours as needed. For Cough and wheeze    ondansetron (ZOFRAN ODT) 4 MG disintegrating tablet Take 1 tablet (4 mg total) by mouth every 6 (six) hours as needed for nausea. Qty: 6 tablet, Refills: 0      STOP taking these medications     aspirin 81 MG chewable tablet      warfarin (COUMADIN) 5 MG tablet         Disposition and follow-up:   [name] was discharged from [facility] in Stable condition.    Follow-up Appointments: Follow-up Information    Follow up with [name],[name] in 1 week.   Contact information:   [address]  [facility] [place] [address] [phone]      Please follow up on her Head CT for complete resolution of her SAH (subarachnoid hemorrhage) in a couple of month.  Her Aspirin and Coumadin are discontinued. Pt and daughter are educated on avoiding blood thinners and OTC NSAID'S.     [name] has been on chronic prednisone 10 mg po daily for at least 7 years per daughter report. Please re-evaluate her need for long term prednisone and consider tapering if off if applicable.   [name] will be discharged with Bactrim 7 days therapy for her UTI.    Please check her CBC for complete resolution of her  leukocytosis.       Consultations: Treatment Team:  [name]  Procedures Performed:  Dg Hip Complete Left  10/29/2011  *RADIOLOGY REPORT*  Clinical Data: Fall and left hip pain.  LEFT HIP - COMPLETE 2+ VIEW  Comparison: 06/03/2011  Findings: There is a left hip arthroplasty.  The left hip arthroplasty is located.  No evidence for a periprosthetic fracture.  There are heterotopic ossifications surrounding the left hip. Large amount of stool in the lower abdomen and pelvis.  No gross bony abnormality to the right hip.  IMPRESSION: Left hip arthroplasty.  No acute bony abnormality.  Original Report Authenticated By: [name], M.D.   Dg Ankle Complete Right  10/29/2011  *RADIOLOGY REPORT*  Clinical Data: Diffuse right foot and ankle pain.  RIGHT ANKLE - COMPLETE 3+ VIEW  Comparison: None.  Findings: Three views of the right ankle are negative for acute fracture or dislocation.  Normal alignment of the right ankle.  No gross soft tissue abnormality.  IMPRESSION: No acute bony abnormality to the right ankle.  Original Report Authenticated By: [name], M.D.   Ct Head Wo Contrast  11/03/2011  *RADIOLOGY REPORT*  Clinical Data: Fatigue, sudden onset altered mental status  CT HEAD WITHOUT CONTRAST  Technique:  Contiguous axial images were obtained from the base of the skull through the vertex without contrast.  Comparison: 10/29/2011  Findings: Generalized atrophy. Stable prominence of the ventricular system. No midline shift or mass effect. Small vessel chronic ischemic changes of deep cerebral white matter. New small focus focus of high attenuation acute subarachnoid hemorrhage in a sulcus in the right frontal region. No evidence of acute infarction or mass. Minimal mucosal thickening in a few ethmoid air cells. Bones diffusely demineralized.  IMPRESSION: Atrophy with small vessel chronic ischemic changes of deep cerebral white matter. New small focus of acute subarachnoid  hemorrhage in a right frontal sulcus.  Critical Value/emergent results were called by telephone at the time of interpretation on 11/03/2011  at 1842 hrs  to  [name], who verbally acknowledged these results.  Original Report Authenticated By: [name], M.D.   Ct Head Wo Contrast  10/29/2011  *RADIOLOGY REPORT*  Clinical Data: Headache with vomiting.  History of fall 1 week ago. On Coumadin therapy.  CT HEAD WITHOUT CONTRAST  Technique:  Contiguous axial images were obtained from the base of the skull through the vertex without contrast.  Comparison: Head CT 07/03/2011.  Findings: Atrophy primarily involving the frontal and temporal lobes is again demonstrated.  There is diffuse periventricular white matter low density.  No acute intracranial hemorrhage, mass lesion, brain edema or extra-axial fluid collection is demonstrated.  Ventricle size is stable.  There is no hydrocephalus.  Possible air fluid level is again noted in the left maxillary  sinus.  There is mild ethmoid sinus mucosal thickening.  The left mastoid air cells are partially opacified.  There is no coalescence.  No acute osseous findings are identified.  IMPRESSION:  1.  No acute intracranial findings identified. 2.  Stable atrophy and periventricular white matter disease. 3.  Stable left maxillary and mastoid sinus partial opacification.  Original Report Authenticated By: [name], M.D.   Dg Abd Acute W/chest  11/03/2011  *RADIOLOGY REPORT*  Clinical Data: Constipation for 1 week.  ACUTE ABDOMEN SERIES (ABDOMEN 2 VIEW & CHEST 1 VIEW)  Comparison: 07/20/2011 CT of the abdomen.  Findings: The bowel gas pattern is normal.  There is retained stool noted throughout the colon.  There is no evidence for free peritoneal air.  There is sclerotic density associated the right anterior sixth through eighth ribs most consistent with probable healing lower right anterior rib fractures.  There are surgical clips within the right upper  quadrant from a previous cholecystectomy.  There is a left hip prosthesis present.  The upright chest demonstrates mild cardiomegaly.  There are no infiltrates or edematous changes.  There is no evidence for pneumothorax.  IMPRESSION:  1.  Moderate retained colonic stool. 2.  No evidence for bowel obstruction or free peritoneal air. 3.  Probable healing anterior right sixth through eighth rib fractures with callous formation.  Original Report Authenticated By: [name], M.D.   Dg Foot Complete Right  10/29/2011  *RADIOLOGY REPORT*  Clinical Data: Diffuse foot and ankle pain  RIGHT FOOT COMPLETE - 3+ VIEW  Comparison: Ankle dated 10/29/2011  Findings: Three views of the right foot are negative for acute fracture or dislocation.  No gross alignment abnormality. Lucency along the distal aspect of the fifth metatarsal bone on the lateral view but a normal appearance on the other two views.  IMPRESSION: No acute bony abnormality to the right foot.  Original Report Authenticated By: [name], M.D.    Admission HPI:  77 y/o woman with PMH significant for dementia, HTN, GERD, PE in 2009, DVT of RLE in 08/12 comes to the ER presents to the ER with altered mental status.  History is obtained from the chart as [name] was in altered mental status, not following commands and barely arousable even to painful stimuli.  Her daughter brought her here for evaluation because she is falling asleep more than usual and more rapidly than usual. Her daughter noted that she had a dry pull up on the morning of her admission, which is unusual for her. Her daughter also noted that she only had a very small bowel movement today, which is also unusual. She has not had a cough, fever, vomiting, or diarrhea.  In the ER, as well, the [name] had further decline throughout her course of stay, she would response intermittently to begin with but then she was barely arousable to painful stimuli when they got more concerned . Got a  CT head that showed small subarachnoid hemorrhage.     Hospital Course by problem list: 1. Altered mental status: resolved. [name] presents with AMS, which is likely 2/2 UTI and subarachnoid hemorrhage in the setting of chronic coumadin for DVT and PE. This can be further complicated by electrolyte disturbances( hypernatremia) in the setting of dehydration. Of note [name] as underlying dementia at baseline and is usually not very interactive.   Her Aspirin and Coumadin have been on hold during this admission. Pt was treated with vitamin K and FFP on admission and her INR  is 1.29 at the time of discharge. We have also treated her UTI with ceftriaxone and electrolytes imbalance with gentle fluid replacement. Upon discharge, Her AMS has completely resolved. [name] is hemodynamically stable and ready to go home. She will continue to follow up with [facility] for PT/OT/ST as outpatient.   2. subarachnoid hemorrhage   [name] presents with AMS and head CT confirmed "New small focus of acute subarachnoid hemorrhage in a right frontal sulcus." Her Aspirin and Coumadin have been on hold during this admission. She was treated with symptomatic management and PT/OT/ST. Her condition has largely improved upon discharge. [name] is ready to be discharged home today with continuous outpatient PT/OT/ST followup at [facility] program.  3. Urinary tract infection, which is confirmed with UA. [name] has been treated with ceftriaxone IV during this admission. She will be discharged with 7-day treatment of Bactrim.  2. Hypernatremia: resolved. [name] presents to the hospital with [name] 149. Etiology is unclear but could be related to dehydration in the setting of decreased intake and insensible losses. [name] has been given gentle fluid replacement and her [name] is 142 at the time of discharge. .  3. Acute renal failure: resolved.  This problem is likely pre- renal secondary to dehydration. After gentle fluid replacement,  her Cr is 1.05 at the time of discharge.  4. Hypertension: BP stable     We will resume her home regimen.  5. Leucocytosis; likely secondary to UTI versus stress de margination.    Pt has been treated with Ceftriaxone during this hospitalization and she will be discharged home with Bactrim.   Her PCP will followup CBC as out [name].    Discharge Vitals:  BP 149/77  Pulse 63  Temp(Src) 97.8 F (36.6 C) (Oral)  Resp 20  Ht 4\' 10"  (1.473 m)  Wt 138 lb 10.7 oz (62.9 kg)  BMI 28.98 kg/m2  SpO2 93%  Discharge Labs:  Results for orders placed during the hospital encounter of 11/03/11 (from the past 24 hour(s))  CBC     Status: Normal   Collection Time   11/04/11  5:19 PM      Component Value Range   WBC 7.8  4.0 - 10.5 (K/uL)   RBC 4.57  3.87 - 5.11 (MIL/uL)   Hemoglobin 12.6  12.0 - 15.0 (g/dL)   HCT 16.1  09.6 - 04.5 (%)   MCV 84.9  78.0 - 100.0 (fL)   MCH 27.6  26.0 - 34.0 (pg)   MCHC 32.5  30.0 - 36.0 (g/dL)   RDW 40.9  81.1 - 91.4 (%)   Platelets 171  150 - 400 (K/uL)  CBC     Status: Abnormal   Collection Time   11/05/11  1:24 AM      Component Value Range   WBC 10.1  4.0 - 10.5 (K/uL)   RBC 4.17  3.87 - 5.11 (MIL/uL)   Hemoglobin 11.2 (*) 12.0 - 15.0 (g/dL)   HCT 78.2 (*) 95.6 - 46.0 (%)   MCV 83.7  78.0 - 100.0 (fL)   MCH 26.9  26.0 - 34.0 (pg)   MCHC 32.1  30.0 - 36.0 (g/dL)   RDW 21.3  08.6 - 57.8 (%)   Platelets 154  150 - 400 (K/uL)  BASIC METABOLIC PANEL     Status: Abnormal   Collection Time   11/05/11  1:24 AM      Component Value Range   Sodium 142  135 - 145 (mEq/L)   Potassium 3.6  3.5 - 5.1 (mEq/L)   Chloride 111  96 - 112 (mEq/L)   CO2 23  19 - 32 (mEq/L)   Glucose, Bld 119 (*) 70 - 99 (mg/dL)   BUN 22  6 - 23 (mg/dL)   Creatinine, Ser 2.13  0.50 - 1.10 (mg/dL)   Calcium 8.6  8.4 - 08.6 (mg/dL)   GFR calc non Af Amer 49 (*) >90 (mL/min)   GFR calc Af Amer 57 (*) >90 (mL/min)  PROTIME-INR     Status: Abnormal   Collection Time   11/05/11  1:24  AM      Component Value Range   Prothrombin Time 16.3 (*) 11.6 - 15.2 (seconds)   INR 1.29  0.00 - 1.49   CBC     Status: Abnormal   Collection Time   11/05/11  9:10 AM      Component Value Range   WBC 12.4 (*) 4.0 - 10.5 (K/uL)   RBC 4.53  3.87 - 5.11 (MIL/uL)   Hemoglobin 12.3  12.0 - 15.0 (g/dL)   HCT 57.8  46.9 - 62.9 (%)   MCV 84.3  78.0 - 100.0 (fL)   MCH 27.2  26.0 - 34.0 (pg)   MCHC 32.2  30.0 - 36.0 (g/dL)   RDW 52.8  41.3 - 24.4 (%)   Platelets 204  150 - 400 (K/uL)    Signed: Sareena [name] 11/05/2011, 2:46 PM

## 2011-11-05 NOTE — Progress Notes (Signed)
Subjective: Patient feels ok today. alert and able to communicate with family and staff.  Patient is calm and pleasant. No c/o, no acute distress noted. Daughter is at bedside and questions answered  Objective: Vital signs in last 24 hours: Filed Vitals:   11/04/11 0710 11/04/11 0852 11/04/11 1521 11/05/11 0607  BP: 118/58 128/48 128/67 149/77  Pulse: 67 74 81 63  Temp: 97.3 F (36.3 C) 98.7 F (37.1 C) 100.2 F (37.9 C) 97.8 F (36.6 C)  TempSrc: Axillary Oral Oral Oral  Resp: 17 18 18 20   Height:      Weight:      SpO2:   96% 97%   Weight change:   Intake/Output Summary (Last 24 hours) at 11/05/11 4098 Last data filed at 11/05/11 0554  Gross per 24 hour  Intake   1090 ml  Output      0 ml  Net   1090 ml   Physical Exam: General: alert, calm and pleasant, well-developed, and somehow not completely cooperative to examination due to her dementia.  Head: normocephalic and atraumatic.  Eyes: vision grossly intact, pupils equal, pupils round, pupils reactive to light, no injection and anicteric.  Mouth: pharynx pink and moist, no erythema, and no exudates.  Neck: supple, full ROM, no thyromegaly, no JVD, and no carotid bruits.  Lungs: normal respiratory effort, no accessory muscle use, normal breath sounds, no crackles, and no wheezes. Heart: normal rate, regular rhythm, no murmur, no gallop, and no rub.  Abdomen: soft, non-tender, normal bowel sounds, no distention, no guarding, no rebound tenderness, no hepatomegaly, and no splenomegaly.  Msk: no joint swelling, no joint warmth, and no redness over joints.  Pulses: 2+ DP/PT pulses bilaterally Extremities: No cyanosis, clubbing, edema Neurologic: alert & oriented self and family, gross neuro exam is WNL. Unable to perform thorough neuro exam due to dementia.  Skin: turgor normal and no rashes.    Lab Results: Basic Metabolic Panel:  Lab 11/05/11 1191 11/04/11 0006  Lindzey Zent 142 151*  K 3.6 3.6  CL 111 116*  CO2 23 25    GLUCOSE 119* 75  BUN 22 32*  CREATININE 1.05 1.31*  CALCIUM 8.6 8.9  MG -- --  PHOS -- --   CBC:  Lab 11/05/11 0124 11/04/11 1719 11/03/11 1531  WBC 10.1 7.8 --  NEUTROABS -- -- 6.3  HGB 11.2* 12.6 --  HCT 34.9* 38.8 --  MCV 83.7 84.9 --  PLT 154 171 --   Coagulation:  Lab 11/05/11 0124 11/04/11 0823 11/04/11 0006 10/31/11 1502  LABPROT 16.3* 21.3* 39.1* --  INR 1.29 1.81* 3.94* 1.9   Urinalysis: Large leukocyte and negative nitrates.  Many Bacterias.  Studies/Results: Ct Head Wo Contrast  11/03/2011  *RADIOLOGY REPORT*  Clinical Data: Fatigue, sudden onset altered mental status  CT HEAD WITHOUT CONTRAST  Technique:  Contiguous axial images were obtained from the base of the skull through the vertex without contrast.  Comparison: 10/29/2011  Findings: Generalized atrophy. Stable prominence of the ventricular system. No midline shift or mass effect. Small vessel chronic ischemic changes of deep cerebral white matter. New small focus focus of high attenuation acute subarachnoid hemorrhage in a sulcus in the right frontal region. No evidence of acute infarction or mass. Minimal mucosal thickening in a few ethmoid air cells. Bones diffusely demineralized.  IMPRESSION: Atrophy with small vessel chronic ischemic changes of deep cerebral white matter. New small focus of acute subarachnoid hemorrhage in a right frontal sulcus.  Critical Value/emergent results were  called by telephone at the time of interpretation on 11/03/2011  at 1842 hrs  to  Grant Fontana, who verbally acknowledged these results.  Original Report Authenticated By: Lollie Marrow, M.D.   Dg Abd Acute W/chest  11/03/2011  *RADIOLOGY REPORT*  Clinical Data: Constipation for 1 week.  ACUTE ABDOMEN SERIES (ABDOMEN 2 VIEW & CHEST 1 VIEW)  Comparison: 07/20/2011 CT of the abdomen.  Findings: The bowel gas pattern is normal.  There is retained stool noted throughout the colon.  There is no evidence for free peritoneal air.   There is sclerotic density associated the right anterior sixth through eighth ribs most consistent with probable healing lower right anterior rib fractures.  There are surgical clips within the right upper quadrant from a previous cholecystectomy.  There is a left hip prosthesis present.  The upright chest demonstrates mild cardiomegaly.  There are no infiltrates or edematous changes.  There is no evidence for pneumothorax.  IMPRESSION:  1.  Moderate retained colonic stool. 2.  No evidence for bowel obstruction or free peritoneal air. 3.  Probable healing anterior right sixth through eighth rib fractures with callous formation.  Original Report Authenticated By: Rolla Plate, M.D.   Medications: I have reviewed the patient's current medications. Scheduled Meds:   . cefTRIAXone (ROCEPHIN)  IV  1 g Intravenous Q24H  . docusate sodium  100 mg Oral BID  . methylPREDNISolone (SOLU-MEDROL) injection  10 mg Intravenous Daily  . olopatadine  1 drop Both Eyes BID   Continuous Infusions:   . DISCONTD: sodium chloride 100 mL/hr at 11/05/11 0554   PRN Meds:.acetaminophen, bisacodyl, ondansetron (ZOFRAN) IV, ondansetron Assessment/Plan:  1. Altered mental status: Likely 2/2 UTI and subarachnoid hemorrhage in the setting of chronic coumadin for DVT and PE. This can be further complicated by electrolyte disturbances( hypernatremia) in the setting of dehydration.  Of note patient as underlying dementia at baseline and is usually not very interactive. Pt has vitamin K and FFP last night and her most recent INR is 1.29.  Patient is hemodynamically stable.  -Neurochecks q4.  -Speech evaluation is done and Dysphagia 3 diet ordered. IVF stopped. -Continue to hold coumadin and ASA in the setting of subarachnoid hemorrhage  -Hold home antihypertensives and memantine.   2. subarachnoid hemorrhage  -Symptomatic management  -Will not consult Neurosurgery as the patient is DNR/DNI and the family agrees with it.     3. UTI, confirmed with UA  -Treat UTI with ceftriaxone   2. Hypernatremia: Resolved. Most recent Chou Busler is 142. etiology unclear but could be related to dehydration in the setting of decreased intake and insensible losses.   .  3. Acute renal failure:  likely pre- renal secondary to dehydration. Cr 1.05 after the gentle IVF replacement.  4. Hypertension: BP stable  Plan: Hold home antihypertensive meds.   5. Leucocytosis; likely secondary to UTI versus stress de margination.  Plan: Treat UTI with ceftriaxone.  Continue to monitor.   6. DVT: SCD's    LOS: 2 days   Leslyn Monda 11/05/2011, 8:12 AM

## 2011-12-31 ENCOUNTER — Ambulatory Visit
Admission: RE | Admit: 2011-12-31 | Discharge: 2011-12-31 | Disposition: A | Discharge status: Home / Self Care | Payer: No Typology Code available for payment source | Source: Ambulatory Visit | Attending: Family Medicine | Admitting: Family Medicine

## 2011-12-31 ENCOUNTER — Other Ambulatory Visit: Payer: Self-pay | Admitting: Family Medicine

## 2011-12-31 DIAGNOSIS — R52 Pain, unspecified: Secondary | ICD-10-CM

## 2012-05-09 IMAGING — CR DG ABDOMEN 2V
3 series · 3 of 3 positions shown · non-contrast
Comparison: Abdominal radiograph performed 06/07/2011

CLINICAL DATA: Assess for additional foreign bodies; foreign body
excreted in stool.

ABDOMEN - 2 VIEW

[w abdomen decub (1 of 2)]
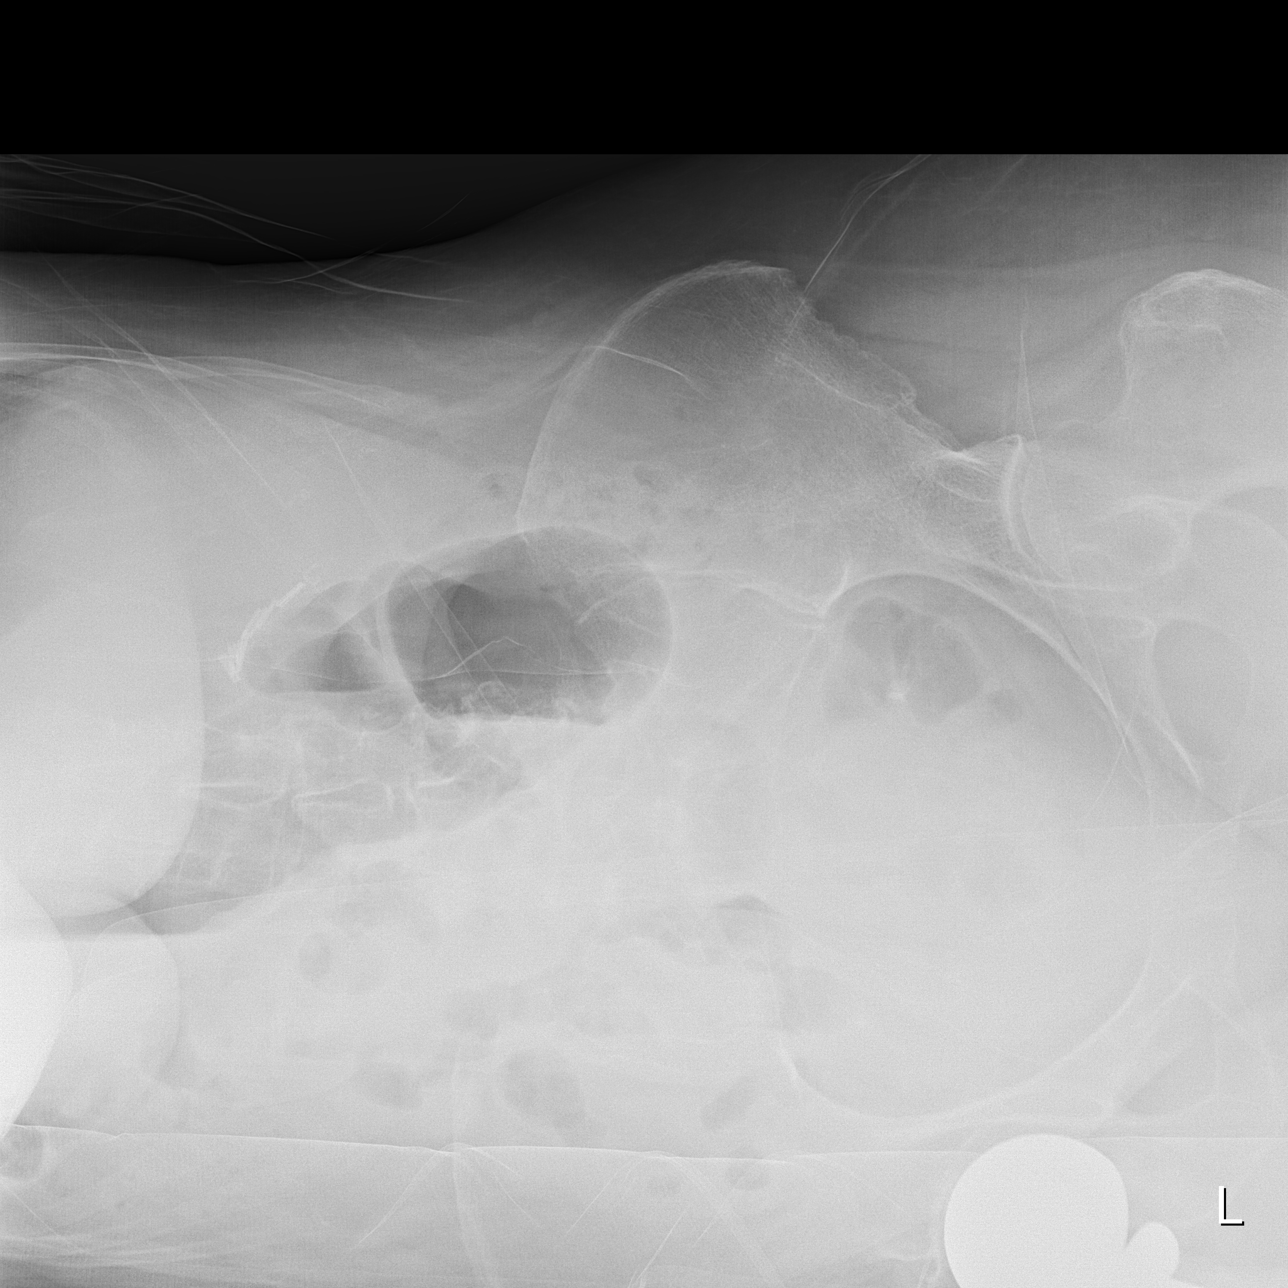

[w abdomen decub (2 of 2)]
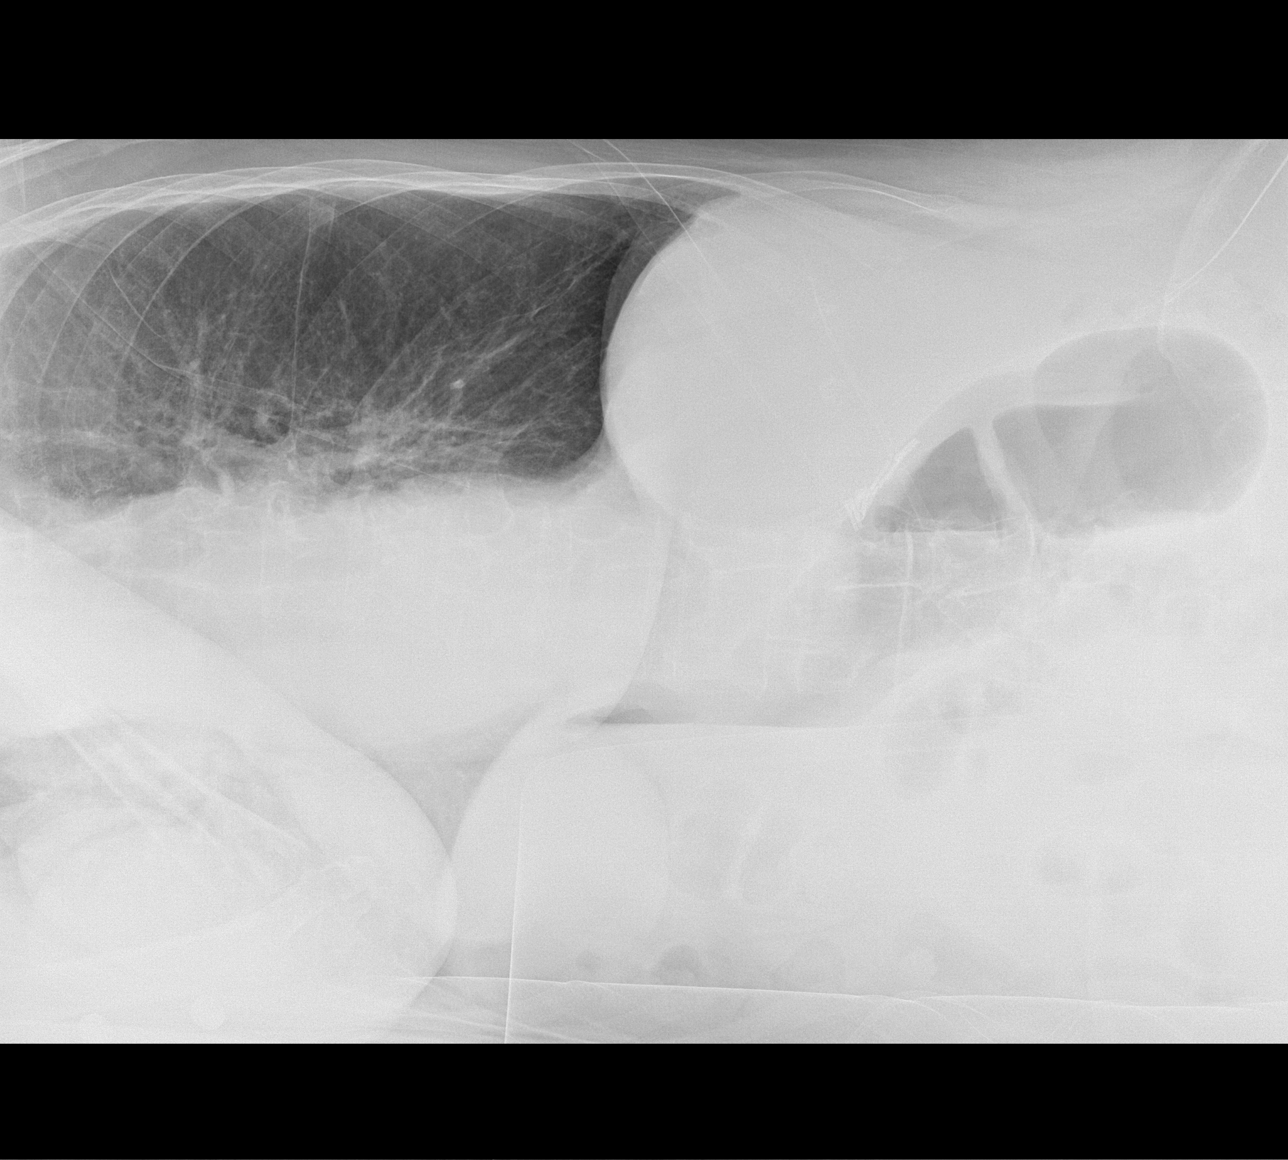

[x abdomen supine]
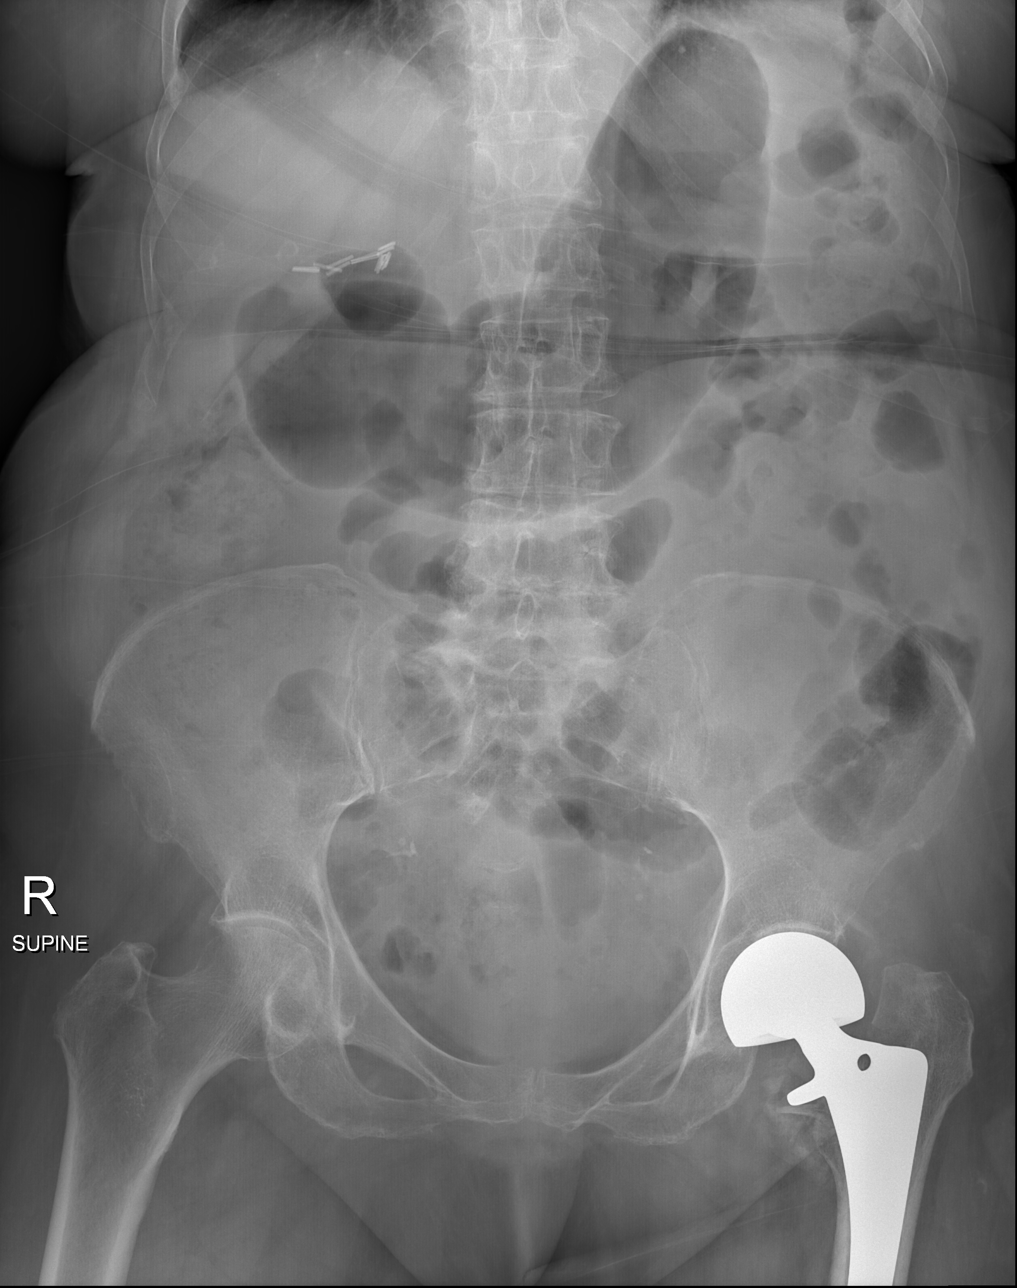

[3 of 3 positions shown; findings below may reference images not displayed]

FINDINGS: No radiopaque foreign bodies are identified within the
abdomen or pelvis.  The visualized bowel gas pattern is
unremarkable in appearance, with scattered air-filled small and
large bowel loops, and a moderate amount of air in the stomach.  No
free intra-abdominal air is seen on the provided decubitus views.

Clips are noted within the right upper quadrant, reflecting prior
cholecystectomy.  The visualized portions of the right lung appear
grossly clear.  Mild degenerative change is noted at the lower
lumbar spine.  There is diffuse osteopenia of visualized osseous
structures.  A left hip hemiarthroplasty is grossly unremarkable in
appearance, though the distal aspect of the hemiarthroplasty is not
imaged.
IMPRESSION: No radiopaque foreign bodies identified within the abdomen or
pelvis; no free intra-abdominal air seen.  Unremarkable bowel gas
pattern.

## 2012-07-24 ENCOUNTER — Emergency Department (HOSPITAL_COMMUNITY)
Admission: EM | Admit: 2012-07-24 | Discharge: 2012-07-25 | Disposition: A | Discharge status: Home / Self Care | Payer: Medicare (Managed Care) | Attending: Emergency Medicine | Admitting: Emergency Medicine

## 2012-07-24 ENCOUNTER — Emergency Department (HOSPITAL_COMMUNITY): Payer: Medicare (Managed Care)

## 2012-07-24 ENCOUNTER — Encounter (HOSPITAL_COMMUNITY): Payer: Self-pay | Admitting: *Deleted

## 2012-07-24 DIAGNOSIS — J45909 Unspecified asthma, uncomplicated: Secondary | ICD-10-CM | POA: Insufficient documentation

## 2012-07-24 DIAGNOSIS — Z9089 Acquired absence of other organs: Secondary | ICD-10-CM | POA: Insufficient documentation

## 2012-07-24 DIAGNOSIS — W1811XA Fall from or off toilet without subsequent striking against object, initial encounter: Secondary | ICD-10-CM | POA: Insufficient documentation

## 2012-07-24 DIAGNOSIS — M810 Age-related osteoporosis without current pathological fracture: Secondary | ICD-10-CM | POA: Insufficient documentation

## 2012-07-24 DIAGNOSIS — M25519 Pain in unspecified shoulder: Secondary | ICD-10-CM | POA: Insufficient documentation

## 2012-07-24 DIAGNOSIS — Z86718 Personal history of other venous thrombosis and embolism: Secondary | ICD-10-CM | POA: Insufficient documentation

## 2012-07-24 DIAGNOSIS — F0390 Unspecified dementia without behavioral disturbance: Secondary | ICD-10-CM | POA: Insufficient documentation

## 2012-07-24 DIAGNOSIS — K219 Gastro-esophageal reflux disease without esophagitis: Secondary | ICD-10-CM | POA: Insufficient documentation

## 2012-07-24 DIAGNOSIS — Z79899 Other long term (current) drug therapy: Secondary | ICD-10-CM | POA: Insufficient documentation

## 2012-07-24 DIAGNOSIS — Y92009 Unspecified place in unspecified non-institutional (private) residence as the place of occurrence of the external cause: Secondary | ICD-10-CM | POA: Insufficient documentation

## 2012-07-24 DIAGNOSIS — I10 Essential (primary) hypertension: Secondary | ICD-10-CM | POA: Insufficient documentation

## 2012-07-24 DIAGNOSIS — S2239XA Fracture of one rib, unspecified side, initial encounter for closed fracture: Secondary | ICD-10-CM

## 2012-07-24 DIAGNOSIS — S2249XA Multiple fractures of ribs, unspecified side, initial encounter for closed fracture: Secondary | ICD-10-CM | POA: Insufficient documentation

## 2012-07-24 MED ORDER — HYDROCODONE-ACETAMINOPHEN 5-325 MG PO TABS: 1.0000 | ORAL_TABLET | Freq: Four times a day (QID) | ORAL | Status: AC | PRN

## 2012-07-24 NOTE — ED Provider Notes (Signed)
History     CSN: [id number]  Arrival date & time 07/24/12  8657   First MD Initiated Contact with Patient 07/24/12 2036      Chief Complaint  Patient presents with  . Fall  . Abdominal Pain    HPI The patient was brought in by her daughter after having a fall last evening. The daughter was helping the patient use the commode. She turned around and the patient fell backwards. Since the fall she has been complaining of pain in the left flank abdomen area. She has not had any fevers, cough, vomiting, or diarrhea. She has not had any urinary difficulty. The daughter called her Dr. who suggested to see if it would resolve over the evening. Because of the persistent pain she brought her in today. The pain is only mild to moderate. Nothing seems to make it particularly worse Past Medical History  Diagnosis Date  . Asthma   . Hypertension   . Osteoporosis   . GERD (gastroesophageal reflux disease)   . Dementia   . Pulmonary embolism     hx of 4/09, sees Dr. [name]   . Leg edema   . DVT (deep venous thrombosis)     Past Surgical History  Procedure Date  . Cholecystectomy   . Lithotripsy   . Right knee surgery   . Hip fracture surgery     Family History  Problem Relation Age of Onset  . Arthritis Other   . Hypertension Other   . Cancer Other     lung    History  Substance Use Topics  . Smoking status: Never Smoker   . Smokeless tobacco: Never Used  . Alcohol Use: No    OB History    Grav Para Term Preterm Abortions TAB SAB Ect Mult Living                  Review of Systems  All other systems reviewed and are negative.    Allergies  Review of patient's allergies indicates no known allergies.  Home Medications   Current Outpatient Rx  Name Route Sig Dispense Refill  . ALBUTEROL SULFATE (2.5 MG/3ML) 0.083% IN NEBU Nebulization Take 2.5 mg by nebulization every 6 (six) hours as needed. For shortness of breath    . VITAMIN C 500 MG PO TABS Oral Take 500 mg by  mouth daily.      . AZELASTINE HCL 0.05 % OP SOLN Both Eyes Place 1 drop into both eyes 2 (two) times daily.      . AZITHROMYCIN 250 MG PO TABS Oral Take 250 mg by mouth daily.    . BUDESONIDE 0.5 MG/2ML IN SUSP Nebulization Take 0.5 mg by nebulization 2 (two) times daily as needed. wheezing    . CALCIUM CARBONATE ANTACID 500 MG PO CHEW Oral Chew 2 tablets by mouth daily.      Marland Kitchen VITAMIN D 2000 UNITS PO TABS Oral Take 2,000 Units by mouth daily.      Marland Kitchen DM-GUAIFENESIN ER 30-600 MG PO TB12 Oral Take 1 tablet by mouth every 12 (twelve) hours as needed. For Cough and wheeze    . ESOMEPRAZOLE MAGNESIUM 40 MG PO CPDR Oral Take 40 mg by mouth at bedtime.     Marland Kitchen FLUTICASONE PROPIONATE 50 MCG/ACT NA SUSP Nasal Place 2 sprays into the nose 2 (two) times daily as needed. For congestion and runny nose    . HYDROXYZINE HCL 25 MG PO TABS Oral Take 25 mg by mouth every  4 (four) hours as needed. For itch    . MEMANTINE HCL 10 MG PO TABS Oral Take 10 mg by mouth 2 (two) times daily.      Marland Kitchen METOPROLOL TARTRATE 25 MG PO TABS Oral Take 25 mg by mouth 2 (two) times daily.    Marland Kitchen MONTELUKAST SODIUM 10 MG PO TABS Oral Take 10 mg by mouth at bedtime.      Carma Leaven M PLUS PO TABS Oral Take 1 tablet by mouth daily.      Marland Kitchen PREDNISONE 10 MG PO TABS Oral Take 10 mg by mouth daily after breakfast. Or see bottom of medication calender for specific tapering instructions      BP 138/69  Pulse 52  Temp 98.7 F (37.1 C) (Oral)  Resp 24  Ht 4\' 11"  (1.499 m)  Wt 148 lb (67.132 kg)  BMI 29.89 kg/m2  SpO2 95%  Physical Exam  Nursing note and vitals reviewed. Constitutional: She appears well-developed and well-nourished. No distress.  HENT:  Head: Normocephalic and atraumatic.  Right Ear: External ear normal.  Left Ear: External ear normal.  Eyes: Conjunctivae are normal. Right eye exhibits no discharge. Left eye exhibits no discharge. No scleral icterus.  Neck: Neck supple. No tracheal deviation present.  Cardiovascular:  Normal rate, regular rhythm and intact distal pulses.   Pulmonary/Chest: Effort normal and breath sounds normal. No stridor. No respiratory distress. She has no wheezes. She has no rales. Tenderness: no significant tenderness left chest.  Abdominal: Soft. Bowel sounds are normal. She exhibits no distension. There is no tenderness. There is no rebound and no guarding.  Musculoskeletal: She exhibits no edema and no tenderness.       Full range of motion bilateral arms and legs without pain and discomfort  Neurological: She is alert. She has normal strength. No cranial nerve deficit ( no gross defecits noted) or sensory deficit. She exhibits normal muscle tone. She displays no seizure activity.       Patient moves all extremities  Skin: Skin is warm and dry. No rash noted.  Psychiatric: She has a normal mood and affect.    ED Course  Procedures (including critical care time)  Labs Reviewed - No data to display Dg Ribs Unilateral W/chest Left  07/24/2012  *RADIOLOGY REPORT*  Clinical Data: 78 year old female status post fall with left side pain.  LEFT RIBS AND CHEST - 3+ VIEW  Comparison: 07/16/2011 earlier.  Findings: Continued low lung volumes.  Stable cardiac size and mediastinal contours.  No pneumothorax or pleural effusion.  No confluent pulmonary opacity.  Osteopenia and large body habitus limits bone detail of the left ribs.  Nondisplaced left lateral seventh and eighth rib fractures are suspected.  Other visualized osseous structures appear intact.  IMPRESSION: 1.  Nondisplaced left lateral seventh and eighth rib fractures. 2. Low lung volumes, otherwise no acute cardiopulmonary abnormality.   Original Report Authenticated By: Harley Hallmark, M.D.    Dg Hip Complete Left  07/24/2012  *RADIOLOGY REPORT*  Clinical Data: 78 year old female status post fall with left side pain.  LEFT HIP - COMPLETE 2+ VIEW  Comparison: 10/29/2011.  Findings: Chronic left femoral arthroplasty changes appear  stable. Hardware appears stable and intact.  Extensive heterotopic ossification about the left hip has mildly progressed since 2012. Osteopenia.  Pelvis appears stable and intact.  Proximal right femur appears grossly stable.  IMPRESSION: Chronic postoperative changes at the left hip. No acute fracture or dislocation identified about the left hip or pelvis.  Original Report Authenticated By: Harley Hallmark, M.D.    Dg Shoulder Left  07/24/2012  *RADIOLOGY REPORT*  Clinical Data: 78 year old female status post fall with left side pain.  LEFT SHOULDER - 2+ VIEW  Comparison: None.  Findings: Osteopenia. No glenohumeral joint dislocation. Degenerative spurring at the left humeral head.  Proximal left humerus appears intact.  Left clavicle and scapula appear intact. Visualized left ribs and lung parenchyma appears stable with chronic pleural thickening at the level of the second rib.  IMPRESSION: No acute fracture or dislocation identified about the left shoulder.   Original Report Authenticated By: Harley Hallmark, M.D.      1. Fracture, rib       MDM  Patient appears to have seventh and eighth rib fracture is associated with her fall. She is in no distress and has been managing her pain with Tylenol at home. The patient's daughter is comfortable taking her home. She does want to make sure she didn't have any serious injuries but did suspect that she probably had a rib fracture.  At this time there does not appear to be any evidence of an acute emergency medical condition and the patient appears stable for discharge with appropriate outpatient follow up.         Celene Kras, MD 07/24/12 [phone]

## 2012-07-24 NOTE — ED Notes (Signed)
Pt fell yesterday evening. Pt presents w/ continued complaints of L side abdominal pain. No change in ambulation which is normally impaired.

## 2012-07-24 NOTE — ED Notes (Signed)
Patient transported to X-ray 

## 2012-08-06 ENCOUNTER — Ambulatory Visit
Admission: RE | Admit: 2012-08-06 | Discharge: 2012-08-06 | Disposition: A | Discharge status: Home / Self Care | Payer: No Typology Code available for payment source | Source: Ambulatory Visit | Attending: Family Medicine | Admitting: Family Medicine

## 2012-08-06 ENCOUNTER — Other Ambulatory Visit: Payer: Self-pay | Admitting: Family Medicine

## 2012-08-06 DIAGNOSIS — R52 Pain, unspecified: Secondary | ICD-10-CM

## 2012-08-06 DIAGNOSIS — W19XXXA Unspecified fall, initial encounter: Secondary | ICD-10-CM

## 2012-08-19 IMAGING — CR DG HIP (WITH OR WITHOUT PELVIS) 2-3V*L*
3 series · 3 of 3 positions shown · non-contrast
Comparison: 06/03/2011

CLINICAL DATA: Fall and left hip pain.

LEFT HIP - COMPLETE 2+ VIEW

[t pelvis ap]
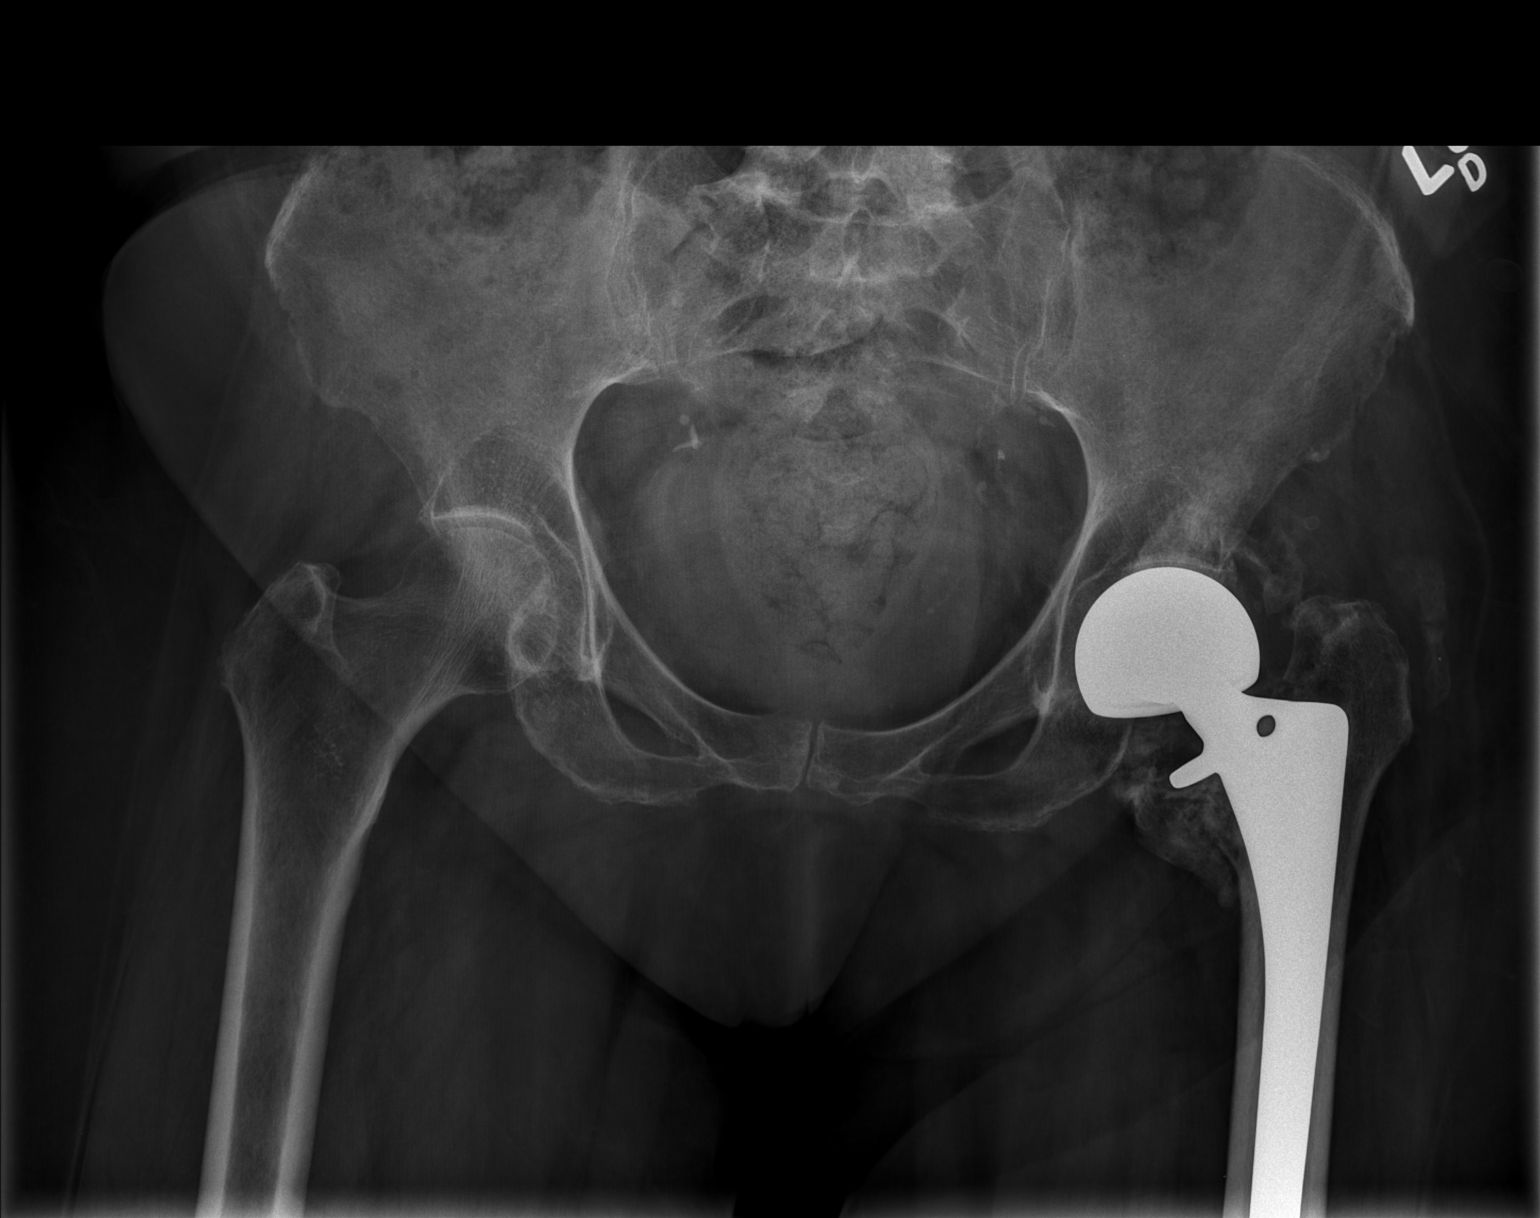

[t hip ap left]
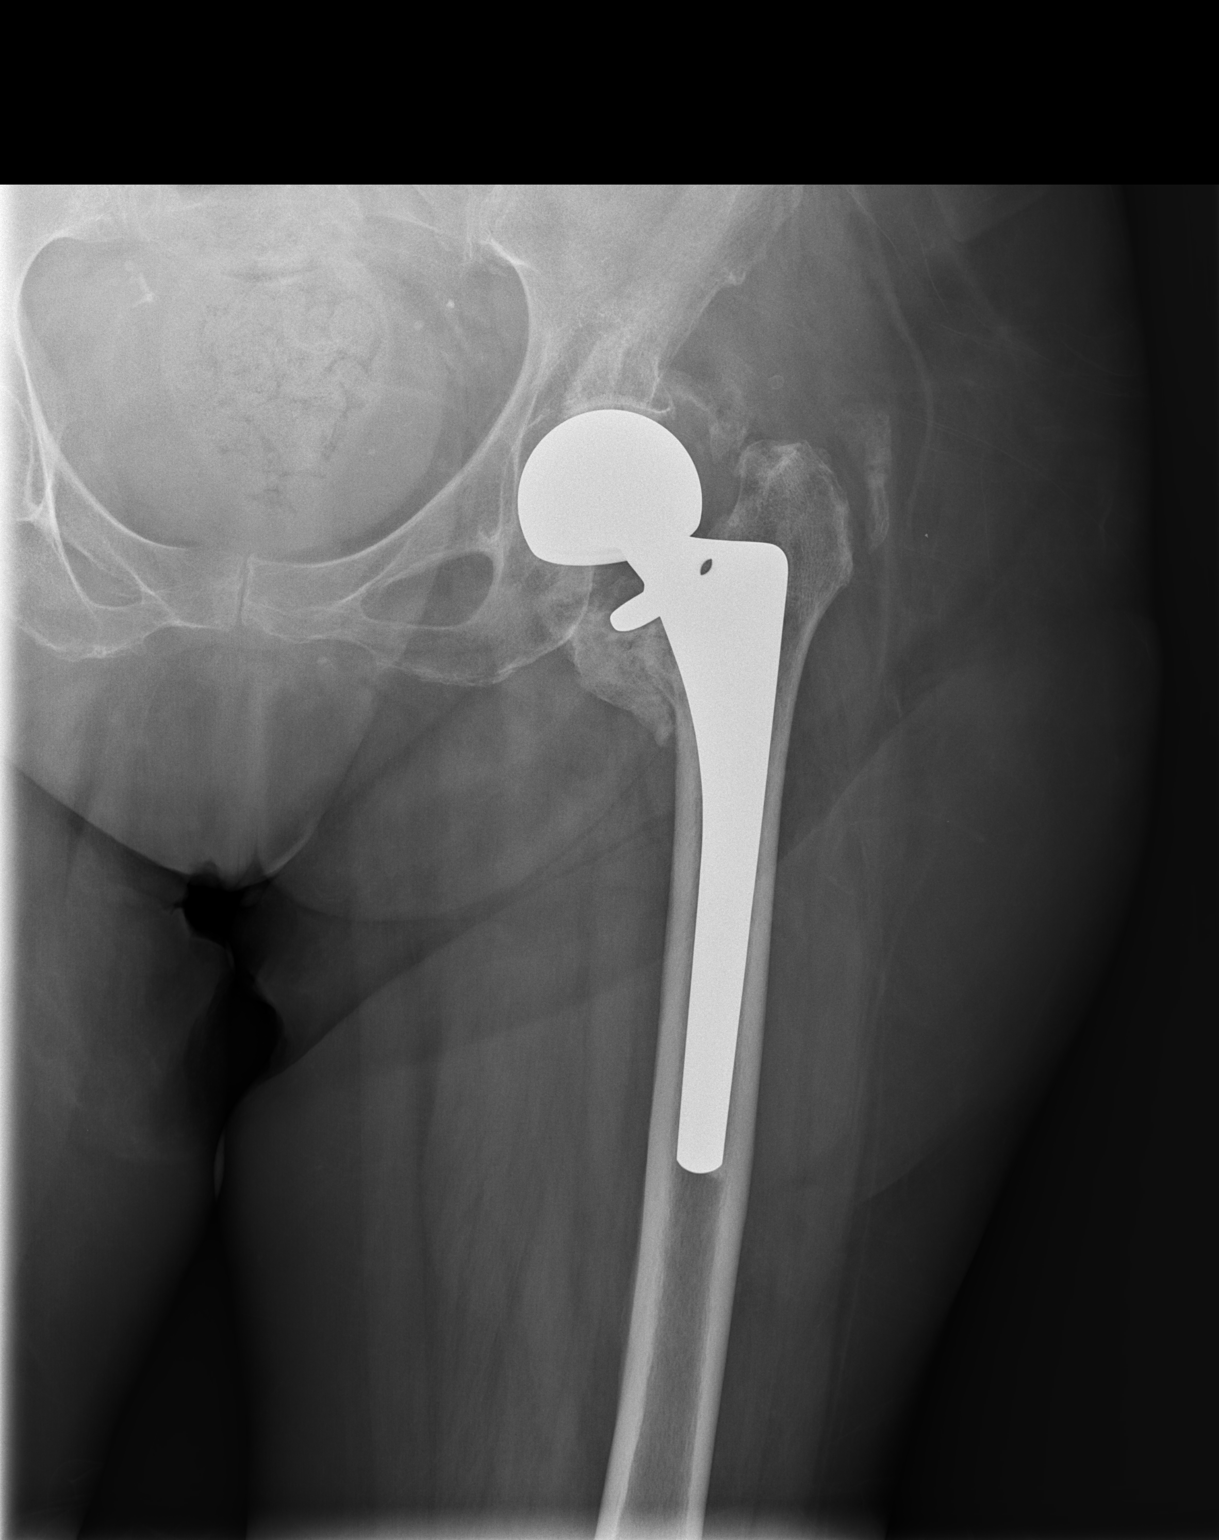

[w hip lat left]
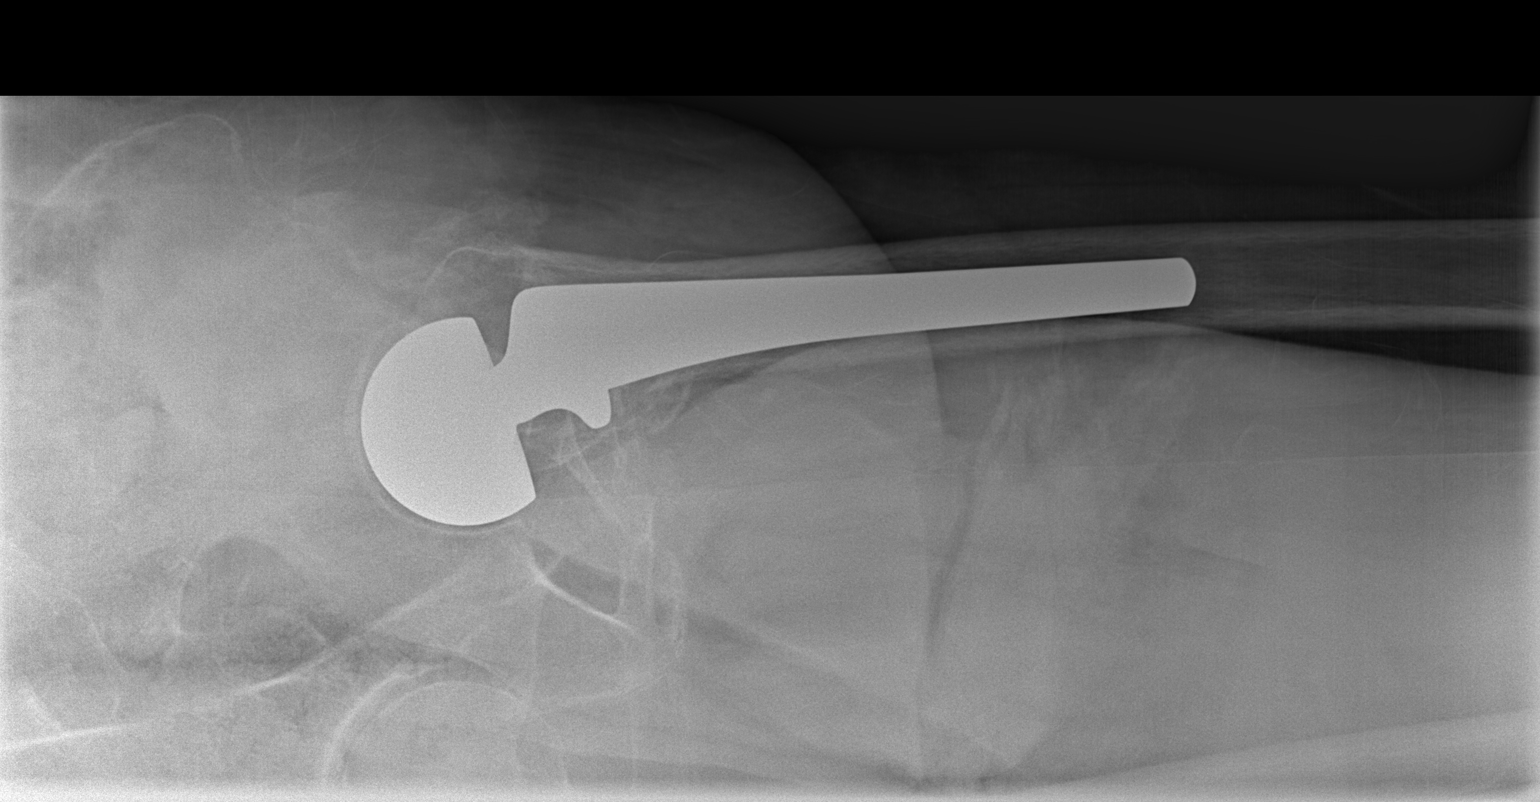

[3 of 3 positions shown; findings below may reference images not displayed]

FINDINGS: There is a left hip arthroplasty.  The left hip
arthroplasty is located.  No evidence for a periprosthetic
fracture.  There are heterotopic ossifications surrounding the left
hip. Large amount of stool in the lower abdomen and pelvis.  No
gross bony abnormality to the right hip.
IMPRESSION: Left hip arthroplasty.  No acute bony abnormality.

## 2012-11-12 ENCOUNTER — Other Ambulatory Visit: Payer: Self-pay | Admitting: Family Medicine

## 2012-11-12 ENCOUNTER — Ambulatory Visit
Admission: RE | Admit: 2012-11-12 | Discharge: 2012-11-12 | Disposition: A | Discharge status: Home / Self Care | Payer: PRIVATE HEALTH INSURANCE | Source: Ambulatory Visit | Attending: Family Medicine | Admitting: Family Medicine

## 2012-11-12 DIAGNOSIS — R52 Pain, unspecified: Secondary | ICD-10-CM

## 2012-11-18 ENCOUNTER — Ambulatory Visit
Admission: RE | Admit: 2012-11-18 | Discharge: 2012-11-18 | Disposition: A | Discharge status: Home / Self Care | Payer: No Typology Code available for payment source | Source: Ambulatory Visit | Attending: *Deleted | Admitting: *Deleted

## 2012-11-18 ENCOUNTER — Other Ambulatory Visit: Payer: Self-pay | Admitting: *Deleted

## 2012-11-18 DIAGNOSIS — W19XXXA Unspecified fall, initial encounter: Secondary | ICD-10-CM

## 2013-06-08 ENCOUNTER — Ambulatory Visit (INDEPENDENT_AMBULATORY_CARE_PROVIDER_SITE_OTHER): Payer: Medicare Other | Admitting: General Practice

## 2013-12-06 ENCOUNTER — Emergency Department (HOSPITAL_COMMUNITY): Payer: Medicare (Managed Care)

## 2013-12-06 ENCOUNTER — Encounter (HOSPITAL_COMMUNITY): Payer: Self-pay | Admitting: Emergency Medicine

## 2013-12-06 ENCOUNTER — Emergency Department (HOSPITAL_COMMUNITY)
Admission: EM | Admit: 2013-12-06 | Discharge: 2013-12-06 | Disposition: A | Discharge status: Home / Self Care | Payer: Medicare (Managed Care) | Attending: Emergency Medicine | Admitting: Emergency Medicine

## 2013-12-06 DIAGNOSIS — Z79899 Other long term (current) drug therapy: Secondary | ICD-10-CM | POA: Insufficient documentation

## 2013-12-06 DIAGNOSIS — Z86718 Personal history of other venous thrombosis and embolism: Secondary | ICD-10-CM | POA: Insufficient documentation

## 2013-12-06 DIAGNOSIS — Y92009 Unspecified place in unspecified non-institutional (private) residence as the place of occurrence of the external cause: Secondary | ICD-10-CM | POA: Insufficient documentation

## 2013-12-06 DIAGNOSIS — M810 Age-related osteoporosis without current pathological fracture: Secondary | ICD-10-CM | POA: Insufficient documentation

## 2013-12-06 DIAGNOSIS — R609 Edema, unspecified: Secondary | ICD-10-CM | POA: Insufficient documentation

## 2013-12-06 DIAGNOSIS — I10 Essential (primary) hypertension: Secondary | ICD-10-CM | POA: Insufficient documentation

## 2013-12-06 DIAGNOSIS — W19XXXA Unspecified fall, initial encounter: Secondary | ICD-10-CM

## 2013-12-06 DIAGNOSIS — K219 Gastro-esophageal reflux disease without esophagitis: Secondary | ICD-10-CM | POA: Insufficient documentation

## 2013-12-06 DIAGNOSIS — Y9389 Activity, other specified: Secondary | ICD-10-CM | POA: Insufficient documentation

## 2013-12-06 DIAGNOSIS — Z96649 Presence of unspecified artificial hip joint: Secondary | ICD-10-CM | POA: Insufficient documentation

## 2013-12-06 DIAGNOSIS — S7000XA Contusion of unspecified hip, initial encounter: Secondary | ICD-10-CM | POA: Insufficient documentation

## 2013-12-06 DIAGNOSIS — J45909 Unspecified asthma, uncomplicated: Secondary | ICD-10-CM | POA: Insufficient documentation

## 2013-12-06 DIAGNOSIS — IMO0002 Reserved for concepts with insufficient information to code with codable children: Secondary | ICD-10-CM | POA: Insufficient documentation

## 2013-12-06 DIAGNOSIS — W010XXA Fall on same level from slipping, tripping and stumbling without subsequent striking against object, initial encounter: Secondary | ICD-10-CM | POA: Insufficient documentation

## 2013-12-06 DIAGNOSIS — F0390 Unspecified dementia without behavioral disturbance: Secondary | ICD-10-CM | POA: Insufficient documentation

## 2013-12-06 DIAGNOSIS — Z86711 Personal history of pulmonary embolism: Secondary | ICD-10-CM | POA: Insufficient documentation

## 2013-12-06 DIAGNOSIS — S7002XA Contusion of left hip, initial encounter: Secondary | ICD-10-CM

## 2013-12-06 LAB — POCT I-STAT, CHEM 8
BUN: 16 mg/dL (ref 6–23)
CREATININE: 1.2 mg/dL — AB (ref 0.50–1.10)
Calcium, Ion: 1.23 mmol/L (ref 1.13–1.30)
Chloride: 113 mEq/L — ABNORMAL HIGH (ref 96–112)
GLUCOSE: 99 mg/dL (ref 70–99)
HCT: 51 % — ABNORMAL HIGH (ref 36.0–46.0)
HEMOGLOBIN: 17.3 g/dL — AB (ref 12.0–15.0)
POTASSIUM: 3.6 meq/L — AB (ref 3.7–5.3)
Sodium: 150 mEq/L — ABNORMAL HIGH (ref 137–147)
TCO2: 26 mmol/L (ref 0–100)

## 2013-12-06 LAB — URINALYSIS, ROUTINE W REFLEX MICROSCOPIC
BILIRUBIN URINE: NEGATIVE
GLUCOSE, UA: NEGATIVE mg/dL
Ketones, ur: NEGATIVE mg/dL
Nitrite: NEGATIVE
Protein, ur: NEGATIVE mg/dL
SPECIFIC GRAVITY, URINE: 1.015 (ref 1.005–1.030)
UROBILINOGEN UA: 1 mg/dL (ref 0.0–1.0)
pH: 7 (ref 5.0–8.0)

## 2013-12-06 LAB — URINE MICROSCOPIC-ADD ON

## 2013-12-06 MED ORDER — ACETAMINOPHEN 500 MG PO TABS
500.0000 mg | ORAL_TABLET | Freq: Once | ORAL | Status: AC
  Administered 2013-12-06: 500 mg via ORAL
  Filled 2013-12-06: qty 1

## 2013-12-06 NOTE — ED Provider Notes (Signed)
CSN: [id number]     Arrival date & time 12/06/13  1503 History   First MD Initiated Contact with Patient 12/06/13 1516     Chief Complaint  Patient presents with  . Fall   (Consider location/radiation/quality/duration/timing/severity/associated sxs/prior Treatment) HPI Comments: Level 5 caveat for dementia  Pt is a 80 y.o. female with Pmhx as above who presents with mechanifal fall while standing today with concern by daughter for hip injury. L leg gave out causing her to fall straight back.  Daughter does not believe she hit her head.  No LOC.  At baseline mental status.  No recent illness.   Patient is a 80 y.o. female presenting with fall. The history is provided by the patient and a relative. No language interpreter was used.  Fall This is a recurrent problem. The current episode started 3 to 5 hours ago. The problem occurs rarely. The problem has not changed since onset.Pertinent negatives include no chest pain, no abdominal pain, no headaches and no shortness of breath. The symptoms are aggravated by standing. The symptoms are relieved by lying down. She has tried rest for the symptoms. The treatment provided mild relief.    Past Medical History  Diagnosis Date  . Asthma   . Hypertension   . Osteoporosis   . GERD (gastroesophageal reflux disease)   . Dementia   . Pulmonary embolism     hx of 4/09, sees Dr. [name]   . Leg edema   . DVT (deep venous thrombosis)    Past Surgical History  Procedure Laterality Date  . Cholecystectomy    . Lithotripsy    . Right knee surgery    . Hip fracture surgery     Family History  Problem Relation Age of Onset  . Arthritis Other   . Hypertension Other   . Cancer Other     lung   History  Substance Use Topics  . Smoking status: Never Smoker   . Smokeless tobacco: Never Used  . Alcohol Use: No   OB History   Grav Para Term Preterm Abortions TAB SAB Ect Mult Living                 Review of Systems  Constitutional: Negative for  fever, chills, diaphoresis, activity change, appetite change and fatigue.  HENT: Negative for congestion, facial swelling, rhinorrhea and sore throat.   Eyes: Negative for photophobia and discharge.  Respiratory: Negative for cough, chest tightness and shortness of breath.   Cardiovascular: Negative for chest pain, palpitations and leg swelling.  Gastrointestinal: Negative for nausea, vomiting, abdominal pain and diarrhea.  Endocrine: Negative for polydipsia and polyuria.  Genitourinary: Negative for dysuria, frequency, difficulty urinating and pelvic pain.  Musculoskeletal: Negative for arthralgias, back pain, neck pain and neck stiffness.  Skin: Negative for color change and wound.  Allergic/Immunologic: Negative for immunocompromised state.  Neurological: Negative for facial asymmetry, weakness, numbness and headaches.  Hematological: Does not bruise/bleed easily.  Psychiatric/Behavioral: Negative for confusion and agitation.    Allergies  Review of patient's allergies indicates no known allergies.  Home Medications   Current Outpatient Rx  Name  Route  Sig  Dispense  Refill  . acetaminophen (TYLENOL) 500 MG tablet   Oral   Take 500 mg by mouth every 8 (eight) hours as needed for moderate pain or headache.         . albuterol (PROVENTIL) (2.5 MG/3ML) 0.083% nebulizer solution   Nebulization   Take 2.5 mg by nebulization every 6 (  six) hours as needed. For shortness of breath         . Ascorbic Acid (VITAMIN C) 500 MG tablet   Oral   Take 500 mg by mouth daily.           Marland Kitchen azelastine (OPTIVAR) 0.05 % ophthalmic solution   Both Eyes   Place 1 drop into both eyes 2 (two) times daily.           . budesonide (PULMICORT) 0.5 MG/2ML nebulizer solution   Nebulization   Take 0.5 mg by nebulization 2 (two) times daily as needed. wheezing         . calcium carbonate (TUMS - DOSED IN MG ELEMENTAL CALCIUM) 500 MG chewable tablet   Oral   Chew 2 tablets by mouth daily.            . Cholecalciferol (VITAMIN D) 2000 UNITS tablet   Oral   Take 2,000 Units by mouth daily.           Marland Kitchen dextromethorphan-guaiFENesin (MUCINEX DM) 30-600 MG per 12 hr tablet   Oral   Take 1 tablet by mouth every 12 (twelve) hours as needed. For Cough and wheeze         . Dextromethorphan-Guaifenesin (Q-TUSSIN DM) 10-100 MG/5ML liquid   Oral   Take 10 mLs by mouth every 6 (six) hours as needed (for cough/ congestion).         . fluticasone (FLONASE) 50 MCG/ACT nasal spray   Nasal   Place 2 sprays into the nose 2 (two) times daily as needed. For congestion and runny nose         . hydrOXYzine (ATARAX) 25 MG tablet   Oral   Take 25 mg by mouth every 4 (four) hours as needed. For itch         . magnesium hydroxide (MILK OF MAGNESIA) 400 MG/5ML suspension   Oral   Take 15 mLs by mouth daily as needed for mild constipation.         . memantine (NAMENDA) 10 MG tablet   Oral   Take 10 mg by mouth 2 (two) times daily.           . metoprolol tartrate (LOPRESSOR) 25 MG tablet   Oral   Take 25 mg by mouth 2 (two) times daily.         . montelukast (SINGULAIR) 10 MG tablet   Oral   Take 10 mg by mouth at bedtime.           . Multiple Vitamins-Minerals (MULTIVITAMINS THER. W/MINERALS) TABS   Oral   Take 1 tablet by mouth daily.           . predniSONE (DELTASONE) 5 MG tablet   Oral   Take 7.5 mg by mouth daily with breakfast.          BP 144/74  Pulse 60  Temp(Src) 98.2 F (36.8 C) (Oral)  Resp 18  SpO2 100% Physical Exam  Constitutional: She is oriented to person, place, and time. She appears well-developed and well-nourished. No distress.  HENT:  Head: Normocephalic and atraumatic.  Mouth/Throat: No oropharyngeal exudate.  Eyes: Pupils are equal, round, and reactive to light.  Neck: Normal range of motion. Neck supple.  Cardiovascular: Normal rate, regular rhythm and normal heart sounds.  Exam reveals no gallop and no friction rub.   No murmur  heard. Pulmonary/Chest: Effort normal and breath sounds normal. No respiratory distress. She has no wheezes. She has no  rales.  Abdominal: Soft. Bowel sounds are normal. She exhibits no distension and no mass. There is no tenderness. There is no rebound and no guarding.  Musculoskeletal: Normal range of motion. She exhibits no edema and no tenderness.  Grimaces w/ ROM of BL hips, but no grimacing w/ ttp over extremities, chest, head, neck, abdomen.   Neurological: She is alert and oriented to person, place, and time. GCS eye subscore is 4. GCS verbal subscore is 3. GCS motor subscore is 6.  Skin: Skin is warm and dry.  Psychiatric: She has a normal mood and affect.    ED Course  Procedures (including critical care time) Labs Review Labs Reviewed  URINALYSIS, ROUTINE W REFLEX MICROSCOPIC - Abnormal; Notable for the following:    APPearance CLOUDY (*)    Hgb urine dipstick MODERATE (*)    Leukocytes, UA MODERATE (*)    All other components within normal limits  URINE MICROSCOPIC-ADD ON - Abnormal; Notable for the following:    Bacteria, UA MANY (*)    All other components within normal limits  POCT I-STAT, CHEM 8 - Abnormal; Notable for the following:    Sodium 150 (*)    Potassium 3.6 (*)    Chloride 113 (*)    Creatinine, Ser 1.20 (*)    Hemoglobin 17.3 (*)    HCT 51.0 (*)    All other components within normal limits  URINE CULTURE   Imaging Review Dg Pelvis 1-2 Views  12/06/2013   CLINICAL DATA:  Fall from standing position. Left hip replacement 2 years ago.  EXAM: PELVIS - 1-2 VIEW  COMPARISON:  Acute abdominal series 11/03/2011. Pelvic CT 07/20/2011. Left hip radiographs 11/18/2012  FINDINGS: The bones are demineralized. There is no evidence of acute fracture or dislocation. Patient is status post left hip hemiarthroplasty. Moderate heterotopic ossification surrounding the left hip joint is similar to the prior radiographs. No hardware loosening is identified.  IMPRESSION: No  evidence of acute pelvic fracture. The left hip hemiarthroplasty has a stable appearance with surrounding heterotopic calcification.   Electronically Signed   By: Roxy HorsemanBill  Veazey M.D.   On: 12/06/2013 16:36   Dg Femur Left  12/06/2013   CLINICAL DATA:  Fall, tenderness  EXAM: LEFT FEMUR - 2 VIEW  COMPARISON:  11/18/2012  FINDINGS: Osseous demineralization.  Components of left hip prosthesis in expected positions.  Scattered calcification in soft tissues at the left hip region.  Visualized pelvis appears intact.  No acute fracture, dislocation, or bone destruction.  Small patellar spur at quadriceps tendon insertion.  IMPRESSION: Left hip prosthesis.  Osseous demineralization.  No acute abnormalities.   Electronically Signed   By: Ulyses SouthwardMark  Boles M.D.   On: 12/06/2013 19:43   Dg Tibia/fibula Left  12/06/2013   CLINICAL DATA:  Fall  EXAM: LEFT TIBIA AND FIBULA - 2 VIEW  COMPARISON:  None  FINDINGS: Diffuse osseous demineralization.  Knee and ankle joint alignments normal.  No acute fracture, dislocation, or bone destruction.  IMPRESSION: No acute osseous abnormalities.   Electronically Signed   By: Ulyses SouthwardMark  Boles M.D.   On: 12/06/2013 19:44   Ct Hip Left Wo Contrast  12/06/2013   CLINICAL DATA:  Larey SeatFell.  Left hip pain.  EXAM: CT OF THE LEFT HIP WITHOUT CONTRAST  TECHNIQUE: Multidetector CT imaging was performed according to the standard protocol. Multiplanar CT image reconstructions were also generated.  COMPARISON:  Radiographs 12/06/2013  FINDINGS: The bipolar hip prosthesis is intact. No dislocation or periprosthetic fracture. Extensive heterotopic  ossification noted. The pubic symphysis and visualized pelvis is intact.  IMPRESSION: No dislocation or periprosthetic fracture.  Moderate heterotopic ossification.   Electronically Signed   By: Loralie Champagne M.D.   On: 12/06/2013 18:10    EKG Interpretation   None       MDM   1. Fall from standing, initial encounter   2. Contusion of left hip, initial encounter     Pt is a 80 y.o. female with Pmhx as above who presents with mechanifal fall while standing today with concern by daughter for hip injury. L leg gave out causing her to fall straight back.  Daughter does not believe she hit her head.  No LOC.  At baseline mental status.  No recent illness. On PE, VSS, pt in NAD. She is non-verbal at baseline, denies complaints.  She has no signs of external trauma.  Cardiopulm & ab exam benign. She winces w/ mvmt of hip bilaterally.  XR hips ordered, were negative, but pt had limp w/ ambulation, seemed to be dragging L foot.  She continues to grimace only w/ ROM of L hip, no pain w/ ROm of knee, foot, ankle.  CT L hip added as well as XR femur and Tib/fib.  Urine not definitively infected. I feel she is safe for f/u in [facility] tomorrow.  Return precautions given for new or worsening symptoms including worsening pain, fever, vom, d/a, ab pain.          Shanna Cisco, MD 12/06/13 [phone]

## 2013-12-06 NOTE — ED Notes (Signed)
Called [facility] for transport.  

## 2013-12-06 NOTE — ED Notes (Signed)
Per EMS pt coming from  home with c/o fall, pt is non verbal at baseline and has no complaints. EMS reports pt had left hip fx 2 years ago and daughter wants her to be evaluated for injury on both hips.

## 2013-12-06 NOTE — Discharge Instructions (Signed)
Contusion °A contusion is a deep bruise. Contusions are the result of an injury that caused bleeding under the skin. The contusion may turn blue, purple, or yellow. Minor injuries will give you a painless contusion, but more severe contusions may stay painful and swollen for a few weeks.  °CAUSES  °A contusion is usually caused by a blow, trauma, or direct force to an area of the body. °SYMPTOMS  °· Swelling and redness of the injured area. °· Bruising of the injured area. °· Tenderness and soreness of the injured area. °· Pain. °DIAGNOSIS  °The diagnosis can be made by taking a history and physical exam. An X-ray, CT scan, or MRI may be needed to determine if there were any associated injuries, such as fractures. °TREATMENT  °Specific treatment will depend on what area of the body was injured. In general, the best treatment for a contusion is resting, icing, elevating, and applying cold compresses to the injured area. Over-the-counter medicines may also be recommended for pain control. Ask your caregiver what the best treatment is for your contusion. °HOME CARE INSTRUCTIONS  °· Put ice on the injured area. °· Put ice in a plastic bag. °· Place a towel between your skin and the bag. °· Leave the ice on for 15-20 minutes, 03-04 times a day. °· Only take over-the-counter or prescription medicines for pain, discomfort, or fever as directed by your caregiver. Your caregiver may recommend avoiding anti-inflammatory medicines (aspirin, ibuprofen, and naproxen) for 48 hours because these medicines may increase bruising. °· Rest the injured area. °· If possible, elevate the injured area to reduce swelling. °SEEK IMMEDIATE MEDICAL CARE IF:  °· You have increased bruising or swelling. °· You have pain that is getting worse. °· Your swelling or pain is not relieved with medicines. °MAKE SURE YOU:  °· Understand these instructions. °· Will watch your condition. °· Will get help right away if you are not doing well or get  worse. °Document Released: 08/27/2005 Document Revised: 02/09/2012 Document Reviewed: 09/22/2011 °[employer]® Patient Information ©2014 [employer]. ° °Fall Prevention and Home Safety °Falls cause injuries and can affect all age groups. It is possible to use preventive measures to significantly decrease the likelihood of falls. There are many simple measures which can make your home safer and prevent falls. °OUTDOORS °· Repair cracks and edges of walkways and driveways. °· Remove high doorway thresholds. °· Trim shrubbery on the main path into your home. °· Have good outside lighting. °· Clear walkways of tools, rocks, debris, and clutter. °· Check that handrails are not broken and are securely fastened. Both sides of steps should have handrails. °· Have leaves, snow, and ice cleared regularly. °· Use sand or salt on walkways during winter months. °· In the garage, clean up grease or oil spills. °BATHROOM °· Install night lights. °· Install grab bars by the toilet and in the tub and shower. °· Use non-skid mats or decals in the tub or shower. °· Place a plastic non-slip stool in the shower to sit on, if needed. °· Keep floors dry and clean up all water on the floor immediately. °· Remove soap buildup in the tub or shower on a regular basis. °· Secure bath mats with non-slip, double-sided rug tape. °· Remove throw rugs and tripping hazards from the floors. °BEDROOMS °· Install night lights. °· Make sure a bedside light is easy to reach. °· Do not use oversized bedding. °· Keep a telephone by your bedside. °· Have a firm chair   with side arms to use for getting dressed. °· Remove throw rugs and tripping hazards from the floor. °KITCHEN °· Keep handles on pots and pans turned toward the center of the stove. Use back burners when possible. °· Clean up spills quickly and allow time for drying. °· Avoid walking on wet floors. °· Avoid hot utensils and knives. °· Position shelves so they are not too high or low. °· Place  commonly used objects within easy reach. °· If necessary, use a sturdy step stool with a grab bar when reaching. °· Keep electrical cables out of the way. °· Do not use floor polish or wax that makes floors slippery. If you must use wax, use non-skid floor wax. °· Remove throw rugs and tripping hazards from the floor. °STAIRWAYS °· Never leave objects on stairs. °· Place handrails on both sides of stairways and use them. Fix any loose handrails. Make sure handrails on both sides of the stairways are as long as the stairs. °· Check carpeting to make sure it is firmly attached along stairs. Make repairs to worn or loose carpet promptly. °· Avoid placing throw rugs at the top or bottom of stairways, or properly secure the rug with carpet tape to prevent slippage. Get rid of throw rugs, if possible. °· Have an electrician put in a light switch at the top and bottom of the stairs. °OTHER FALL PREVENTION TIPS °· Wear low-heel or rubber-soled shoes that are supportive and fit well. Wear closed toe shoes. °· When using a stepladder, make sure it is fully opened and both spreaders are firmly locked. Do not climb a closed stepladder. °· Add color or contrast paint or tape to grab bars and handrails in your home. Place contrasting color strips on first and last steps. °· Learn and use mobility aids as needed. Install an electrical emergency response system. °· Turn on lights to avoid dark areas. Replace light bulbs that burn out immediately. Get light switches that glow. °· Arrange furniture to create clear pathways. Keep furniture in the same place. °· Firmly attach carpet with non-skid or double-sided tape. °· Eliminate uneven floor surfaces. °· Select a carpet pattern that does not visually hide the edge of steps. °· Be aware of all pets. °OTHER HOME SAFETY TIPS °· Set the water temperature for 120° F (48.8° C). °· Keep emergency numbers on or near the telephone. °· Keep smoke detectors on every level of the home and near  sleeping areas. °Document Released: 11/07/2002 Document Revised: 05/18/2012 Document Reviewed: 02/06/2012 °Exit[employer]® Patient Information ©2014 [employer]. ° °

## 2013-12-07 LAB — URINE CULTURE
COLONY COUNT: NO GROWTH
CULTURE: NO GROWTH

## 2014-09-19 ENCOUNTER — Inpatient Hospital Stay (HOSPITAL_COMMUNITY)
Admission: EM | Admit: 2014-09-19 | Discharge: 2014-09-29 | DRG: 871 | Disposition: A | Discharge status: Home Health | Payer: Medicare (Managed Care) | Attending: Internal Medicine | Admitting: Internal Medicine

## 2014-09-19 ENCOUNTER — Encounter (HOSPITAL_COMMUNITY): Payer: Self-pay | Admitting: Emergency Medicine

## 2014-09-19 ENCOUNTER — Emergency Department (HOSPITAL_COMMUNITY): Payer: Medicare (Managed Care)

## 2014-09-19 DIAGNOSIS — F0390 Unspecified dementia without behavioral disturbance: Secondary | ICD-10-CM | POA: Diagnosis present

## 2014-09-19 DIAGNOSIS — R34 Anuria and oliguria: Secondary | ICD-10-CM | POA: Diagnosis present

## 2014-09-19 DIAGNOSIS — R4182 Altered mental status, unspecified: Secondary | ICD-10-CM | POA: Diagnosis present

## 2014-09-19 DIAGNOSIS — J690 Pneumonitis due to inhalation of food and vomit: Secondary | ICD-10-CM | POA: Diagnosis present

## 2014-09-19 DIAGNOSIS — E872 Acidosis: Secondary | ICD-10-CM | POA: Diagnosis present

## 2014-09-19 DIAGNOSIS — R0602 Shortness of breath: Secondary | ICD-10-CM

## 2014-09-19 DIAGNOSIS — Z79899 Other long term (current) drug therapy: Secondary | ICD-10-CM

## 2014-09-19 DIAGNOSIS — N10 Acute tubulo-interstitial nephritis: Secondary | ICD-10-CM

## 2014-09-19 DIAGNOSIS — Z86711 Personal history of pulmonary embolism: Secondary | ICD-10-CM | POA: Diagnosis not present

## 2014-09-19 DIAGNOSIS — Z86718 Personal history of other venous thrombosis and embolism: Secondary | ICD-10-CM

## 2014-09-19 DIAGNOSIS — N390 Urinary tract infection, site not specified: Secondary | ICD-10-CM | POA: Diagnosis present

## 2014-09-19 DIAGNOSIS — N179 Acute kidney failure, unspecified: Secondary | ICD-10-CM | POA: Diagnosis present

## 2014-09-19 DIAGNOSIS — E870 Hyperosmolality and hypernatremia: Secondary | ICD-10-CM | POA: Diagnosis not present

## 2014-09-19 DIAGNOSIS — R6521 Severe sepsis with septic shock: Secondary | ICD-10-CM | POA: Diagnosis present

## 2014-09-19 DIAGNOSIS — B9689 Other specified bacterial agents as the cause of diseases classified elsewhere: Secondary | ICD-10-CM | POA: Diagnosis present

## 2014-09-19 DIAGNOSIS — K219 Gastro-esophageal reflux disease without esophagitis: Secondary | ICD-10-CM | POA: Diagnosis present

## 2014-09-19 DIAGNOSIS — A419 Sepsis, unspecified organism: Principal | ICD-10-CM | POA: Diagnosis present

## 2014-09-19 DIAGNOSIS — I5032 Chronic diastolic (congestive) heart failure: Secondary | ICD-10-CM | POA: Diagnosis present

## 2014-09-19 DIAGNOSIS — J45909 Unspecified asthma, uncomplicated: Secondary | ICD-10-CM | POA: Diagnosis present

## 2014-09-19 DIAGNOSIS — D6959 Other secondary thrombocytopenia: Secondary | ICD-10-CM | POA: Diagnosis present

## 2014-09-19 DIAGNOSIS — E876 Hypokalemia: Secondary | ICD-10-CM | POA: Diagnosis not present

## 2014-09-19 DIAGNOSIS — I5033 Acute on chronic diastolic (congestive) heart failure: Secondary | ICD-10-CM | POA: Diagnosis present

## 2014-09-19 DIAGNOSIS — I10 Essential (primary) hypertension: Secondary | ICD-10-CM | POA: Diagnosis present

## 2014-09-19 DIAGNOSIS — N170 Acute kidney failure with tubular necrosis: Secondary | ICD-10-CM | POA: Diagnosis present

## 2014-09-19 DIAGNOSIS — M810 Age-related osteoporosis without current pathological fracture: Secondary | ICD-10-CM | POA: Diagnosis present

## 2014-09-19 DIAGNOSIS — Z66 Do not resuscitate: Secondary | ICD-10-CM | POA: Diagnosis present

## 2014-09-19 DIAGNOSIS — Z515 Encounter for palliative care: Secondary | ICD-10-CM

## 2014-09-19 DIAGNOSIS — D696 Thrombocytopenia, unspecified: Secondary | ICD-10-CM

## 2014-09-19 DIAGNOSIS — R531 Weakness: Secondary | ICD-10-CM

## 2014-09-19 DIAGNOSIS — R509 Fever, unspecified: Secondary | ICD-10-CM

## 2014-09-19 HISTORY — DX: Calculus of kidney: N20.0

## 2014-09-19 HISTORY — DX: Pneumonia, unspecified organism: J18.9

## 2014-09-19 HISTORY — DX: Unspecified osteoarthritis, unspecified site: M19.90

## 2014-09-19 HISTORY — DX: Unspecified asthma, uncomplicated: J45.909

## 2014-09-19 HISTORY — DX: Urinary tract infection, site not specified: N39.0

## 2014-09-19 LAB — CBC WITH DIFFERENTIAL/PLATELET
BASOS PCT: 0 % (ref 0–1)
Basophils Absolute: 0 10*3/uL (ref 0.0–0.1)
EOS ABS: 0 10*3/uL (ref 0.0–0.7)
Eosinophils Relative: 0 % (ref 0–5)
HEMATOCRIT: 46.3 % — AB (ref 36.0–46.0)
HEMOGLOBIN: 14.4 g/dL (ref 12.0–15.0)
LYMPHS PCT: 4 % — AB (ref 12–46)
Lymphs Abs: 0.7 10*3/uL (ref 0.7–4.0)
MCH: 29 pg (ref 26.0–34.0)
MCHC: 31.1 g/dL (ref 30.0–36.0)
MCV: 93.3 fL (ref 78.0–100.0)
Monocytes Absolute: 0.4 10*3/uL (ref 0.1–1.0)
Monocytes Relative: 2 % — ABNORMAL LOW (ref 3–12)
NEUTROS ABS: 17.6 10*3/uL — AB (ref 1.7–7.7)
Neutrophils Relative %: 94 % — ABNORMAL HIGH (ref 43–77)
Platelets: 92 10*3/uL — ABNORMAL LOW (ref 150–400)
RBC: 4.96 MIL/uL (ref 3.87–5.11)
RDW: 14.9 % (ref 11.5–15.5)
WBC Morphology: INCREASED
WBC: 18.7 10*3/uL — ABNORMAL HIGH (ref 4.0–10.5)

## 2014-09-19 LAB — COMPREHENSIVE METABOLIC PANEL
ALT: 17 U/L (ref 0–35)
ANION GAP: 19 — AB (ref 5–15)
AST: 28 U/L (ref 0–37)
Albumin: 2.7 g/dL — ABNORMAL LOW (ref 3.5–5.2)
Alkaline Phosphatase: 99 U/L (ref 39–117)
BUN: 18 mg/dL (ref 6–23)
CO2: 19 meq/L (ref 19–32)
Calcium: 9.4 mg/dL (ref 8.4–10.5)
Chloride: 103 mEq/L (ref 96–112)
Creatinine, Ser: 2.52 mg/dL — ABNORMAL HIGH (ref 0.50–1.10)
GFR calc Af Amer: 19 mL/min — ABNORMAL LOW (ref >90)
GFR, EST NON AFRICAN AMERICAN: 17 mL/min — AB (ref >90)
GLUCOSE: 112 mg/dL — AB (ref 70–99)
POTASSIUM: 3.4 meq/L — AB (ref 3.7–5.3)
Sodium: 141 mEq/L (ref 137–147)
Total Bilirubin: 0.8 mg/dL (ref 0.3–1.2)
Total Protein: 6 g/dL (ref 6.0–8.3)

## 2014-09-19 LAB — URINALYSIS, ROUTINE W REFLEX MICROSCOPIC
Glucose, UA: NEGATIVE mg/dL
Ketones, ur: 15 mg/dL — AB
Nitrite: NEGATIVE
PROTEIN: 100 mg/dL — AB
Specific Gravity, Urine: 1.018 (ref 1.005–1.030)
UROBILINOGEN UA: 1 mg/dL (ref 0.0–1.0)
pH: 6 (ref 5.0–8.0)

## 2014-09-19 LAB — I-STAT ARTERIAL BLOOD GAS, ED
Acid-base deficit: 12 mmol/L — ABNORMAL HIGH (ref 0.0–2.0)
Bicarbonate: 14.7 mEq/L — ABNORMAL LOW (ref 20.0–24.0)
O2 SAT: 97 %
PCO2 ART: 35.9 mmHg (ref 35.0–45.0)
TCO2: 16 mmol/L (ref 0–100)
pH, Arterial: 7.221 — ABNORMAL LOW (ref 7.350–7.450)
pO2, Arterial: 102 mmHg — ABNORMAL HIGH (ref 80.0–100.0)

## 2014-09-19 LAB — RAPID URINE DRUG SCREEN, HOSP PERFORMED
Amphetamines: NOT DETECTED
BARBITURATES: NOT DETECTED
Benzodiazepines: NOT DETECTED
COCAINE: NOT DETECTED
Opiates: NOT DETECTED
Tetrahydrocannabinol: NOT DETECTED

## 2014-09-19 LAB — I-STAT TROPONIN, ED: TROPONIN I, POC: 0.06 ng/mL (ref 0.00–0.08)

## 2014-09-19 LAB — URINE MICROSCOPIC-ADD ON

## 2014-09-19 LAB — CBG MONITORING, ED: Glucose-Capillary: 110 mg/dL — ABNORMAL HIGH (ref 70–99)

## 2014-09-19 LAB — PRO B NATRIURETIC PEPTIDE: Pro B Natriuretic peptide (BNP): 6958 pg/mL — ABNORMAL HIGH (ref 0–450)

## 2014-09-19 LAB — AMMONIA: Ammonia: 40 umol/L (ref 11–60)

## 2014-09-19 LAB — I-STAT CG4 LACTIC ACID, ED: Lactic Acid, Venous: 9.18 mmol/L — ABNORMAL HIGH (ref 0.5–2.2)

## 2014-09-19 MED ORDER — KETOTIFEN FUMARATE 0.025 % OP SOLN
1.0000 [drp] | Freq: Two times a day (BID) | OPHTHALMIC | Status: DC
  Administered 2014-09-20 – 2014-09-29 (×19): 1 [drp] via OPHTHALMIC
  Filled 2014-09-19 (×2): qty 5

## 2014-09-19 MED ORDER — MORPHINE SULFATE 2 MG/ML IJ SOLN: 2.0000 mg | INTRAMUSCULAR | Status: DC | PRN

## 2014-09-19 MED ORDER — SODIUM CHLORIDE 0.9 % IV BOLUS (SEPSIS)
1000.0000 mL | INTRAVENOUS | Status: DC
  Administered 2014-09-19: 1000 mL via INTRAVENOUS

## 2014-09-19 MED ORDER — MONTELUKAST SODIUM 10 MG PO TABS
10.0000 mg | ORAL_TABLET | Freq: Every day | ORAL | Status: DC
  Administered 2014-09-19 – 2014-09-28 (×7): 10 mg via ORAL
  Filled 2014-09-19 (×11): qty 1

## 2014-09-19 MED ORDER — VITAMIN B-1 100 MG PO TABS
100.0000 mg | ORAL_TABLET | Freq: Every day | ORAL | Status: DC
  Filled 2014-09-19: qty 1

## 2014-09-19 MED ORDER — ALUM & MAG HYDROXIDE-SIMETH 200-200-20 MG/5ML PO SUSP: 30.0000 mL | Freq: Four times a day (QID) | ORAL | Status: DC | PRN

## 2014-09-19 MED ORDER — PIPERACILLIN-TAZOBACTAM IN DEX 2-0.25 GM/50ML IV SOLN
2.2500 g | Freq: Three times a day (TID) | INTRAVENOUS | Status: DC
  Filled 2014-09-19 (×2): qty 50

## 2014-09-19 MED ORDER — ACETAMINOPHEN 325 MG PO TABS
650.0000 mg | ORAL_TABLET | Freq: Four times a day (QID) | ORAL | Status: DC | PRN
  Administered 2014-09-23 – 2014-09-27 (×4): 650 mg via ORAL
  Filled 2014-09-19 (×4): qty 2

## 2014-09-19 MED ORDER — VANCOMYCIN HCL IN DEXTROSE 1-5 GM/200ML-% IV SOLN
1000.0000 mg | Freq: Once | INTRAVENOUS | Status: AC
  Administered 2014-09-19: 1000 mg via INTRAVENOUS
  Filled 2014-09-19: qty 200

## 2014-09-19 MED ORDER — ADULT MULTIVITAMIN W/MINERALS CH
1.0000 | ORAL_TABLET | Freq: Every day | ORAL | Status: DC
  Filled 2014-09-19: qty 1

## 2014-09-19 MED ORDER — VANCOMYCIN HCL IN DEXTROSE 1-5 GM/200ML-% IV SOLN: 1000.0000 mg | INTRAVENOUS | Status: DC

## 2014-09-19 MED ORDER — SODIUM CHLORIDE 0.9 % IV BOLUS (SEPSIS)
2000.0000 mL | Freq: Once | INTRAVENOUS | Status: AC
  Administered 2014-09-19: 2000 mL via INTRAVENOUS

## 2014-09-19 MED ORDER — CETYLPYRIDINIUM CHLORIDE 0.05 % MT LIQD
7.0000 mL | Freq: Two times a day (BID) | OROMUCOSAL | Status: DC
  Administered 2014-09-21: 7 mL via OROMUCOSAL

## 2014-09-19 MED ORDER — ALBUTEROL SULFATE (2.5 MG/3ML) 0.083% IN NEBU
2.5000 mg | INHALATION_SOLUTION | Freq: Four times a day (QID) | RESPIRATORY_TRACT | Status: DC | PRN
  Administered 2014-09-23 – 2014-09-28 (×3): 2.5 mg via RESPIRATORY_TRACT
  Filled 2014-09-19 (×3): qty 3

## 2014-09-19 MED ORDER — PREDNISONE 5 MG PO TABS
5.0000 mg | ORAL_TABLET | Freq: Every day | ORAL | Status: DC
  Filled 2014-09-19 (×2): qty 1

## 2014-09-19 MED ORDER — LORAZEPAM 2 MG/ML IJ SOLN
0.5000 mg | Freq: Once | INTRAMUSCULAR | Status: AC
  Administered 2014-09-19: 0.5 mg via INTRAVENOUS
  Filled 2014-09-19: qty 1

## 2014-09-19 MED ORDER — HEPARIN SODIUM (PORCINE) 5000 UNIT/ML IJ SOLN
5000.0000 [IU] | Freq: Three times a day (TID) | INTRAMUSCULAR | Status: DC
  Administered 2014-09-19 – 2014-09-23 (×11): 5000 [IU] via SUBCUTANEOUS
  Filled 2014-09-19 (×14): qty 1

## 2014-09-19 MED ORDER — PREDNISONE (PAK) 5 MG PO TABS
5.0000 mg | ORAL_TABLET | Freq: Every day | ORAL | Status: DC
Start: 2014-09-20 — End: 2014-09-19
  Filled 2014-09-19: qty 21

## 2014-09-19 MED ORDER — PANTOPRAZOLE SODIUM 40 MG PO TBEC
80.0000 mg | DELAYED_RELEASE_TABLET | Freq: Every day | ORAL | Status: DC
  Administered 2014-09-21 – 2014-09-24 (×4): 80 mg via ORAL
  Filled 2014-09-19 (×5): qty 2

## 2014-09-19 MED ORDER — FOLIC ACID 1 MG PO TABS
1.0000 mg | ORAL_TABLET | Freq: Every day | ORAL | Status: DC
  Administered 2014-09-19: 1 mg via ORAL
  Filled 2014-09-19 (×2): qty 1

## 2014-09-19 MED ORDER — SODIUM CHLORIDE 0.9 % IV SOLN
INTRAVENOUS | Status: DC
  Administered 2014-09-19 – 2014-09-21 (×3): via INTRAVENOUS

## 2014-09-19 MED ORDER — FLUTICASONE PROPIONATE 50 MCG/ACT NA SUSP
1.0000 | Freq: Two times a day (BID) | NASAL | Status: DC | PRN
  Administered 2014-09-24: 1 via NASAL
  Filled 2014-09-19: qty 16

## 2014-09-19 MED ORDER — PIPERACILLIN-TAZOBACTAM 3.375 G IVPB 30 MIN
3.3750 g | Freq: Once | INTRAVENOUS | Status: AC
  Administered 2014-09-19: 3.375 g via INTRAVENOUS
  Filled 2014-09-19: qty 50

## 2014-09-19 MED ORDER — BUDESONIDE 0.5 MG/2ML IN SUSP
0.5000 mg | Freq: Two times a day (BID) | RESPIRATORY_TRACT | Status: DC
  Administered 2014-09-19 – 2014-09-29 (×16): 0.5 mg via RESPIRATORY_TRACT
  Filled 2014-09-19 (×24): qty 2

## 2014-09-19 MED ORDER — VANCOMYCIN HCL IN DEXTROSE 1-5 GM/200ML-% IV SOLN
1000.0000 mg | INTRAVENOUS | Status: DC
  Filled 2014-09-19: qty 200

## 2014-09-19 MED ORDER — PIPERACILLIN-TAZOBACTAM IN DEX 2-0.25 GM/50ML IV SOLN
2.2500 g | Freq: Three times a day (TID) | INTRAVENOUS | Status: DC
  Administered 2014-09-20 – 2014-09-22 (×9): 2.25 g via INTRAVENOUS
  Filled 2014-09-19 (×11): qty 50

## 2014-09-19 MED ORDER — ACETAMINOPHEN 650 MG RE SUPP
650.0000 mg | Freq: Four times a day (QID) | RECTAL | Status: DC | PRN
  Filled 2014-09-19: qty 1

## 2014-09-19 MED ORDER — MEMANTINE HCL 10 MG PO TABS
10.0000 mg | ORAL_TABLET | Freq: Two times a day (BID) | ORAL | Status: DC
  Administered 2014-09-19 – 2014-09-29 (×16): 10 mg via ORAL
  Filled 2014-09-19 (×21): qty 1

## 2014-09-19 NOTE — ED Notes (Signed)
Dr. Lafayette Dragonarr speaking with family about central line insertion.

## 2014-09-19 NOTE — ED Notes (Signed)
Dr. Lafayette Dragonarr at bedside discussing direction of care and goals of treatment with family member.

## 2014-09-19 NOTE — ED Notes (Signed)
Consent signed by Daughter for central line insertion.

## 2014-09-19 NOTE — ED Notes (Signed)
Per EMS: from [facility], altered LOC according to [facility] and family since 1030 this am also a period of unresponsiveness as well but unclear of duration of unresponsiveness.  Hx of dementia alz, given 600 fluid bolus, after this loc improved some.  Hypotensive en route.  88/60, audible wheeze given 5 albuterol en route. CBG 98. 95% 12 ok. fam en route

## 2014-09-19 NOTE — Progress Notes (Signed)
ANTIBIOTIC CONSULT NOTE - INITIAL  Pharmacy Consult for Zosyn/Vancomycin Indication: Sepsis  No Known Allergies  Patient Measurements: Height: 4\' 11"  (149.9 cm) Weight: 143 lb (64.864 kg) IBW/kg (Calculated) : 43.2  Vital Signs: Temp: 98.2 F (36.8 C) (10/20 1615) Temp Source: Oral (10/20 1615) BP: 80/47 mmHg (10/20 1615) Pulse Rate: 97 (10/20 1615)  Labs: No results found for this basename: WBC, HGB, PLT, LABCREA, CREATININE,  in the last 72 hours Estimated Creatinine Clearance: 29.1 ml/min (by C-G formula based on Cr of 1.2). No results found for this basename: VANCOTROUGH, VANCOPEAK, VANCORANDOM, GENTTROUGH, GENTPEAK, GENTRANDOM, TOBRATROUGH, TOBRAPEAK, TOBRARND, AMIKACINPEAK, AMIKACINTROU, AMIKACIN,  in the last 72 hours   Microbiology: No results found for this or any previous visit (from the past 720 hour(s)).  Medical History: Past Medical History  Diagnosis Date  . Asthma   . Hypertension   . Osteoporosis   . GERD (gastroesophageal reflux disease)   . Dementia   . Pulmonary embolism     hx of 4/09, sees Dr. Sherene SiresWert   . Leg edema   . DVT (deep venous thrombosis)    Assessment: 80 y/o F from [facility] with AMS and multiple episodes of unresponsiveness. Patient received dose of Zosyn IV 3.375g and Vancomycin 1000mg  in the ED.  Afebrile, BP 80/47, WBC 18.7. CrCl 13.9 mL/min.  Patient has a current AKI w/ SCr of 2.5, baseline 1 - 1.2. Empiric antibiotics for sepsis.    Blood cx--> Urine cx-->  Goal of Therapy:  Vancomycin trough 15-20  Plan:  Zosyn IV 2.25 g IV q8h  Vancomycin IV 1000 mg q48h F/u vancomycin trough, renal function, cultures and susceptibilities, and resolution of AKI.  Marisue BrooklynGazda, Nicholas 09/19/2014,4:45 PM   Note has been reviewed, I agree with the assessment and plan.    Agapito GamesAlison Bazil Dhanani, PharmD, BCPS Clinical Pharmacist Pager: [phone] 09/19/2014 6:32 PM

## 2014-09-19 NOTE — Progress Notes (Addendum)
Called for report. 

## 2014-09-19 NOTE — ED Provider Notes (Signed)
CSN: [id number]     Arrival date & time 09/19/14  1605 History   First MD Initiated Contact with Patient 09/19/14 1608     Chief Complaint  Patient presents with  . Altered Mental Status     (Consider location/radiation/quality/duration/timing/severity/associated sxs/prior Treatment) Patient is a 80 y.o. female presenting with altered mental status.  Altered Mental Status Presenting symptoms: partial responsiveness   Severity:  Unable to specify Most recent episode:  Today Episode history:  Continuous Duration:  10 hours Timing:  Constant Progression:  Waxing and waning Chronicity:  New Context: dementia and recent illness   Associated symptoms: difficulty breathing   Associated symptoms comment:  Wheezing, diaphoresis   Past Medical History  Diagnosis Date  . Asthma   . Hypertension   . Osteoporosis   . GERD (gastroesophageal reflux disease)   . Dementia   . Pulmonary embolism     hx of 4/09, sees Dr. [name]   . Leg edema   . DVT (deep venous thrombosis)    Past Surgical History  Procedure Laterality Date  . Cholecystectomy    . Lithotripsy    . Right knee surgery    . Hip fracture surgery     Family History  Problem Relation Age of Onset  . Arthritis Other   . Hypertension Other   . Cancer Other     lung   History  Substance Use Topics  . Smoking status: Never Smoker   . Smokeless tobacco: Never Used  . Alcohol Use: No   OB History   Grav Para Term Preterm Abortions TAB SAB Ect Mult Living                 Review of Systems  Unable to perform ROS: Dementia      Allergies  Review of patient's allergies indicates no known allergies.  Home Medications   Prior to Admission medications   Medication Sig Start Date End Date Taking? Authorizing Provider  acetaminophen (TYLENOL) 500 MG tablet Take 1,000 mg by mouth 2 (two) times daily.    Yes Historical Provider, MD  albuterol (PROVENTIL) (2.5 MG/3ML) 0.083% nebulizer solution Take 2.5 mg by  nebulization every 6 (six) hours as needed. For shortness of breath   Yes Historical Provider, MD  Ascorbic Acid (VITAMIN C) 500 MG tablet Take 500 mg by mouth as needed (for congestion).    Yes Historical Provider, MD  azelastine (OPTIVAR) 0.05 % ophthalmic solution Place 1 drop into both eyes 2 (two) times daily.     Yes Historical Provider, MD  budesonide (PULMICORT) 0.5 MG/2ML nebulizer solution Take 0.5 mg by nebulization 2 (two) times daily as needed. wheezing   Yes Historical Provider, MD  calcium carbonate (TUMS - DOSED IN MG ELEMENTAL CALCIUM) 500 MG chewable tablet Chew 1 tablet by mouth daily.    Yes Historical Provider, MD  Cholecalciferol (VITAMIN D3) 2000 UNITS TABS Take 2,000 Units by mouth daily.   Yes Historical Provider, MD  Dextromethorphan-Guaifenesin (Q-TUSSIN DM) 10-100 MG/5ML liquid Take 10 mLs by mouth every 6 (six) hours as needed (for cough/ congestion).   Yes Historical Provider, MD  fluticasone (FLONASE) 50 MCG/ACT nasal spray Place 2 sprays into the nose 2 (two) times daily as needed. For congestion and runny nose   Yes Historical Provider, MD  magnesium hydroxide (MILK OF MAGNESIA) 400 MG/5ML suspension Take 15 mLs by mouth daily as needed for mild constipation.   Yes Historical Provider, MD  memantine (NAMENDA) 10 MG tablet Take 10  mg by mouth 2 (two) times daily.     Yes Historical Provider, MD  montelukast (SINGULAIR) 10 MG tablet Take 10 mg by mouth at bedtime.     Yes Historical Provider, MD  Multiple Vitamins-Minerals (MULTIVITAMINS THER. W/MINERALS) TABS Take 1 tablet by mouth daily.     Yes Historical Provider, MD  omeprazole (PRILOSEC) 40 MG capsule Take 40 mg by mouth daily.   Yes Historical Provider, MD  predniSONE (STERAPRED UNI-PAK) 5 MG TABS tablet Take 5 mg by mouth daily.   Yes Historical Provider, MD  STARCH-MALTO DEXTRIN (THICK-IT) POWD Take 1 application by mouth daily as needed (nectar consistency).   Yes Historical Provider, MD   BP 80/47  Pulse 103   Temp(Src) 98.2 F (36.8 C) (Oral)  Resp 24  Ht 4\' 11"  (1.499 m)  Wt 143 lb (64.864 kg)  BMI 28.87 kg/m2  SpO2 96% Physical Exam  Constitutional: She appears well-developed and well-nourished. She appears lethargic. No distress.  HENT:  Head: Normocephalic and atraumatic.  Eyes: EOM are normal. Pupils are equal, round, and reactive to light.  Neck: Neck supple.  Cardiovascular: Regular rhythm and normal heart sounds.  Tachycardia present.   No murmur heard. Pulmonary/Chest: No stridor. Tachypnea noted. No respiratory distress. She has wheezes.  Abdominal: Soft. There is no tenderness.  Musculoskeletal: She exhibits no edema.  Neurological: She appears lethargic. GCS eye subscore is 4. GCS verbal subscore is 1. GCS motor subscore is 6.  Skin: She is not diaphoretic.    ED Course  Procedures (including critical care time) Labs Review Labs Reviewed  CBC WITH DIFFERENTIAL - Abnormal; Notable for the following:    WBC 18.7 (*)    HCT 46.3 (*)    Platelets 92 (*)    Neutrophils Relative % 94 (*)    Lymphocytes Relative 4 (*)    Monocytes Relative 2 (*)    Neutro Abs 17.6 (*)    All other components within normal limits  COMPREHENSIVE METABOLIC PANEL - Abnormal; Notable for the following:    Potassium 3.4 (*)    Glucose, Bld 112 (*)    Creatinine, Ser 2.52 (*)    Albumin 2.7 (*)    GFR calc non Af Amer 17 (*)    GFR calc Af Amer 19 (*)    Anion gap 19 (*)    All other components within normal limits  URINALYSIS, ROUTINE W REFLEX MICROSCOPIC - Abnormal; Notable for the following:    Color, Urine AMBER (*)    APPearance TURBID (*)    Hgb urine dipstick LARGE (*)    Bilirubin Urine SMALL (*)    Ketones, ur 15 (*)    Protein, ur 100 (*)    Leukocytes, UA LARGE (*)    All other components within normal limits  PRO B NATRIURETIC PEPTIDE - Abnormal; Notable for the following:    Pro B Natriuretic peptide (BNP) 6958.0 (*)    All other components within normal limits  URINE  MICROSCOPIC-ADD ON - Abnormal; Notable for the following:    Squamous Epithelial / LPF FEW (*)    Bacteria, UA MANY (*)    All other components within normal limits  I-STAT CG4 LACTIC ACID, ED - Abnormal; Notable for the following:    Lactic Acid, Venous 9.18 (*)    All other components within normal limits  I-STAT ARTERIAL BLOOD GAS, ED - Abnormal; Notable for the following:    pH, Arterial 7.221 (*)    pO2, Arterial 102.0 (*)  Bicarbonate 14.7 (*)    Acid-base deficit 12.0 (*)    All other components within normal limits  CBG MONITORING, ED - Abnormal; Notable for the following:    Glucose-Capillary 110 (*)    All other components within normal limits  CULTURE, BLOOD (ROUTINE X 2)  CULTURE, BLOOD (ROUTINE X 2)  URINE CULTURE  AMMONIA  URINE RAPID DRUG SCREEN (HOSP PERFORMED)  I-STAT TROPOININ, ED  I-STAT CG4 LACTIC ACID, ED    Imaging Review Ct Head Wo Contrast  09/19/2014   CLINICAL DATA:  Altered mental status, hypotension  EXAM: CT HEAD WITHOUT CONTRAST  TECHNIQUE: Contiguous axial images were obtained from the base of the skull through the vertex without intravenous contrast.  COMPARISON:  11/03/2011  FINDINGS: There is mucosal thickening with air-fluid level bilateral maxillary sinus. Mucosal thickening stress that mild mucosal thickening bilateral ethmoid air cells. The mastoid air cells are unremarkable.  Atherosclerotic calcifications of carotid siphon.  No intracranial hemorrhage, mass effect or midline shift. Periventricular and patchy subcortical chronic white matter disease again noted. Stable cerebral atrophy. Ventricular size is stable from prior exam. No acute cortical infarction. No mass lesion is noted on this unenhanced scan.  IMPRESSION: No acute intracranial abnormality. Stable atrophy and chronic white matter disease. There is mucosal thickening with air-fluid level bilateral maxillary sinus.   Electronically Signed   By: Natasha MeadLiviu  Pop M.D.   On: 09/19/2014 18:18    Dg Chest Port 1 View  09/19/2014   CLINICAL DATA:  Mental status change. Now all unresponsive now on this morning and home vomiting.  EXAM: PORTABLE CHEST - 1 VIEW  COMPARISON:  Chest x-ray dated July 24, 2012  FINDINGS: The lungs are mildly hypoinflated. There is no focal infiltrate. The interstitial markings are increased. There is no pneumothorax or pleural effusion. The cardiac silhouette mildly enlarged though stable. There is stable tortuosity of the ascending and descending thoracic aorta. The pulmonary vascularity is slightly less distinct today. There are degenerative changes of both shoulders. No acute rib fracture is observed today. There is stable pleural thickening adjacent to the lateral aspect of the left second rib.  IMPRESSION: The findings suggest low-grade CHF with mild interstitial edema. There is no focal pneumonia.   Electronically Signed   By: David  [place]   On: 09/19/2014 17:28     EKG Interpretation   Date/Time:  Tuesday September 19 2014 16:14:18 EDT Ventricular Rate:  97 PR Interval:  131 QRS Duration: 63 QT Interval:  436 QTC Calculation: 554 R Axis:   49 Text Interpretation:  Sinus rhythm Low voltage, precordial leads  Borderline T abnormalities, diffuse leads Prolonged QT interval No  significant change since last tracing Confirmed by ALLEN  MD, ANTHONY  ([id number]) on 09/19/2014 4:26:34 PM      MDM   Final diagnoses:  Septic shock  Acute pyelonephritis    Patient is a 80 year old female with a history of dementia, asthma that presents with altered mental status. Patient had one episode of vomiting this morning and her daughter took her to her daytime assisted-living facility at which point she became unresponsive and had another episode of emesis. Patient had very loud audible wheezing when EMS arrived continuous albuterol was placed. Patient opened her eyes and follows however she will not respond verbally. Per the daughter's report the patient does not  speak much at baseline. On arrival to the ED the patient is tachycardic, tachypneic, hypotensive. Good sepsis was called and patient had blood cultures drawn and they  can Zosyn given. We will fluid resuscitate patient. Patient given 700 cc of fluid at the nursing facility. On patient's EKG she has a prolonged QTC at 554, and this is new from prior EKG 2012. We will refrain from giving any medications that would complicate this. Patient's workup significant for urinary tract infection likely the source of infection. Patient continues to have hypotension despite aggressive fluid resuscitation. After talking with critical care and family it has subsided at this time that the patient is to be made a DO NOT RESUSCITATE, DO NOT INTUBATE, no central line, and no pressors. Patient is to be made comfort care at this time with antibiotics. We will admit the patient to the floor for further management and care.   Beverely Risen, MD 09/19/14 [phone]

## 2014-09-19 NOTE — Progress Notes (Signed)
Patient admitted to unit via stretcher to [phone]. Family at bedside. Pt is currently nonverbal, but responds to stimuli. Per patient's daughter Clydie BraunKaren, "she can talk, she just chooses when she does." Skin intact. IV intact. Foley in place without complications. BP 81/48. MD aware. Will continue to monitor patient per MD orders.

## 2014-09-19 NOTE — ED Provider Notes (Signed)
I saw and evaluated the patient, reviewed the resident's note and I agree with the findings and plan.   EKG Interpretation   Date/Time:  Tuesday September 19 2014 16:14:18 EDT Ventricular Rate:  97 PR Interval:  131 QRS Duration: 63 QT Interval:  436 QTC Calculation: 554 R Axis:   49 Text Interpretation:  Sinus rhythm Low voltage, precordial leads  Borderline T abnormalities, diffuse leads Prolonged QT interval No  significant change since last tracing Confirmed by Khaleelah Yowell  MD, Rodgerick Gilliand  ([id number]) on 09/19/2014 4:26:34 PM     Patient here with altered mental status since this morning. Has had recent cough and congestion. Was noted to be hypotensive on arrival. She also has audible wheezing throughout. Given IV fluids. Patient's abdominal exam is benign and pulmonary exam shows expiratory wheezing. Suspect the patient, the source of infection and to sepsis called and patient to be started on antibiotics and will be admitted. CODE STATUS discussed with family and they're still deciding  Toy BakerAnthony T Allysen Lazo, MD 09/19/14 1649

## 2014-09-19 NOTE — ED Notes (Signed)
Per daughter at bedside, patient speaks some but not all the time.  Daughter is POA

## 2014-09-19 NOTE — ED Notes (Signed)
Pharmacy called for zosyn. Not available to pull out of pyxis

## 2014-09-19 NOTE — ED Notes (Signed)
Elevated CG-4 of 9.18 reported to Dr. Lafayette Dragonarr.

## 2014-09-19 NOTE — H&P (Signed)
[facility] History and Physical  Kristen HumphreyMargaret L Kearley ZOX:[id number] DOB: 07/31/1929 DOA: 09/19/2014  Referring physician: Lorre NickAnthony Allen, MD PCP: Thane EduKOEHLER,ROBERT NICHOLAS, MD   Chief Complaint: Altered Mental Status  HPI: [name] is a 80 y.o. female with baseline dementia presents with altered mental status and sepsis. Apparently the patient went to bed last night and was fine according to her daughter she noted that later in the night she was very sweaty. She then developed some vomiting. She is not sure if the patient aspirated. The daughter states that she tried to get her up to the commode as her belly was not feeling right. Patient was taken to the daytime assisted facitily. At that time she vomited again and also became unresponsive. She was brought here to the ED and was noted to be hypotensive and also tachycardic and tachypneic. Critical care was called and after discussion with the family it was decided to make her DNR and not to proceed with aggressive measures other than antibiotics. I have discussed this with the family again and they are in agreement with comfort care at this time but to continue with antibiotics.   Review of Systems:  Patient is not able to participate in a ROS  Past Medical History  Diagnosis Date  . Asthma   . Hypertension   . Osteoporosis   . GERD (gastroesophageal reflux disease)   . Dementia   . Pulmonary embolism     hx of 4/09, sees Dr. Sherene SiresWert   . Leg edema   . DVT (deep venous thrombosis)    Past Surgical History  Procedure Laterality Date  . Cholecystectomy    . Lithotripsy    . Right knee surgery    . Hip fracture surgery     Social History:  reports that she has never smoked. She has never used smokeless tobacco. She reports that she does not drink alcohol or use illicit drugs.  No Known Allergies  Family History  Problem Relation Age of Onset  . Arthritis Other   . Hypertension Other   . Cancer Other     lung      Prior to Admission medications   Medication Sig Start Date End Date Taking? Authorizing Provider  acetaminophen (TYLENOL) 500 MG tablet Take 1,000 mg by mouth 2 (two) times daily.    Yes Historical Provider, MD  albuterol (PROVENTIL) (2.5 MG/3ML) 0.083% nebulizer solution Take 2.5 mg by nebulization every 6 (six) hours as needed. For shortness of breath   Yes Historical Provider, MD  Ascorbic Acid (VITAMIN C) 500 MG tablet Take 500 mg by mouth as needed (for congestion).    Yes Historical Provider, MD  azelastine (OPTIVAR) 0.05 % ophthalmic solution Place 1 drop into both eyes 2 (two) times daily.     Yes Historical Provider, MD  budesonide (PULMICORT) 0.5 MG/2ML nebulizer solution Take 0.5 mg by nebulization 2 (two) times daily as needed. wheezing   Yes Historical Provider, MD  calcium carbonate (TUMS - DOSED IN MG ELEMENTAL CALCIUM) 500 MG chewable tablet Chew 1 tablet by mouth daily.    Yes Historical Provider, MD  Cholecalciferol (VITAMIN D3) 2000 UNITS TABS Take 2,000 Units by mouth daily.   Yes Historical Provider, MD  Dextromethorphan-Guaifenesin (Q-TUSSIN DM) 10-100 MG/5ML liquid Take 10 mLs by mouth every 6 (six) hours as needed (for cough/ congestion).   Yes Historical Provider, MD  fluticasone (FLONASE) 50 MCG/ACT nasal spray Place 2 sprays into the nose 2 (two) times daily as needed.  For congestion and runny nose   Yes Historical Provider, MD  magnesium hydroxide (MILK OF MAGNESIA) 400 MG/5ML suspension Take 15 mLs by mouth daily as needed for mild constipation.   Yes Historical Provider, MD  memantine (NAMENDA) 10 MG tablet Take 10 mg by mouth 2 (two) times daily.     Yes Historical Provider, MD  montelukast (SINGULAIR) 10 MG tablet Take 10 mg by mouth at bedtime.     Yes Historical Provider, MD  Multiple Vitamins-Minerals (MULTIVITAMINS THER. W/MINERALS) TABS Take 1 tablet by mouth daily.     Yes Historical Provider, MD  omeprazole (PRILOSEC) 40 MG capsule Take 40 mg by mouth daily.    Yes Historical Provider, MD  predniSONE (STERAPRED UNI-PAK) 5 MG TABS tablet Take 5 mg by mouth daily.   Yes Historical Provider, MD  STARCH-MALTO DEXTRIN (THICK-IT) POWD Take 1 application by mouth daily as needed (nectar consistency).   Yes Historical Provider, MD   Physical Exam: Filed Vitals:   09/19/14 1718 09/19/14 1730 09/19/14 1731 09/19/14 1745  BP: 90/41 66/36 82/53  81/46  Pulse: 97 100 101 102  Temp: 98.8 F (37.1 C) 98.4 F (36.9 C) 98.2 F (36.8 C) 98.1 F (36.7 C)  TempSrc:      Resp: 20 20 20 21   Height:      Weight:      SpO2: 100% 93% 100% 99%    Wt Readings from Last 3 Encounters:  09/19/14 64.864 kg (143 lb)  07/24/12 67.132 kg (148 lb)  11/03/11 62.9 kg (138 lb 10.7 oz)    General:  Comfortable not responsive Eyes: unable to assess ENT: not able to assess Neck: no LAD, masses or thyromegaly Cardiovascular: RRR, no m/r/g. Trace LE edema. Respiratory: ++ronchi bilaterally Abdomen: soft, ntnd Skin: no rash or induration seen on limited exam Musculoskeletal: grossly normal tone BUE/BLE Psychiatric: non-verbal Neurologic: unable to assess.          Labs on Admission:  Basic Metabolic Panel:  Recent Labs Lab 09/19/14 1651  NA 141  K 3.4*  CL 103  CO2 19  GLUCOSE 112*  BUN 18  CREATININE 2.52*  CALCIUM 9.4   Liver Function Tests:  Recent Labs Lab 09/19/14 1651  AST 28  ALT 17  ALKPHOS 99  BILITOT 0.8  PROT 6.0  ALBUMIN 2.7*   No results found for this basename: LIPASE, AMYLASE,  in the last 168 hours No results found for this basename: AMMONIA,  in the last 168 hours CBC:  Recent Labs Lab 09/19/14 1651  WBC 18.7*  NEUTROABS 17.6*  HGB 14.4  HCT 46.3*  MCV 93.3  PLT 92*   Cardiac Enzymes: No results found for this basename: CKTOTAL, CKMB, CKMBINDEX, TROPONINI,  in the last 168 hours  BNP (last 3 results)  Recent Labs  09/19/14 1651  PROBNP 6958.0*   CBG:  Recent Labs Lab 09/19/14 1701  GLUCAP 110*     Radiological Exams on Admission: Ct Head Wo Contrast  09/19/2014   CLINICAL DATA:  Altered mental status, hypotension  EXAM: CT HEAD WITHOUT CONTRAST  TECHNIQUE: Contiguous axial images were obtained from the base of the skull through the vertex without intravenous contrast.  COMPARISON:  11/03/2011  FINDINGS: There is mucosal thickening with air-fluid level bilateral maxillary sinus. Mucosal thickening stress that mild mucosal thickening bilateral ethmoid air cells. The mastoid air cells are unremarkable.  Atherosclerotic calcifications of carotid siphon.  No intracranial hemorrhage, mass effect or midline shift. Periventricular and patchy subcortical chronic white  matter disease again noted. Stable cerebral atrophy. Ventricular size is stable from prior exam. No acute cortical infarction. No mass lesion is noted on this unenhanced scan.  IMPRESSION: No acute intracranial abnormality. Stable atrophy and chronic white matter disease. There is mucosal thickening with air-fluid level bilateral maxillary sinus.   Electronically Signed   By: Natasha Mead M.D.   On: 09/19/2014 18:18   Dg Chest Port 1 View  09/19/2014   CLINICAL DATA:  Mental status change. Now all unresponsive now on this morning and home vomiting.  EXAM: PORTABLE CHEST - 1 VIEW  COMPARISON:  Chest x-ray dated July 24, 2012  FINDINGS: The lungs are mildly hypoinflated. There is no focal infiltrate. The interstitial markings are increased. There is no pneumothorax or pleural effusion. The cardiac silhouette mildly enlarged though stable. There is stable tortuosity of the ascending and descending thoracic aorta. The pulmonary vascularity is slightly less distinct today. There are degenerative changes of both shoulders. No acute rib fracture is observed today. There is stable pleural thickening adjacent to the lateral aspect of the left second rib.  IMPRESSION: The findings suggest low-grade CHF with mild interstitial edema. There is no focal  pneumonia.   Electronically Signed   By: David  [place]   On: 09/19/2014 17:28     Assessment/Plan Principal Problem:   Septic shock Active Problems:   Asthma   Altered mental status   1. Septic shock -initially patient was to be admitted to the ICU however the family wants to keep her comfortable now and is a DNR -she will be continued on antibiotics vancomycin and zosyn -family does not not want pressors or central lines.  -no other aggressive measures  2. Asthma -will continue with nebulizer as needed -will monitor response -oxygen as needed  3. Altered Mental Status -likely related to sepsis and hypotension -baseline according to family is that she is not very verbal  Code Status: DNR (must indicate code status--if unknown or must be presumed, indicate so) DVT Prophylaxis:heparin Family Communication: Daughter and Sons (indicate person spoken with, if applicable, with phone number if by telephone) Disposition Plan: Home (indicate anticipated LOS)  Time spent:  [facility] Pager [phone]

## 2014-09-20 DIAGNOSIS — I5032 Chronic diastolic (congestive) heart failure: Secondary | ICD-10-CM | POA: Diagnosis present

## 2014-09-20 DIAGNOSIS — N179 Acute kidney failure, unspecified: Secondary | ICD-10-CM | POA: Diagnosis present

## 2014-09-20 DIAGNOSIS — R4182 Altered mental status, unspecified: Secondary | ICD-10-CM

## 2014-09-20 DIAGNOSIS — R531 Weakness: Secondary | ICD-10-CM

## 2014-09-20 DIAGNOSIS — R34 Anuria and oliguria: Secondary | ICD-10-CM

## 2014-09-20 DIAGNOSIS — Z515 Encounter for palliative care: Secondary | ICD-10-CM

## 2014-09-20 DIAGNOSIS — A419 Sepsis, unspecified organism: Principal | ICD-10-CM

## 2014-09-20 DIAGNOSIS — I5030 Unspecified diastolic (congestive) heart failure: Secondary | ICD-10-CM

## 2014-09-20 DIAGNOSIS — R6521 Severe sepsis with septic shock: Secondary | ICD-10-CM

## 2014-09-20 DIAGNOSIS — Z66 Do not resuscitate: Secondary | ICD-10-CM

## 2014-09-20 DIAGNOSIS — J45909 Unspecified asthma, uncomplicated: Secondary | ICD-10-CM

## 2014-09-20 LAB — COMPREHENSIVE METABOLIC PANEL
ALT: 47 U/L — ABNORMAL HIGH (ref 0–35)
AST: 80 U/L — AB (ref 0–37)
Albumin: 2.4 g/dL — ABNORMAL LOW (ref 3.5–5.2)
Alkaline Phosphatase: 97 U/L (ref 39–117)
Anion gap: 19 — ABNORMAL HIGH (ref 5–15)
BILIRUBIN TOTAL: 1.1 mg/dL (ref 0.3–1.2)
BUN: 22 mg/dL (ref 6–23)
CO2: 15 mEq/L — ABNORMAL LOW (ref 19–32)
CREATININE: 2.53 mg/dL — AB (ref 0.50–1.10)
Calcium: 8 mg/dL — ABNORMAL LOW (ref 8.4–10.5)
Chloride: 111 mEq/L (ref 96–112)
GFR calc Af Amer: 19 mL/min — ABNORMAL LOW (ref >90)
GFR calc non Af Amer: 16 mL/min — ABNORMAL LOW (ref >90)
Glucose, Bld: 52 mg/dL — ABNORMAL LOW (ref 70–99)
Potassium: 4.1 mEq/L (ref 3.7–5.3)
Sodium: 145 mEq/L (ref 137–147)
Total Protein: 5.4 g/dL — ABNORMAL LOW (ref 6.0–8.3)

## 2014-09-20 LAB — CBC
HCT: 43.8 % (ref 36.0–46.0)
Hemoglobin: 14.2 g/dL (ref 12.0–15.0)
MCH: 29.5 pg (ref 26.0–34.0)
MCHC: 32.4 g/dL (ref 30.0–36.0)
MCV: 90.9 fL (ref 78.0–100.0)
PLATELETS: 100 10*3/uL — AB (ref 150–400)
RBC: 4.82 MIL/uL (ref 3.87–5.11)
RDW: 15.1 % (ref 11.5–15.5)
WBC: 36.5 10*3/uL — AB (ref 4.0–10.5)

## 2014-09-20 LAB — GLUCOSE, CAPILLARY
Glucose-Capillary: 64 mg/dL — ABNORMAL LOW (ref 70–99)
Glucose-Capillary: 123 mg/dL — ABNORMAL HIGH (ref 70–99)

## 2014-09-20 MED ORDER — DEXTROSE 50 % IV SOLN
INTRAVENOUS | Status: AC
  Administered 2014-09-20: 50 mL
  Filled 2014-09-20: qty 50

## 2014-09-20 MED ORDER — DEXTROSE 50 % IV SOLN: 25.0000 mL | Freq: Once | INTRAVENOUS | Status: AC | PRN

## 2014-09-20 MED ORDER — IPRATROPIUM-ALBUTEROL 0.5-2.5 (3) MG/3ML IN SOLN: 3.0000 mL | RESPIRATORY_TRACT | Status: DC

## 2014-09-20 NOTE — Progress Notes (Signed)
Notified Kirby, NP of patient's BP 78/49, HR 117 this morning. Pt resting comfortably.

## 2014-09-20 NOTE — Progress Notes (Signed)
Patients daughter has asked if she can admin water to pt.  After assessing pt I informed daughter it would not be safe. The pt is not responding to commands, only opens eyes intermitently.  No intentional movement of extremities. Unable to hold up head.  Cleansed mouth and swabbed .  Will cont to assess.

## 2014-09-20 NOTE — H&P (Signed)
Date: 09/20/2014               Patient Name:  Lynford HumphreyMargaret L Koloski MRN: [id number]  DOB: 11/02/1929 Age / Sex: 80 y.o., female   PCP: Jethro Bastosobert N Koehler, MD         Medical Service: Internal Medicine Teaching Service         Attending Physician: Dr. Earl LagosNischal Narendra, MD    First Contact: Dr. Beckie Saltsivet Pager: [phone]  Second Contact: Dr. Andrey CampanileWilson Pager: [phone]       After Hours (After 5p/  First Contact Pager: [phone]  weekends / holidays): Second Contact Pager: [phone]   Chief Complaint: Unresponsive  History of Present Illness:  [name] is an 80 year old woman with PMH of dementia, HTN, hx of DVT of PE, hx of subarachnoid hemorrhage, asthma, who presents with altered mental status. The patient is nonverbal and most of the information here comes from chart review and from family. Per Clydie BraunKaren, her daughter and HPOA, who was at her bedside, her mother was at her baseline, eating, conversant with a few words, able to transfer from bed to chair until the morning of her hospitalization. Around 5AM yesterday she noticed that her mother had night sweats and was "wet" all over. Her daughter started to change her mother's clothing but the patient started vomiting non bloody emesis and had one BM of soft stools. The patient had another episode of non bloody emesis and her daughter called Dr. Willeen NieceKoheler, her mother's PCP, and made an appointment for 1:30PM. At the PCP, the patient was found to have low blood pressure, and was sent to the ED. At the ED she found to be in septic shock with aspiration pneumonia as the likely source of sepsis. The critical care team was called and had extensive conversation with the family and HPOA in regards to goals of care. The family decided that they did no want aggressive measures, per the patient's wishes, and opted for comfort care with DNR/DNI orders but with IV antibiotic use. The patient had been admitted to the [facility] and the IMTS was called this morning  to assume care of [name] since she is a [facility] patient.   Overnight [name] has remained afebrile but has had low blood pressure with SBP in the 90-60s range and, mild tachycardia, and oxygen requirement with 98% pulse oxymetry with 4L oxygen per nasal canula. She remains non verbal, unresponsive, but appears comfortable with no facial grimace and no increased work of breathing. Her daughter, Clydie BraunKaren, remains at her bedside.      Current Outpatient Prescriptions on File Prior to Encounter  Medication Sig Dispense Refill  . acetaminophen (TYLENOL) 500 MG tablet Take 1,000 mg by mouth 2 (two) times daily.       Marland Kitchen. albuterol (PROVENTIL) (2.5 MG/3ML) 0.083% nebulizer solution Take 2.5 mg by nebulization every 6 (six) hours as needed. For shortness of breath      . Ascorbic Acid (VITAMIN C) 500 MG tablet Take 500 mg by mouth as needed (for congestion).       Marland Kitchen. azelastine (OPTIVAR) 0.05 % ophthalmic solution Place 1 drop into both eyes 2 (two) times daily.        . budesonide (PULMICORT) 0.5 MG/2ML nebulizer solution Take 0.5 mg by nebulization 2 (two) times daily as needed. wheezing      . calcium carbonate (TUMS - DOSED IN MG ELEMENTAL CALCIUM) 500 MG chewable tablet Chew 1 tablet by mouth daily.       .Marland Kitchen  Dextromethorphan-Guaifenesin (Q-TUSSIN DM) 10-100 MG/5ML liquid Take 10 mLs by mouth every 6 (six) hours as needed (for cough/ congestion).      . fluticasone (FLONASE) 50 MCG/ACT nasal spray Place 2 sprays into the nose 2 (two) times daily as needed. For congestion and runny nose      . magnesium hydroxide (MILK OF MAGNESIA) 400 MG/5ML suspension Take 15 mLs by mouth daily as needed for mild constipation.      . memantine (NAMENDA) 10 MG tablet Take 10 mg by mouth 2 (two) times daily.        . montelukast (SINGULAIR) 10 MG tablet Take 10 mg by mouth at bedtime.        . Multiple Vitamins-Minerals (MULTIVITAMINS THER. W/MINERALS) TABS Take 1 tablet by mouth daily.         Meds: Current  Facility-Administered Medications  Medication Dose Route Frequency Provider Last Rate Last Dose  . 0.9 %  sodium chloride infusion   Intravenous Continuous Yevonne PaxSaadat A Khan, MD 50 mL/hr at 09/19/14 2030    . acetaminophen (TYLENOL) tablet 650 mg  650 mg Oral Q6H PRN Yevonne PaxSaadat A Khan, MD       Or  . acetaminophen (TYLENOL) suppository 650 mg  650 mg Rectal Q6H PRN Yevonne PaxSaadat A Khan, MD      . albuterol (PROVENTIL) (2.5 MG/3ML) 0.083% nebulizer solution 2.5 mg  2.5 mg Nebulization Q6H PRN Yevonne PaxSaadat A Khan, MD      . alum & mag hydroxide-simeth (MAALOX/MYLANTA) 200-200-20 MG/5ML suspension 30 mL  30 mL Oral Q6H PRN Yevonne PaxSaadat A Khan, MD      . antiseptic oral rinse (CPC / CETYLPYRIDINIUM CHLORIDE 0.05%) solution 7 mL  7 mL Mouth Rinse BID Yevonne PaxSaadat A Khan, MD      . budesonide (PULMICORT) nebulizer solution 0.5 mg  0.5 mg Nebulization BID Yevonne PaxSaadat A Khan, MD   0.5 mg at 09/19/14 2140  . fluticasone (FLONASE) 50 MCG/ACT nasal spray 1 spray  1 spray Each Nare BID PRN Yevonne PaxSaadat A Khan, MD      . folic acid (FOLVITE) tablet 1 mg  1 mg Oral Daily Yevonne PaxSaadat A Khan, MD   1 mg at 09/19/14 2307  . heparin injection 5,000 Units  5,000 Units Subcutaneous 3 times per day Yevonne PaxSaadat A Khan, MD   5,000 Units at 09/20/14 [phone]  . ketotifen (ZADITOR) 0.025 % ophthalmic solution 1 drop  1 drop Both Eyes BID Yevonne PaxSaadat A Khan, MD   1 drop at 09/20/14 0056  . memantine (NAMENDA) tablet 10 mg  10 mg Oral BID Yevonne PaxSaadat A Khan, MD   10 mg at 09/19/14 2303  . montelukast (SINGULAIR) tablet 10 mg  10 mg Oral QHS Yevonne PaxSaadat A Khan, MD   10 mg at 09/19/14 2306  . morphine 2 MG/ML injection 2 mg  2 mg Intravenous Q3H PRN Yevonne PaxSaadat A Khan, MD      . multivitamin with minerals tablet 1 tablet  1 tablet Oral Daily Yevonne PaxSaadat A Khan, MD      . pantoprazole (PROTONIX) EC tablet 80 mg  80 mg Oral Daily Yevonne PaxSaadat A Khan, MD      . piperacillin-tazobactam (ZOSYN) IVPB 2.25 g  2.25 g Intravenous 3 times per day Fayne NorrieJessica Brown Millen, RPH   2.25 g at 09/20/14 [id number]  . predniSONE (DELTASONE)  tablet 5 mg  5 mg Oral Q breakfast Yevonne PaxSaadat A Khan, MD      . thiamine (VITAMIN B-1) tablet 100 mg  100 mg Oral Daily Saadat  Crista Luria, MD      . Melene Muller ON 09/21/2014] vancomycin (VANCOCIN) IVPB 1000 mg/200 mL premix  1,000 mg Intravenous Q48H Fayne Norrie, [facility]        Allergies: Allergies as of 09/19/2014  . (No Known Allergies)   Past Medical History  Diagnosis Date  . Asthma   . Hypertension   . Osteoporosis   . GERD (gastroesophageal reflux disease)   . Pulmonary embolism     hx of 4/09, sees Dr. [name]   . Leg edema   . DVT (deep venous thrombosis)     "?just one side"  . Pneumonia ?X1  . Asthmatic bronchitis   . Arthritis     "back mostly" (09/19/2014)  . Kidney stones   . Frequent UTI     "when she was younger" (09/19/2014)  . Dementia     "moderate to [place]; she talks to [place] in the family" (09/19/2014)   Past Surgical History  Procedure Laterality Date  . Cholecystectomy    . Lithotripsy    . Knee arthroscopy Right   . Hip fracture surgery Left   . Fracture surgery    . Carpal tunnel release Right   . Dilation and curettage of uterus     Family History  Problem Relation Age of Onset  . Arthritis Other   . Hypertension Other   . Cancer Other     lung   History   Social History  . Marital Status: Single    Spouse Name: N/A    Number of Children: N/A  . Years of Education: N/A   Occupational History  . Not on file.   Social History Main Topics  . Smoking status: Never Smoker   . Smokeless tobacco: Never Used  . Alcohol Use: No  . Drug Use: No  . Sexual Activity: No   Other Topics Concern  . Not on file   Social History Narrative  . No narrative on file    Review of Systems: Review of systems not obtained due to patient factors. The patient is unresponsive  Physical Exam: Blood pressure 80/48, pulse 117, temperature 98 F (36.7 C), temperature source Oral, resp. rate 20, height 4\' 11"  (1.499 m), weight 145 lb 9.6 oz (66.044 kg), SpO2  98.00%. Vitals reviewed. General: resting in bed, in NAD HEENT: PERRL, no scleral icterus, tracts with her eyes but does not open her eyes voluntarily. Oropharynx could not be examined as pt resisted passive motion to open her mouth  Cardiac: mild tachycardia, no rubs, murmurs or gallops appreciated Pulm: Diminished breath sounds over lung bases and anteriorly no wheezes, rales, or rhonchi Abd: soft, nontender, nondistended, BS present Ext: warm and well perfused, 1+ pitting edema over her hands, arms, and bilateral LE up to her knees. Bilateral calves symmetric with no erythema or palpable cord.  GU: Exam deferred, Foley catheter in place Neuro: Unresponsive, does not follow commands, normal tone in UE and LE, no posturing, no focal weakness, no facial assymetry.   Lab results: Basic Metabolic Panel:  Recent Labs  16/10/96 1651 09/20/14 0600  NA 141 145  K 3.4* 4.1  CL 103 111  CO2 19 15*  GLUCOSE 112* 52*  BUN 18 22  CREATININE 2.52* 2.53*  CALCIUM 9.4 8.0*   Liver Function Tests:  Recent Labs  09/19/14 1651 09/20/14 0600  AST 28 80*  ALT 17 47*  ALKPHOS 99 97  BILITOT 0.8 1.1  PROT 6.0 5.4*  ALBUMIN 2.7* 2.4*  Recent Labs  09/19/14 1720  AMMONIA 40   CBC:  Recent Labs  09/19/14 1651 09/20/14 0600  WBC 18.7* PENDING  NEUTROABS 17.6*  --   HGB 14.4 14.2  HCT 46.3* 43.8  MCV 93.3 90.9  PLT 92* PENDING   BNP:  Recent Labs  09/19/14 1651  PROBNP 6958.0*   CBG:  Recent Labs  09/19/14 1701 09/20/14 0754  GLUCAP 110* 64*   Urine Drug Screen: Drugs of Abuse     Component Value Date/Time   LABOPIA NONE DETECTED 09/19/2014 1705   COCAINSCRNUR NONE DETECTED 09/19/2014 1705   LABBENZ NONE DETECTED 09/19/2014 1705   AMPHETMU NONE DETECTED 09/19/2014 1705   THCU NONE DETECTED 09/19/2014 1705   LABBARB NONE DETECTED 09/19/2014 1705    Urinalysis:  Recent Labs  09/19/14 1705  COLORURINE AMBER*  LABSPEC 1.018  PHURINE 6.0  GLUCOSEU  NEGATIVE  HGBUR LARGE*  BILIRUBINUR SMALL*  KETONESUR 15*  PROTEINUR 100*  UROBILINOGEN 1.0  NITRITE NEGATIVE  LEUKOCYTESUR LARGE*    Imaging results:  Ct Head Wo Contrast  09/19/2014   CLINICAL DATA:  Altered mental status, hypotension  EXAM: CT HEAD WITHOUT CONTRAST  TECHNIQUE: Contiguous axial images were obtained from the base of the skull through the vertex without intravenous contrast.  COMPARISON:  11/03/2011  FINDINGS: There is mucosal thickening with air-fluid level bilateral maxillary sinus. Mucosal thickening stress that mild mucosal thickening bilateral ethmoid air cells. The mastoid air cells are unremarkable.  Atherosclerotic calcifications of carotid siphon.  No intracranial hemorrhage, mass effect or midline shift. Periventricular and patchy subcortical chronic white matter disease again noted. Stable cerebral atrophy. Ventricular size is stable from prior exam. No acute cortical infarction. No mass lesion is noted on this unenhanced scan.  IMPRESSION: No acute intracranial abnormality. Stable atrophy and chronic white matter disease. There is mucosal thickening with air-fluid level bilateral maxillary sinus.   Electronically Signed   By: Natasha Mead M.D.   On: 09/19/2014 18:18   Dg Chest Port 1 View  09/19/2014   CLINICAL DATA:  Mental status change. Now all unresponsive now on this morning and home vomiting.  EXAM: PORTABLE CHEST - 1 VIEW  COMPARISON:  Chest x-ray dated July 24, 2012  FINDINGS: The lungs are mildly hypoinflated. There is no focal infiltrate. The interstitial markings are increased. There is no pneumothorax or pleural effusion. The cardiac silhouette mildly enlarged though stable. There is stable tortuosity of the ascending and descending thoracic aorta. The pulmonary vascularity is slightly less distinct today. There are degenerative changes of both shoulders. No acute rib fracture is observed today. There is stable pleural thickening adjacent to the lateral  aspect of the left second rib.  IMPRESSION: The findings suggest low-grade CHF with mild interstitial edema. There is no focal pneumonia.   Electronically Signed   By: David  [place]   On: 09/19/2014 17:28    Other results: EKG: Date/Time: Tuesday September 19 2014 16:14:18 EDT  Ventricular Rate: 97  PR Interval: 131  QRS Duration: 63  QT Interval: 436  QTC Calculation: 554  R Axis: 49  Text Interpretation: Sinus rhythm Low voltage, precordial leads  Borderline T abnormalities, diffuse leads Prolonged QT interval (554). No  significant change since last tracing.   Assessment & Plan by Problem: 80 year old woman with PMH of dementia, asthma, presenting with AMS and septic shock, after vomiting/diarrhea, source of sepsis thought to be likely aspiration pneumonia.   Septic Shock: She came in with leucocytosis of 18K,  hypotension, lactic acid of 9.18, increase in Cr. The source of sepsis is could be due to aspiration PNA though her CXR does not show consolidation. Her UA is concerning for UTI with large leucocytes, lage Hgb, few squamous cells, and many bacteria. She had no complains of urinary symptoms prior to yesterday and had diarrhea only once. Per family, no aggressive measures at this time, comfort care with DNR/DNI orders but they want IV antibiotics. Her BP remains soft, at 80/48 this morning. She received 2L NS bolus in the ED and appears edematous on exam with CXR with mild interstitial edema.  -Continue Vanc and Zosyn per Pharmacy  -Comfort care -f/u urine culture, blood cultures  AMS: CT head negative for acute intracranial abnormalities. Etiology likely due to hypotension sepsis superimposed on her advanced dementia. Not very verbal at baseline but completely nonverbal here, not following commands.  -Continue comfort care: may consider stopping IVF and starting scopolamine patch and/or atropine drops  to decrease secretions, morphine PRN if increased work of breathing,  Respiradone/Ativan PRN for agitation, removing Foley, comfort feeds. -Chaplain care consult  AKI with oliguria: Likely due to sepsis with insensible losses with night sweats and V/D. AKI: Baseline Cr of 1.2, 2.5 on presentation. Received 2L NS bolus in ED with UOP of 550 (has Foley in place).  -Caution with fluids as she appears overloaded per exam -Continue NS 81ml/hr for now but will dc if edema/oxygen requirement worsen  Diastolic CHF: In mild exacerbation with edema per physical exam and in pulmonary edema per CXR. 2D echo in 2010 showed EF of 70%, mild diastolic dysfunction, aortic valve with mild calcification. Not on diuretics at home.  -Continue O2 supplementation  -Judicious use of IVF -Consider Lasix IV if increase in work of breathing  Asthma: Not on home oxygen. On Pulmicort, albuterol nebulizer, Singulair at home. No wheezing on physical exam.  -Discontinue Prednisone for now as she has no wheezing and this may worsen her edema -Continue Pulmicort, SIngulair, albuterol neb -Oxygen supplementation PRN   DVT prophylaxis: Heparin [place] TID  FEN: NSL 1ml/hr Limit lab draws Comfort feeds   Dispo: Disposition is deferred at this time, awaiting improvement of current medical problems. Anticipated discharge in approximately 1-2 day(s).   The patient does have a current PCP Jethro Bastos, MD) and does not need an [facility] hospital follow-up appointment after discharge.  The patient does not have transportation limitations that hinder transportation to clinic appointments.  Signed: Ky Barban, MD PGY3, IMTS 09/20/2014, 8:33 AM

## 2014-09-20 NOTE — H&P (Signed)
Pt seen and examined. Please refer to resident note for details.  In brief, pt is a 80 y/o female with PMH of dementia, HTN, DVT, subarachnoid hemorrhage, asthma who p/w AMS. History obtained from daughter at bedside who states patient normally able to walk a little with assistance and does speak occasionally. Yesterday morning pt noted to be diaphoretic and had 1 episode of n/v. Pt was taken to PCP who noted pt to be hypotensive and pt was sent to ED. In ED pt noted to be in septic shock and goal of care discussion was held with the family and it was decided not to pursue aggressive Rx but to continue with abx. Pt was made DNR/DNI at that time. Pt currently appears comfortable but noted to have persistent hypotension  Exam: BP 80/48, Pulse 117 Gen: NAD, difficult to arouse CV: tachycardic, normal heart sounds Pulm: b/l exp wheeze + Abd: soft, non tender, BS + Ext: No pedal edema  Assessment and Plan: 80 y/o woman p/w AMS found to be in septic shock possibly secondary to aspiration PNA  Septic shock: - Uncertain etiology- possible aspiration PNA v/s UTI  - c/w vanco, zosyn for now - f/u urine cx, blood cx - Leukocytosis worsening and pt with severe lactic acidosis. D/w family who understands poor prognosis - Family does not wish to pursue aggressive measures and pt is now DNR/DNI - Pt still hypotensive. C/w IVF for now.  - Would get palliative care consult  AMS: - likely secondary to septic shock - CT head with no acute path - Will monitor  AKI: - Likely secondary to ATN secondary to hypotension - Will monitor on IVF for now  Asthma: - Pt with wheezing when I examined her - c/w nebs, pulmicort and singulair  Diastolic CHF: - Would monitor resp status on IVF - If worsening pulm congestion will consider lasix and decreasing IVF

## 2014-09-20 NOTE — Progress Notes (Signed)
Utilization review completed.  

## 2014-09-20 NOTE — Progress Notes (Signed)
Hypoglycemic Event  CBG:64  Treatment: D50 IV 25 mL  Symptoms: Sweaty  Follow-up CBG: [id number] CBG Result:120 Possible Reasons for Event: pt has been non responsive, not awake  Comments/MD notified:will notify md with bmet results     Laray Angerodriguez, Turki Tapanes Ann  Remember to initiate Hypoglycemia Order Set & complete

## 2014-09-20 NOTE — Progress Notes (Signed)
PT was assessed by respiratory therapy, no wheezing noted, uses breathing treatments as needed at home.  Pt's family states that they do not want her bothered that often. Breathing treatments are as needed now and that was explained to family and they will call when needed.

## 2014-09-20 NOTE — Progress Notes (Signed)
CARE MANAGEMENT NOTE 09/20/2014  Patient:  [name]   Account Number:  [id number]  Date Initiated:  09/20/2014  Documentation initiated by:  Letha CapeAYLOR,[name]  Subjective/Objective Assessment:   dx ams  admit- lives with daughter.     Action/Plan:   Anticipated DC Date:  09/22/2014   Anticipated DC Plan:  HOME W HOME HEALTH SERVICES      DC Planning Services  CM consult      Choice offered to / List presented to:             Status of service:  In process, will continue to follow Medicare Important Message given?  NA - LOS <3 / Initial given by admissions (If response is "NO", the following Medicare IM given date fields will be blank) Date Medicare IM given:   Medicare IM given by:   Date Additional Medicare IM given:   Additional Medicare IM given by:    Discharge Disposition:    Per UR Regulation:  Reviewed for med. necessity/level of care/duration of stay  If discussed at Long Length of Stay Meetings, dates discussed:    Comments:  09/20/14 2036 Letha Capeeborah Markisha Meding RN, BSN [phone] patient lives with daughter, NCM will continue to follow for dc needs.

## 2014-09-20 NOTE — Consult Note (Signed)
Patient [name]      DOB: Jan 12, 1929      UJW:[id number]     Consult Note from the Palliative Medicine Team at [facility]    Consult Requested by: Dr Garald Braver     PCP: Thane Edu, MD Reason for Consultation: Clarification of GOC and options     Phone Number:[phone]  Assessment of patients Current state:   Continued physical, functional and cognitive decline 2/2 to advanced dementia.  Family faced with advanced directive decisions and anticipatory care needs.  Consult is for review of medical treatment options, clarification of goals of care and end of life issues, disposition and options, and symptom recommendation.  This NP Lorinda Creed reviewed medical records, received report from team, assessed the patient and then meet at the patient's bedside along with her daughter [name] and her son [name]  to discuss diagnosis prognosis, GOC, EOL wishes disposition and options.  A detailed discussion was had today regarding advanced directives.  Concepts specific to code status, artifical feeding and hydration, continued IV antibiotics and rehospitalization was had.  The difference between a aggressive medical intervention path  and a palliative comfort care path for this patient at this time was had.  Values and goals of care important to patient and family were attempted to be elicited.  Concept of Hospice and Palliative Care were discussed  Natural trajectory and expectations at EOL were discussed.  Questions and concerns addressed.  Hard Choices booklet left for review. Family encouraged to call with questions or concerns.  PMT will continue to support holistically.   Goals of Care: 1.  Code Status:  DNR/DNI   2. Scope of Treatment: 1. Vital Signs: per unit 2. Respiratory/Oxygen: as needed for comfort 3. Nutritional Support/Tube Feeds: no artificial feeding now or in the future 4. Antibiotics: yes 5. IVF: yes 6. Labs: yes   3. Disposition:  Home  with PACE services when medically stable   4. Symptom Management:   1. Weakness/ Failure to thrive: medical management of treatable illness 2. Dysphagia: comfort feeds with known risk of aspiration 3. Pain/Fever:  Tylenol 650 mg every 6 hrs prn  5. Psychosocial: Emotional support  Patient Documents Completed or Given: Document Given Completed  Advanced Directives Pkt    MOST X   DNR X   Gone from My Sight    Hard Choices      Brief HPI: [name] is an 80 year old woman with PMH of dementia, HTN, hx of DVT of PE, hx of subarachnoid hemorrhage, asthma, who presents with altered mental status. The patient is nonverbal and most of the information here comes from chart review and from family. Per [name], her daughter and HPOA, who was at her bedside, her mother was at her baseline, eating, conversant with a few words, able to transfer from bed to chair until the morning of her hospitalization. Around 5AM yesterday she noticed that her mother had night sweats and was "wet" all over. Her daughter started to change her mother's clothing but the patient started vomiting non bloody emesis and had one BM of soft stools. The patient had another episode of non bloody emesis and her daughter called Dr. Willeen [name], her mother's PCP, and made an appointment for 1:30PM. At the PCP, the patient was found to have low blood pressure, and was sent to the ED. At the ED she found to be in septic shock with aspiration pneumonia as the likely source of sepsis. The critical care team was  called and had extensive conversation with the family and HPOA in regards to goals of care. The family decided that they did no want aggressive measures, per the patient's wishes, and opted for comfort care with DNR/DNI orders but with IV antibiotic use. The patient had been admitted to the [facility] and the IMTS was called this morning to assume care of [name] since she is a PACE patient.  Overnight [name] has  remained afebrile but has had low blood pressure with SBP in the 90-60s range and, mild tachycardia, and oxygen requirement with 98% pulse oxymetry with 4L oxygen per nasal canula. She remains non verbal,  Minimally responsive, but appears comfortable with no facial grimace and no increased work of breathing. Her daughter, [name], is at her bedside.   PMH for ES dementia with continued physical, functional and cognitive decline.  Per PACE staff  patient  Is non verbal, bed bound, incontinent  and total care at baseline.   ROS:  unble to illicit   PMH:  Past Medical History  Diagnosis Date  . Asthma   . Hypertension   . Osteoporosis   . GERD (gastroesophageal reflux disease)   . Pulmonary embolism     hx of 4/09, sees Dr. Sherene SiresWert   . Leg edema   . DVT (deep venous thrombosis)     "?just one side"  . Pneumonia ?X1  . Asthmatic bronchitis   . Arthritis     "back mostly" (09/19/2014)  . Kidney stones   . Frequent UTI     "when she was younger" (09/19/2014)  . Dementia     "moderate to us; she talks to us in the family" (09/19/2014)     PSH: Past Surgical History  Procedure Laterality Date  . Cholecystectomy    . Lithotripsy    . Knee arthroscopy Right   . Hip fracture surgery Left   . Fracture surgery    . Carpal tunnel release Right   . Dilation and curettage of uterus     I have reviewed the FH and SH and  If appropriate update it with new information. No Known Allergies Scheduled Meds: . antiseptic oral rinse  7 mL Mouth Rinse BID  . budesonide  0.5 mg Nebulization BID  . heparin  5,000 Units Subcutaneous 3 times per day  . ipratropium-albuterol  3 mL Nebulization Q4H  . ketotifen  1 drop Both Eyes BID  . memantine  10 mg Oral BID  . montelukast  10 mg Oral QHS  . pantoprazole  80 mg Oral Daily  . piperacillin-tazobactam (ZOSYN)  IV  2.25 g Intravenous 3 times per day  . [START ON 09/21/2014] vancomycin  1,000 mg Intravenous Q48H   Continuous Infusions: . sodium  chloride 50 mL/hr at 09/19/14 2030   PRN Meds:.acetaminophen, acetaminophen, albuterol, dextrose, fluticasone, morphine injection    BP 80/48  Pulse 117  Temp(Src) 98 F (36.7 C) (Oral)  Resp 20  Ht 4\' 11"  (1.499 m)  Wt 66.044 kg (145 lb 9.6 oz)  BMI 29.39 kg/m2  SpO2 98%   PPS: 30 % at best   Intake/Output Summary (Last 24 hours) at 09/20/14 1219 Last data filed at 09/20/14 0950  Gross per 24 hour  Intake      0 ml  Output    560 ml  Net   -560 ml   LBM:  09-20-14  Physical Exam:  General: chronically ill appearing, NAD HEENT:  Moist buccal membranes Chest:   Decreased in bases, CTA CVS: tachycardic Abdomen:soft NT +BS Ext: without edema Neuro:open eyes to stimulation, non-verbal  Labs: CBC    Component Value Date/Time   WBC 36.5* 09/20/2014 0600   RBC 4.82 09/20/2014 0600   RBC 4.16 03/26/2008 0530   HGB 14.2 09/20/2014 0600   HCT 43.8 09/20/2014 0600   PLT 100* 09/20/2014 0600   MCV 90.9 09/20/2014 0600   MCH 29.5 09/20/2014 0600   MCHC 32.4 09/20/2014 0600   RDW 15.1 09/20/2014 0600   LYMPHSABS 0.7 09/19/2014 1651   MONOABS 0.4 09/19/2014 1651   EOSABS 0.0 09/19/2014 1651   BASOSABS 0.0 09/19/2014 1651    BMET    Component Value Date/Time   NA 145 09/20/2014 0600   K 4.1 09/20/2014 0600   CL 111 09/20/2014 0600   CO2 15* 09/20/2014 0600   GLUCOSE 52* 09/20/2014 0600   GLUCOSE 94 10/26/2006 1653   BUN 22 09/20/2014 0600   CREATININE 2.53* 09/20/2014 0600   CALCIUM 8.0* 09/20/2014 0600   GFRNONAA 16* 09/20/2014 0600   GFRAA 19* 09/20/2014 0600    CMP     Component Value Date/Time   NA 145 09/20/2014 0600   K 4.1 09/20/2014 0600   CL 111 09/20/2014 0600   CO2 15* 09/20/2014 0600   GLUCOSE 52* 09/20/2014 0600   GLUCOSE 94 10/26/2006 1653   BUN 22 09/20/2014 0600   CREATININE 2.53* 09/20/2014 0600   CALCIUM 8.0* 09/20/2014 0600   PROT 5.4* 09/20/2014 0600   ALBUMIN 2.4* 09/20/2014 0600   AST 80* 09/20/2014  0600   ALT 47* 09/20/2014 0600   ALKPHOS 97 09/20/2014 0600   BILITOT 1.1 09/20/2014 0600   GFRNONAA 16* 09/20/2014 0600   GFRAA 19* 09/20/2014 0600    Time In Time Out Total Time Spent with Patient Total Overall Time  1030 1145 70 min 75 min    Greater than 50%  of this time was spent counseling and coordinating care related to the above assessment and plan.   Lorinda CreedMary Massimiliano Rohleder NP  Palliative Medicine Team Team Phone # [phone] Pager [phone]  Discussed with Dr Modena JanskyKohler (PACE) Carmon SailsEmily Scearce LCSW [facility] # [phone]

## 2014-09-20 NOTE — Progress Notes (Signed)
Chaplain visited with pt and family responding to consult. Pt's daughter and other family members bedside while patient was sleeping. Daughter is pt's caretaker. Daughter expressed that she is aware of the prognosis and takes it seriously, she is worried about if she  Made the right decision with the DNR, etc. Daughter finds comfort in spiritual practices mentioning prayer and yoga. Chaplain explored her hope for her mother and facilitated life review finding joy in moments she'd had with her mother as she's been her primary caretaker for some time now. Daughter expressed healing moments in her own life and in her mother's life while embracing positive possibilities.   Page if needed  [name], [name], Chaplain 09/20/2014 [phone]

## 2014-09-20 NOTE — Progress Notes (Signed)
Pts CBG 64. Pt has been mostly non responsive thru the night, will open eyes to familys encouragment.  1/2amp d50 via iv administered for low blood sugar.  Will cont to assess.

## 2014-09-21 DIAGNOSIS — N390 Urinary tract infection, site not specified: Secondary | ICD-10-CM

## 2014-09-21 DIAGNOSIS — R531 Weakness: Secondary | ICD-10-CM

## 2014-09-21 DIAGNOSIS — Z515 Encounter for palliative care: Secondary | ICD-10-CM

## 2014-09-21 DIAGNOSIS — Z66 Do not resuscitate: Secondary | ICD-10-CM

## 2014-09-21 LAB — COMPREHENSIVE METABOLIC PANEL
ALK PHOS: 120 U/L — AB (ref 39–117)
ALT: 42 U/L — AB (ref 0–35)
AST: 60 U/L — AB (ref 0–37)
Albumin: 2.2 g/dL — ABNORMAL LOW (ref 3.5–5.2)
Anion gap: 14 (ref 5–15)
BILIRUBIN TOTAL: 1.2 mg/dL (ref 0.3–1.2)
BUN: 27 mg/dL — ABNORMAL HIGH (ref 6–23)
CHLORIDE: 114 meq/L — AB (ref 96–112)
CO2: 17 meq/L — AB (ref 19–32)
Calcium: 8.2 mg/dL — ABNORMAL LOW (ref 8.4–10.5)
Creatinine, Ser: 2.34 mg/dL — ABNORMAL HIGH (ref 0.50–1.10)
GFR calc Af Amer: 21 mL/min — ABNORMAL LOW (ref >90)
GFR, EST NON AFRICAN AMERICAN: 18 mL/min — AB (ref >90)
Glucose, Bld: 79 mg/dL (ref 70–99)
POTASSIUM: 4.1 meq/L (ref 3.7–5.3)
Sodium: 145 mEq/L (ref 137–147)
Total Protein: 5 g/dL — ABNORMAL LOW (ref 6.0–8.3)

## 2014-09-21 LAB — CBC
HCT: 38.6 % (ref 36.0–46.0)
Hemoglobin: 12.4 g/dL (ref 12.0–15.0)
MCH: 29 pg (ref 26.0–34.0)
MCHC: 32.1 g/dL (ref 30.0–36.0)
MCV: 90.2 fL (ref 78.0–100.0)
PLATELETS: 82 10*3/uL — AB (ref 150–400)
RBC: 4.28 MIL/uL (ref 3.87–5.11)
RDW: 15.2 % (ref 11.5–15.5)
WBC: 30.7 10*3/uL — AB (ref 4.0–10.5)

## 2014-09-21 LAB — GLUCOSE, CAPILLARY
GLUCOSE-CAPILLARY: 52 mg/dL — AB (ref 70–99)
Glucose-Capillary: 68 mg/dL — ABNORMAL LOW (ref 70–99)

## 2014-09-21 MED ORDER — ENSURE COMPLETE PO LIQD: 237.0000 mL | Freq: Two times a day (BID) | ORAL | Status: DC

## 2014-09-21 NOTE — Progress Notes (Signed)
Subjective: Patient still not opening her eyes and nonverbal. Daughter notes she was able to eat her applesauce this morning, swallowing fine with no cough. Patient's BP improved to 128/69 this AM. WBC count improved from 36 to 30.   Objective: Vital signs in last 24 hours: Filed Vitals:   09/21/14 0102 09/21/14 0512 09/21/14 1100 09/21/14 1308  BP: 106/63 128/69  137/53  Pulse: 90 85 90 64  Temp: 98.9 F (37.2 C) 98.6 F (37 C)  98.5 F (36.9 C)  TempSrc: Oral Oral  Oral  Resp: 18 15 18 18   Height:      Weight:      SpO2: 100% 100% 99% 99%   Weight change:   Intake/Output Summary (Last 24 hours) at 09/21/14 1401 Last data filed at 09/21/14 1359  Gross per 24 hour  Intake 2344.17 ml  Output    600 ml  Net 1744.17 ml   Physical Exam General: resting in bed, NAD HEENT: [place]/AT, sclera anicteric, unable to assess oral pharynx CV: RRR, normal S1/S2, no m/g/r Pulm: unable to assess posterior breath sounds. Anteriorly no wheezes, rhonchi, or rales auscultated. Breathing comfortably Abd: BS+, soft, non-tender Ext: warm, trace edema Neuro: unresponsive, no facial asymmetry, no posturing  Lab Results: Basic Metabolic Panel:  Recent Labs Lab 09/20/14 0600 09/21/14 1050  NA 145 145  K 4.1 4.1  CL 111 114*  CO2 15* 17*  GLUCOSE 52* 79  BUN 22 27*  CREATININE 2.53* 2.34*  CALCIUM 8.0* 8.2*   Liver Function Tests:  Recent Labs Lab 09/20/14 0600 09/21/14 1050  AST 80* 60*  ALT 47* 42*  ALKPHOS 97 120*  BILITOT 1.1 1.2  PROT 5.4* 5.0*  ALBUMIN 2.4* 2.2*   No results found for this basename: LIPASE, AMYLASE,  in the last 168 hours  Recent Labs Lab 09/19/14 1720  AMMONIA 40   CBC:  Recent Labs Lab 09/19/14 1651 09/20/14 0600 09/21/14 1050  WBC 18.7* 36.5* 30.7*  NEUTROABS 17.6*  --   --   HGB 14.4 14.2 12.4  HCT 46.3* 43.8 38.6  MCV 93.3 90.9 90.2  PLT 92* 100* 82*   Cardiac Enzymes: No results found for this basename: CKTOTAL, CKMB, CKMBINDEX,  TROPONINI,  in the last 168 hours BNP:  Recent Labs Lab 09/19/14 1651  PROBNP 6958.0*   D-Dimer: No results found for this basename: DDIMER,  in the last 168 hours CBG:  Recent Labs Lab 09/19/14 1701 09/20/14 0754 09/20/14 0832 09/21/14 0749 09/21/14 0819  GLUCAP 110* 64* 123* 52* 68*   Hemoglobin A1C: No results found for this basename: HGBA1C,  in the last 168 hours Fasting Lipid Panel: No results found for this basename: CHOL, HDL, LDLCALC, TRIG, CHOLHDL, LDLDIRECT,  in the last 168 hours Thyroid Function Tests: No results found for this basename: TSH, T4TOTAL, FREET4, T3FREE, THYROIDAB,  in the last 168 hours Coagulation: No results found for this basename: LABPROT, INR,  in the last 168 hours Anemia Panel: No results found for this basename: VITAMINB12, FOLATE, FERRITIN, TIBC, IRON, RETICCTPCT,  in the last 168 hours Urine Drug Screen: Drugs of Abuse     Component Value Date/Time   LABOPIA NONE DETECTED 09/19/2014 1705   COCAINSCRNUR NONE DETECTED 09/19/2014 1705   LABBENZ NONE DETECTED 09/19/2014 1705   AMPHETMU NONE DETECTED 09/19/2014 1705   THCU NONE DETECTED 09/19/2014 1705   LABBARB NONE DETECTED 09/19/2014 1705    Alcohol Level: No results found for this basename: ETH,  in the  last 168 hours Urinalysis:  Recent Labs Lab 09/19/14 1705  COLORURINE AMBER*  LABSPEC 1.018  PHURINE 6.0  GLUCOSEU NEGATIVE  HGBUR LARGE*  BILIRUBINUR SMALL*  KETONESUR 15*  PROTEINUR 100*  UROBILINOGEN 1.0  NITRITE NEGATIVE  LEUKOCYTESUR LARGE*    Micro Results: Recent Results (from the past 240 hour(s))  CULTURE, BLOOD (ROUTINE X 2)     Status: None   Collection Time    09/19/14  4:51 PM      Result Value Ref Range Status   Specimen Description BLOOD RIGHT FOREARM   Final   Special Requests BOTTLES DRAWN AEROBIC AND ANAEROBIC 5CC   Final   Culture  Setup Time     Final   Value: 09/19/2014 22:20     Performed at [facility]   Culture     Final    Value:        BLOOD CULTURE RECEIVED NO GROWTH TO DATE CULTURE WILL BE HELD FOR 5 DAYS BEFORE ISSUING A FINAL NEGATIVE REPORT     Performed at [facility]   Report Status PENDING   Incomplete  URINE CULTURE     Status: None   Collection Time    09/19/14  5:05 PM      Result Value Ref Range Status   Specimen Description URINE, CLEAN CATCH   Final   Special Requests NONE   Final   Culture  Setup Time     Final   Value: 09/19/2014 22:28     Performed at [facility] Count     Final   Value: >=100,000 COLONIES/ML     Performed at [facility]   Culture     Final   Value: GRAM NEGATIVE RODS     Performed at [facility]   Report Status PENDING   Incomplete   Studies/Results: Ct Head Wo Contrast  09/19/2014   CLINICAL DATA:  Altered mental status, hypotension  EXAM: CT HEAD WITHOUT CONTRAST  TECHNIQUE: Contiguous axial images were obtained from the base of the skull through the vertex without intravenous contrast.  COMPARISON:  11/03/2011  FINDINGS: There is mucosal thickening with air-fluid level bilateral maxillary sinus. Mucosal thickening stress that mild mucosal thickening bilateral ethmoid air cells. The mastoid air cells are unremarkable.  Atherosclerotic calcifications of carotid siphon.  No intracranial hemorrhage, mass effect or midline shift. Periventricular and patchy subcortical chronic white matter disease again noted. Stable cerebral atrophy. Ventricular size is stable from prior exam. No acute cortical infarction. No mass lesion is noted on this unenhanced scan.  IMPRESSION: No acute intracranial abnormality. Stable atrophy and chronic white matter disease. There is mucosal thickening with air-fluid level bilateral maxillary sinus.   Electronically Signed   By: Natasha Mead M.D.   On: 09/19/2014 18:18   Dg Chest Port 1 View  09/19/2014   CLINICAL DATA:  Mental status change. Now all unresponsive now on this morning and home vomiting.  EXAM:  PORTABLE CHEST - 1 VIEW  COMPARISON:  Chest x-ray dated July 24, 2012  FINDINGS: The lungs are mildly hypoinflated. There is no focal infiltrate. The interstitial markings are increased. There is no pneumothorax or pleural effusion. The cardiac silhouette mildly enlarged though stable. There is stable tortuosity of the ascending and descending thoracic aorta. The pulmonary vascularity is slightly less distinct today. There are degenerative changes of both shoulders. No acute rib fracture is observed today. There is stable pleural thickening adjacent to the lateral aspect  of the left second rib.  IMPRESSION: The findings suggest low-grade CHF with mild interstitial edema. There is no focal pneumonia.   Electronically Signed   By: David  Swazil[place]   On: 09/19/2014 17:28   Medications: I have reviewed the patient's current medications. Scheduled Meds: . antiseptic oral rinse  7 mL Mouth Rinse BID  . budesonide  0.5 mg Nebulization BID  . heparin  5,000 Units Subcutaneous 3 times per day  . ketotifen  1 drop Both Eyes BID  . memantine  10 mg Oral BID  . montelukast  10 mg Oral QHS  . pantoprazole  80 mg Oral Daily  . piperacillin-tazobactam (ZOSYN)  IV  2.25 g Intravenous 3 times per day  . vancomycin  1,000 mg Intravenous Q48H   Continuous Infusions: . sodium chloride 50 mL/hr at 09/20/14 2110   PRN Meds:.acetaminophen, acetaminophen, albuterol, fluticasone, morphine injection Assessment/Plan: [name] is a 80yo woman w/ PMHx of dementia and asthma who presented to the ED with altered mental status and septic shock likely 2/2 UTI vs. Aspiration pneumonia.  1. Septic Shock 2/2 UTI vs. Aspiration Pneumonia: Urinalysis shows large leukocytes. Urine culture growing GNR. Blood cultures show no growth. WBC count slightly improved today 36>30. She has been afebrile, but elderly sometimes unable to mount fever due to immunosuppression. Her BP has been stable in 120-130s this morning while on IVF. CXR  showed only mild interstitial edema. Sepsis thought to be due to UTI vs. Aspiration pneumonia. Will check CXR tomorrow morning to see if pneumonia apparent now she has been hydrated. Vanc and Zosyn were started yesterday (10/20).  - Continue Vanc and Zosyn - Comfort care - CXR tomorrow morning - CBC and CMP in AM  2. AMS: Likely 2/2 septic shock. Patient is still unresponsive and nonverbal. Her work of breathing appears less labored and she appears comfortable on exam.  - Comfort feeds - Continue to monitor mental status - Chaplain consulted  3. AKI: Cr 2.53 on admission, now slightly improved to 2.34. Baseline 1.2. AKI most likely due to sepsis.  - Continue IVF @ 50 ml/hr  4. Diastolic CHF: Patient has trace edema on exam. She is on NS @ 50 ml/hr to maintain her BP.  - Continue low fluids - Continue oxygen therapy  5. Asthma: Continues to have no wheezing on physical exam. She is on Albuterol nebs Q6H PRN wheezing and Pulmicort nebs BID. She is breathing comfortably on 4 L oxygen via [place] and saturating at 99-100%. - Continue Albuterol and Pulmicort nebs - Oxygen therapy as needed  Diet: DYS 3 DVT/PE PPx: Heparin SQ Dispo: Disposition is deferred at this time, awaiting improvement of current medical problems.    The patient does have a current PCP Jethro Bastos(Robert N Koehler, MD) and [facility].  The patient does not have transportation limitations that hinder transportation to clinic appointments.  .Services Needed at time of discharge: Y = Yes, Blank = No PT:   OT:   RN:   Equipment:   Other:     LOS: 2 days   Rich Numberarly Rivet, MD 09/21/2014, 2:01 PM

## 2014-09-21 NOTE — Progress Notes (Signed)
Pt seen and examined with Dr. Beckie Saltsivet. Please refer to resident note for details.   Pt still somnolent but opening eyes occasionally and ate some applesauce per daughter at bedside. BP improved today.   Exam:   Gen: NAD, difficult to arouse  CV: RRR, normal heart sounds  Pulm: b/l exp wheeze +  Abd: soft, non tender, BS +  Ext: No pedal edema   Assessment and Plan:  80 y/o woman p/w AMS found to be in septic shock possibly secondary to aspiration PNA  Septic shock:  - Uncertain etiology- possible aspiration PNA v/s UTI  - c/w vanco, zosyn for now  - urine cx + for gram negative rods. Blood cx negative till date - Will consider narrowing abx coverage in AM - Leukocytosis improving slowly (possibly dilutional). Will monitor - Family does not wish to pursue aggressive measures and pt is now DNR/DNI  - c/w IVF @50  cc/hr - Palliative care consult called - Will repeat CXR in AM - reassess for PNA after hydrating patient   AMS:  - likely secondary to septic shock  - CT head with no acute path  - Will monitor   AKI:  - Likely secondary to ATN secondary to hypotension  - Will monitor on IVF. Mildly improved today   Asthma:  - Pt with wheezing   - c/w nebs, pulmicort and singulair   Diastolic CHF:  - Stable. Will monitor

## 2014-09-21 NOTE — Progress Notes (Addendum)
INITIAL NUTRITION ASSESSMENT  DOCUMENTATION CODES Per approved criteria  -Not Applicable   INTERVENTION: Downgrade diet to dysphagia 1.  Encourage PO intake.  NUTRITION DIAGNOSIS: Inadequate oral intake related to difficulty eating as evidenced by meal completion 20%.   Goal: Pt to meet >/= 90% of their estimated nutrition needs   Monitor:  PO intake, weight trends, labs, I/O's  Reason for Assessment: Low Braden Score  80 y.o. female  Admitting Dx: Septic shock  ASSESSMENT: Pt with PMH of dementia, HTN, hx of DVT of PE, hx of subarachnoid hemorrhage, asthma, who presents with altered mental status. At the ED she found to be in septic shock with aspiration pneumonia as the likely source of sepsis.  Pt was nonverbal and unresponsive during time of visit. Unable to obtain nutrition hx. Spoke with RN, she reports pt with with poor po intake. Daughter was able to feed pt this AM. Meal completion is 20%. Daughter requests a puree diet instead as it will be easier for the pt to eat. Will downgrade diet. RN report pt will not likely drink supplements if ordered as pt is not responsive.  Pt with no observed significant fat or muscle mass loss.  Labs: Low Co2, calcium, and GFR. High chloride, BUN, creatinine, ALT and alkaline phosphatase.  Height: Ht Readings from Last 1 Encounters:  09/19/14 4\' 11"  (1.499 m)    Weight: Wt Readings from Last 1 Encounters:  09/19/14 145 lb 9.6 oz (66.044 kg)    Ideal Body Weight: 98 lbs  % Ideal Body Weight: 148%  Wt Readings from Last 10 Encounters:  09/19/14 145 lb 9.6 oz (66.044 kg)  07/24/12 148 lb (67.132 kg)  11/03/11 138 lb 10.7 oz (62.9 kg)  05/29/11 147 lb 3.2 oz (66.769 kg)  05/06/11 147 lb (66.679 kg)  02/11/11 146 lb (66.225 kg)  01/27/11 144 lb 8 oz (65.545 kg)  01/08/11 200 lb (90.719 kg)  12/26/10 149 lb (67.586 kg)  08/14/10 153 lb (69.4 kg)    Usual Body Weight: 148 lbs  % Usual Body Weight: 98%  BMI:  Body  mass index is 29.39 kg/(m^2).  Estimated Nutritional Needs: Kcal: 1650-1850 Protein: 75-85 grams Fluid: 1.65- 1.85 L/day  Skin: +2 generalized edema  Diet Order: Dysphagia 3  EDUCATION NEEDS: -Education not appropriate at this time   Intake/Output Summary (Last 24 hours) at 09/21/14 1502 Last data filed at 09/21/14 1359  Gross per 24 hour  Intake 2344.17 ml  Output    600 ml  Net 1744.17 ml    Last BM: 10/21  Labs:   Recent Labs Lab 09/19/14 1651 09/20/14 0600 09/21/14 1050  NA 141 145 145  K 3.4* 4.1 4.1  CL 103 111 114*  CO2 19 15* 17*  BUN 18 22 27*  CREATININE 2.52* 2.53* 2.34*  CALCIUM 9.4 8.0* 8.2*  GLUCOSE 112* 52* 79    CBG (last 3)   Recent Labs  09/20/14 0832 09/21/14 0749 09/21/14 0819  GLUCAP 123* 52* 68*    Scheduled Meds: . antiseptic oral rinse  7 mL Mouth Rinse BID  . budesonide  0.5 mg Nebulization BID  . heparin  5,000 Units Subcutaneous 3 times per day  . ketotifen  1 drop Both Eyes BID  . memantine  10 mg Oral BID  . montelukast  10 mg Oral QHS  . pantoprazole  80 mg Oral Daily  . piperacillin-tazobactam (ZOSYN)  IV  2.25 g Intravenous 3 times per day  . vancomycin  1,000  mg Intravenous Q48H    Continuous Infusions: . sodium chloride 50 mL/hr at 09/20/14 2110    Past Medical History  Diagnosis Date  . Asthma   . Hypertension   . Osteoporosis   . GERD (gastroesophageal reflux disease)   . Pulmonary embolism     hx of 4/09, sees Dr. Sherene SiresWert   . Leg edema   . DVT (deep venous thrombosis)     "?just one side"  . Pneumonia ?X1  . Asthmatic bronchitis   . Arthritis     "back mostly" (09/19/2014)  . Kidney stones   . Frequent UTI     "when she was younger" (09/19/2014)  . Dementia     "moderate to us; she talks to us in the family" (09/19/2014)    Past Surgical History  Procedure Laterality Date  . Cholecystectomy    . Lithotripsy    . Knee arthroscopy Right   . Hip fracture surgery Left   . Fracture surgery     . Carpal tunnel release Right   . Dilation and curettage of uterus      Marijean NiemannStephanie La, MS, RD, LDN Pager # [phone] After hours/ weekend pager # [phone]

## 2014-09-22 ENCOUNTER — Inpatient Hospital Stay (HOSPITAL_COMMUNITY): Payer: Medicare (Managed Care)

## 2014-09-22 LAB — URINE CULTURE: Colony Count: >=100000

## 2014-09-22 LAB — GLUCOSE, CAPILLARY: GLUCOSE-CAPILLARY: 92 mg/dL (ref 70–99)

## 2014-09-22 LAB — COMPREHENSIVE METABOLIC PANEL
ALBUMIN: 2.3 g/dL — AB (ref 3.5–5.2)
ALT: 38 U/L — ABNORMAL HIGH (ref 0–35)
AST: 45 U/L — AB (ref 0–37)
Alkaline Phosphatase: 274 U/L — ABNORMAL HIGH (ref 39–117)
Anion gap: 14 (ref 5–15)
BUN: 22 mg/dL (ref 6–23)
CO2: 20 mEq/L (ref 19–32)
Calcium: 8.6 mg/dL (ref 8.4–10.5)
Chloride: 115 mEq/L — ABNORMAL HIGH (ref 96–112)
Creatinine, Ser: 1.75 mg/dL — ABNORMAL HIGH (ref 0.50–1.10)
GFR calc Af Amer: 30 mL/min — ABNORMAL LOW (ref >90)
GFR calc non Af Amer: 26 mL/min — ABNORMAL LOW (ref >90)
Glucose, Bld: 90 mg/dL (ref 70–99)
Potassium: 3.8 mEq/L (ref 3.7–5.3)
Sodium: 149 mEq/L — ABNORMAL HIGH (ref 137–147)
TOTAL PROTEIN: 5.3 g/dL — AB (ref 6.0–8.3)
Total Bilirubin: 1.5 mg/dL — ABNORMAL HIGH (ref 0.3–1.2)

## 2014-09-22 LAB — CBC
HCT: 39.3 % (ref 36.0–46.0)
Hemoglobin: 13.1 g/dL (ref 12.0–15.0)
MCH: 28.7 pg (ref 26.0–34.0)
MCHC: 33.3 g/dL (ref 30.0–36.0)
MCV: 86.2 fL (ref 78.0–100.0)
PLATELETS: 77 10*3/uL — AB (ref 150–400)
RBC: 4.56 MIL/uL (ref 3.87–5.11)
RDW: 15 % (ref 11.5–15.5)
WBC: 27.1 10*3/uL — AB (ref 4.0–10.5)

## 2014-09-22 MED ORDER — CEFTRIAXONE SODIUM 1 G IJ SOLR
1.0000 g | INTRAMUSCULAR | Status: DC
  Administered 2014-09-22 – 2014-09-24 (×3): 1 g via INTRAVENOUS
  Filled 2014-09-22 (×3): qty 10

## 2014-09-22 MED ORDER — DEXTROSE 5 % IV SOLN
INTRAVENOUS | Status: DC
  Administered 2014-09-22 – 2014-09-23 (×2): via INTRAVENOUS

## 2014-09-22 NOTE — Progress Notes (Signed)
Pt checked on by two RT's to administer scheduled neb tx. Pt eating breakfast at that time. RT will check back to give tx.

## 2014-09-22 NOTE — ED Provider Notes (Signed)
    EKG Interpretation   Date/Time:  Tuesday September 19 2014 16:14:18 EDT Ventricular Rate:  97 PR Interval:  131 QRS Duration: 63 QT Interval:  436 QTC Calculation: 554 R Axis:   49 Text Interpretation:  Sinus rhythm Low voltage, precordial leads  Borderline T abnormalities, diffuse leads Prolonged QT interval No  significant change since last tracing Confirmed by Freida BusmanALLEN  MD, Adessa Primiano  ([id number]) on 09/19/2014 4:26:34 PM       Toy BakerAnthony T Breyona Swander, MD 09/22/14 1141

## 2014-09-22 NOTE — Progress Notes (Signed)
Pt seen and examined with Dr. Andrey CampanileWilson. Please refer to resident note for details.   Pt still somnolent but per daughter she woke up for her and was able to eat applesauce and ice cream this morning  Exam:  Gen: NAD, difficult to arouse  CV: RRR, normal heart sounds  Pulm: scattered exp wheeze +  Abd: soft, non tender, BS +  Ext: No pedal edema   Assessment and Plan:  80 y/o woman p/w AMS found to be in septic shock possibly secondary to aspiration PNA  Septic shock:  - Possibly secondary to UTI - Repeat CXR with no evidence of PNA  - Would d/c vanco and zosyn today. Start ceftriaxone - urine cx + for citrobacter. Blood cx negative till date  - Leukocytosis improving slowly. Will monitor  - Family does not wish to pursue aggressive measures and pt is DNR/DNI  - d/c NS and start D5W for now - Palliative care consult noted  AMS:  - likely secondary to septic shock  - CT head with no acute path  - Will monitor   AKI:  - Likely secondary to ATN secondary to hypotension  - Cr improving. Will monitor  Asthma:  - Pt with wheezing  - c/w nebs, pulmicort and singulair   Diastolic CHF:  - CXR with mild overload.  - Will consider lasix once off IVF

## 2014-09-22 NOTE — Progress Notes (Signed)
On-call provider notified of patient's elevated BP. No new orders given.

## 2014-09-22 NOTE — Progress Notes (Signed)
Subjective: Ms. Economos was seen and examined today.  Patient still not opening her eyes for me but daughter says she has been awake and was able to eat apple sauce and ice cream this morning.  Objective: Vital signs in last 24 hours: Filed Vitals:   09/21/14 1308 09/21/14 2143 09/21/14 2156 09/22/14 0521  BP: 137/53  169/65 138/60  Pulse: 64  53 52  Temp: 98.5 F (36.9 C)  99.6 F (37.6 C) 98.4 F (36.9 C)  TempSrc: Oral  Oral Oral  Resp: 18  18 20   Height:      Weight:      SpO2: 99% 98% 100% 100%   Weight change:   Intake/Output Summary (Last 24 hours) at 09/22/14 0729 Last data filed at 09/22/14 0522  Gross per 24 hour  Intake  482.5 ml  Output   1450 ml  Net -967.5 ml   Physical Exam General: sleeping comfortably in bed in NAD HEENT: [place]/AT, sclera anicteric, unable to assess oral pharynx CV: RRR, normal S1/S2, no m/g/r Pulm: unable to assess posterior breath sounds. Anteriorly no wheezes, rhonchi, or rales auscultated. Breathing comfortably Abd: soft, non-tender Ext: warm, trace edema Neuro: unresponsive, no facial asymmetry, no posturing  Lab Results: Basic Metabolic Panel:  Recent Labs Lab 09/21/14 1050 09/22/14 0445  NA 145 149*  K 4.1 3.8  CL 114* 115*  CO2 17* 20  GLUCOSE 79 90  BUN 27* 22  CREATININE 2.34* 1.75*  CALCIUM 8.2* 8.6   Liver Function Tests:  Recent Labs Lab 09/21/14 1050 09/22/14 0445  AST 60* 45*  ALT 42* 38*  ALKPHOS 120* 274*  BILITOT 1.2 1.5*  PROT 5.0* 5.3*  ALBUMIN 2.2* 2.3*    Recent Labs Lab 09/19/14 1720  AMMONIA 40   CBC:  Recent Labs Lab 09/19/14 1651  09/21/14 1050 09/22/14 0445  WBC 18.7*  < > 30.7* 27.1*  NEUTROABS 17.6*  --   --   --   HGB 14.4  < > 12.4 13.1  HCT 46.3*  < > 38.6 39.3  MCV 93.3  < > 90.2 86.2  PLT 92*  < > 82* PENDING  < > = values in this interval not displayed.  BNP:  Recent Labs Lab 09/19/14 1651  PROBNP 6958.0*   CBG:  Recent Labs Lab 09/19/14 1701  09/20/14 0754 09/20/14 0832 09/21/14 0749 09/21/14 0819  GLUCAP 110* 64* 123* 52* 68*   Urine Drug Screen: Drugs of Abuse     Component Value Date/Time   LABOPIA NONE DETECTED 09/19/2014 1705   COCAINSCRNUR NONE DETECTED 09/19/2014 1705   LABBENZ NONE DETECTED 09/19/2014 1705   AMPHETMU NONE DETECTED 09/19/2014 1705   THCU NONE DETECTED 09/19/2014 1705   LABBARB NONE DETECTED 09/19/2014 1705    Alcohol Level: No results found for this basename: ETH,  in the last 168 hours Urinalysis:  Recent Labs Lab 09/19/14 1705  COLORURINE AMBER*  LABSPEC 1.018  PHURINE 6.0  GLUCOSEU NEGATIVE  HGBUR LARGE*  BILIRUBINUR SMALL*  KETONESUR 15*  PROTEINUR 100*  UROBILINOGEN 1.0  NITRITE NEGATIVE  LEUKOCYTESUR LARGE*    Micro Results: Recent Results (from the past 240 hour(s))  CULTURE, BLOOD (ROUTINE X 2)     Status: None   Collection Time    09/19/14  4:51 PM      Result Value Ref Range Status   Specimen Description BLOOD RIGHT FOREARM   Final   Special Requests BOTTLES DRAWN AEROBIC AND ANAEROBIC 5CC   Final  Culture  Setup Time     Final   Value: 09/19/2014 22:20     Performed at [facility]   Culture     Final   Value:        BLOOD CULTURE RECEIVED NO GROWTH TO DATE CULTURE WILL BE HELD FOR 5 DAYS BEFORE ISSUING A FINAL NEGATIVE REPORT     Performed at [facility]   Report Status PENDING   Incomplete  URINE CULTURE     Status: None   Collection Time    09/19/14  5:05 PM      Result Value Ref Range Status   Specimen Description URINE, CLEAN CATCH   Final   Special Requests NONE   Final   Culture  Setup Time     Final   Value: 09/19/2014 22:28     Performed at [facility]   Colony Count     Final   Value: >=100,000 COLONIES/ML     Performed at [facility]   Culture     Final   Value: CITROBACTER KOSERI     Performed at [facility]   Report Status 09/22/2014 FINAL   Final   Organism ID, Bacteria CITROBACTER KOSERI    Final   Studies/Results: No results found. Medications: I have reviewed the patient's current medications. Scheduled Meds: . antiseptic oral rinse  7 mL Mouth Rinse BID  . budesonide  0.5 mg Nebulization BID  . heparin  5,000 Units Subcutaneous 3 times per day  . ketotifen  1 drop Both Eyes BID  . memantine  10 mg Oral BID  . montelukast  10 mg Oral QHS  . pantoprazole  80 mg Oral Daily  . piperacillin-tazobactam (ZOSYN)  IV  2.25 g Intravenous 3 times per day  . vancomycin  1,000 mg Intravenous Q48H   Continuous Infusions: . sodium chloride 50 mL/hr at 09/21/14 1944   PRN Meds:.acetaminophen, acetaminophen, albuterol, fluticasone, morphine injection Assessment/Plan: [name] is a 80yo woman w/ PMHx of dementia and asthma who presented to the ED with altered mental status and septic shock likely 2/2 UTI vs. Aspiration pneumonia.  1. Septic Shock 2/2 UTI vs. Aspiration Pneumonia: Unclear etiology.  Original concern for aspiration vs UTI.  Repeat CXR today w/o evidence of pneumonia.  Urine culture positive for citrobacter.  Blood cultures NGTD.  She has been afebrile and WBC down-trending. - narrow abx: d/c vanc and zosyn and START ceftriaxone. - monitor CBC - chaplain and palliative care consulted and recommendations/interventions appreciated; patient is DNR  2. AMS: Likely 2/2 septic shock. Daughter reports improvement today; she reportedly woke and was eating and able to speak. Her work of breathing appears less labored and she appears comfortable on exam.  - continue to monitor mental status - dysphagia 1 diet   3. AKI: Cr 2.53 on admission, now improving to 1.75. Baseline 1.2. AKI most likely due to sepsis.  - STOP NS because Na is high, will START D5 water a 50cc/hr.  4. Diastolic CHF: Patient has trace edema on exam but does not appear volume overloaded. She is on NS @ 50 ml/hr to maintain her BP.  - Continue low fluids - Continue oxygen therapy  5. Asthma: Continues to  have no wheezing on physical exam. She is on Albuterol nebs Q6H PRN wheezing and Pulmicort nebs BID. She is breathing comfortably on 3 L oxygen via [place] and saturating at 99-100%. - Continue Albuterol and Pulmicort nebs - Oxygen therapy as  needed  Diet: DYS 1 DVT/PE PPx: Heparin SQ Dispo: Disposition is deferred at this time, awaiting improvement of current medical problems.    The patient does have a current PCP Jethro Bastos(Robert N Koehler, MD) and [facility] after discharge.  The patient does not have transportation limitations that hinder transportation to clinic appointments.  .Services Needed at time of discharge: Y = Yes, Blank = No PT:   OT:   RN:   Equipment:   Other:     LOS: 3 days   Yolanda MangesAlex M Wilson, DO 09/22/2014, 7:29 AM

## 2014-09-22 NOTE — Care Management Note (Addendum)
    Page 1 of 2   09/29/2014     2:46:30 PM CARE MANAGEMENT NOTE 09/29/2014  Patient:  [name],[name]   Account Number:  [id number]  Date Initiated:  09/20/2014  Documentation initiated by:  Letha CapeAYLOR,Taleigha Pinson  Subjective/Objective Assessment:   dx ams  admit- lives with daughter.  Patient is active with [facility].     Action/Plan:   Anticipated DC Date:  09/29/2014   Anticipated DC Plan:  HOME W HOME HEALTH SERVICES      DC Planning Services  CM consult      [facility]  Resumption Of Svcs/PTA Provider   Choice offered to / List presented to:  C-4 Adult Children        HH arranged  HH-5 SPEECH THERAPY  HH-2 PT      HH agency  OTHER - SEE NOTE   Status of service:  Completed, signed off Medicare Important Message given?  YES (If response is "NO", the following Medicare IM given date fields will be blank) Date Medicare IM given:  09/22/2014 Medicare IM given by:  Letha CapeAYLOR,Zayn Selley Date Additional Medicare IM given:  09/28/2014 Additional Medicare IM given by:  Letha CapeEBORAH Destanie Tibbetts  Discharge Disposition:  HOME [facility]  Per UR Regulation:  Reviewed for med. necessity/level of care/duration of stay  If discussed at Long Length of Stay Meetings, dates discussed:    Comments:  09/29/14 1444 Letha Capeeborah Lenus Trauger RN BSN [phone] patient is for dc today , [facility] notified.  CSW facilitating transport.  Daughter Clydie BraunKaren is aware.  NCM spoke with [facility] to notify her of hh pt/st order, she states they will follow up with this.  09/28/14 1112 Letha Capeeborah Zachary Lovins RN ,BSN [phone] patient with fever today, plan for dc tomorrow per MD. NCM left Social Worker a message at [facility].  09/27/14 1415 Letha Capeeborah Tevan Marian RN BSN [phone] [facility], NCM spoke with Irving BurtonEmily the Child psychotherap[facility]ocial Worker [facility], she states [facility] will [facility], [facility] the pt at [facility] and Irving Burtonmily is the Child psychotherapistocial Worker and they have a Charity fundraiserN that follows the patient.  Pateint will need  ambulance transport home at dc, NCM notified CSW who will set up transport .  NCM spoke with MD and the plan is for tomorrow, they need to transition abx to po meds.  NCM left message [place] [facility]mily [facility] to plan for tomorow,and also NCM informed Clydie BraunKaren the daughter.  09/25/14 1754 Letha Capeeborah Toshiba Null RN , BSN [phone] conts with fever, on abx, cx's pending.  NCM will continue to follow for dc needs.  09/20/14 2036 Letha Capeeborah Susy Placzek RN, BSN [phone] patient lives with daughter, NCM will continue to follow for dc needs.

## 2014-09-23 LAB — CBC
HEMATOCRIT: 42.5 % (ref 36.0–46.0)
HEMOGLOBIN: 14.2 g/dL (ref 12.0–15.0)
MCH: 28.3 pg (ref 26.0–34.0)
MCHC: 33.4 g/dL (ref 30.0–36.0)
MCV: 84.7 fL (ref 78.0–100.0)
Platelets: 57 10*3/uL — ABNORMAL LOW (ref 150–400)
RBC: 5.02 MIL/uL (ref 3.87–5.11)
RDW: 14.8 % (ref 11.5–15.5)
WBC: 13.3 10*3/uL — AB (ref 4.0–10.5)

## 2014-09-23 LAB — BASIC METABOLIC PANEL
ANION GAP: 12 (ref 5–15)
BUN: 13 mg/dL (ref 6–23)
CO2: 22 mEq/L (ref 19–32)
Calcium: 9.1 mg/dL (ref 8.4–10.5)
Chloride: 110 mEq/L (ref 96–112)
Creatinine, Ser: 1.32 mg/dL — ABNORMAL HIGH (ref 0.50–1.10)
GFR calc Af Amer: 42 mL/min — ABNORMAL LOW (ref >90)
GFR calc non Af Amer: 36 mL/min — ABNORMAL LOW (ref >90)
GLUCOSE: 158 mg/dL — AB (ref 70–99)
POTASSIUM: 3 meq/L — AB (ref 3.7–5.3)
SODIUM: 144 meq/L (ref 137–147)

## 2014-09-23 LAB — GLUCOSE, CAPILLARY: Glucose-Capillary: 133 mg/dL — ABNORMAL HIGH (ref 70–99)

## 2014-09-23 MED ORDER — POTASSIUM CHLORIDE 20 MEQ/15ML (10%) PO LIQD
40.0000 meq | Freq: Two times a day (BID) | ORAL | Status: AC
  Administered 2014-09-23 (×2): 40 meq via ORAL
  Filled 2014-09-23 (×2): qty 30

## 2014-09-23 MED ORDER — GUAIFENESIN-DM 100-10 MG/5ML PO SYRP
10.0000 mL | ORAL_SOLUTION | Freq: Four times a day (QID) | ORAL | Status: DC | PRN
  Administered 2014-09-23: 10 mL via ORAL
  Filled 2014-09-23 (×2): qty 10

## 2014-09-23 NOTE — Progress Notes (Signed)
09/23/14 call placed to Int Med to inform of daughter want her mother to be seen by [facility] when able.

## 2014-09-23 NOTE — Progress Notes (Signed)
Notified Dr. Senaida Oresichardson that pt's temp is 101.5, giving tynenol as ordered now. MD put in order for BCx2. Will continue to monitor pt. Nelda MarseilleJenny Thacker, RN

## 2014-09-23 NOTE — Progress Notes (Signed)
Subjective: [name] was seen and examined today.  She opened her eyes and was tracking.  She did not respond verbally.    Objective: Vital signs in last 24 hours: Filed Vitals:   09/22/14 2209 09/23/14 0630 09/23/14 0735 09/23/14 0925  BP: 160/80 173/78 162/79   Pulse: 74 73    Temp: 99.6 F (37.6 C) 99.6 F (37.6 C)    TempSrc: Oral Oral    Resp: 24 22    Height:      Weight:      SpO2: 97% 100%  98%   Weight change:   Intake/Output Summary (Last 24 hours) at 09/23/14 1116 Last data filed at 09/23/14 0904  Gross per 24 hour  Intake 1203.33 ml  Output   3250 ml  Net -2046.67 ml   Physical Exam General: awake and comfortable, in bed in NAD HEENT: /AT CV: RRR, normal S1/S2, no m/g/r Pulm: mild wheezing RML, otherwise clear, no crackles.  Breathing comfortably on 2L. Abd: soft, non-tender, ND Ext: warm, trace edema B/L Neuro: she is alert today, eyes open and tracking voices, no facial asymmetry, no posturing  Lab Results: Basic Metabolic Panel:  Recent Labs Lab 09/22/14 0445 09/23/14 0448  NA 149* 144  K 3.8 3.0*  CL 115* 110  CO2 20 22  GLUCOSE 90 158*  BUN 22 13  CREATININE 1.75* 1.32*  CALCIUM 8.6 9.1   Liver Function Tests:  Recent Labs Lab 09/21/14 1050 09/22/14 0445  AST 60* 45*  ALT 42* 38*  ALKPHOS 120* 274*  BILITOT 1.2 1.5*  PROT 5.0* 5.3*  ALBUMIN 2.2* 2.3*    Recent Labs Lab 09/19/14 1720  AMMONIA 40   CBC:  Recent Labs Lab 09/19/14 1651  09/22/14 0445 09/23/14 0448  WBC 18.7*  < > 27.1* 13.3*  NEUTROABS 17.6*  --   --   --   HGB 14.4  < > 13.1 14.2  HCT 46.3*  < > 39.3 42.5  MCV 93.3  < > 86.2 84.7  PLT 92*  < > 77* 57*  < > = values in this interval not displayed.  BNP:  Recent Labs Lab 09/19/14 1651  PROBNP 6958.0*   CBG:  Recent Labs Lab 09/20/14 0754 09/20/14 0832 09/21/14 0749 09/21/14 0819 09/22/14 0739 09/23/14 0754  GLUCAP 64* 123* 52* 68* 92 133*   Studies/Results: Dg Chest 2  View  09/22/2014   CLINICAL DATA:  Shortness of breath, very weak, personal history of asthma, hypertension, pulmonary embolism, asthmatic bronchitis  EXAM: CHEST  2 VIEW  COMPARISON:  09/19/2014  FINDINGS: Enlargement of cardiac silhouette.  Pulmonary vascular congestion.  Perihilar infiltrates favor mild pulmonary edema.  Small RIGHT pleural effusion minimal basilar atelectasis.  Slight rotation to the RIGHT.  No pneumothorax.  Healing versus old distal RIGHT clavicular fracture.  IMPRESSION: Probable mild CHF, little changed.   Electronically Signed   By: Ulyses SouthwardMark  Boles M.D.   On: 09/22/2014 07:58   Medications: I have reviewed the patient's current medications. Scheduled Meds: . budesonide  0.5 mg Nebulization BID  . cefTRIAXone (ROCEPHIN)  IV  1 g Intravenous Q24H  . heparin  5,000 Units Subcutaneous 3 times per day  . ketotifen  1 drop Both Eyes BID  . memantine  10 mg Oral BID  . montelukast  10 mg Oral QHS  . pantoprazole  80 mg Oral Daily   Continuous Infusions: . dextrose 50 mL/hr at 09/22/14 1320   PRN Meds:.acetaminophen, acetaminophen, albuterol, fluticasone, guaiFENesin-dextromethorphan,  morphine injection  Assessment/Plan: [name] is a 80yo woman w/ PMHx of dementia and asthma who presented to the ED with altered mental status and septic shock likely 2/2 UTI vs. aspiration pneumonia.  1. Septic Shock 2/2 UTI:  Unclear etiology.  Original concern for aspiration vs UTI.  Repeat CXR w/o evidence of pneumonia.  Urine culture positive for citrobacter.  Blood cultures NGTD.  She continues to improve.  She has been afebrile and WBC down-trending. - continue ceftriaxone - d/c fluids since she has improved, also she has some LE edema and BPs are now elevated - monitor CBC - chaplain and palliative care consulted and recommendations/interventions appreciated; patient is DNR  2. AMS: Likely 2/2 septic shock. The patient continues to improve; she awoke this morning and was tracking.   She swallowed her pills with apple sauce and sipped some juice.  - continue to monitor mental status - dysphagia 1 diet   3. AKI: Cr 2.53 on admission, now improving to 1.32. Baseline 1.2. AKI most likely due to sepsis.  - d/c fluids   4. Diastolic CHF: Patient has trace edema on exam but does not appear volume overloaded. - Continue oxygen therapy - d/c fluids  5. Asthma: Continues to have no wheezing on physical exam. She is on Albuterol nebs Q6H PRN wheezing and Pulmicort nebs BID. She is breathing comfortably on 2 L oxygen via [place] and saturating at 99-100%.  Good air movement on exam but her daughter reports she sounded congested and normally uses Robitussin at home.  - continue Albuterol and Pulmicort nebs - oxygen therapy as needed - resume home robitussin for cough/congestion  6. Hypokalemia:  K 3.0 today.  - supplement with liquid Kdur 40mEq x 2   7. Thrombocytopenia:  plt 92 at admission five days ago now down to 57.   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 - d/c [place] heparin and place SCDs - send HIT panel - monitor plt count  Diet: DYS 1 DVT/PE PPx: SCDs; stop [place] heparin due to low plt count. Dispo: Disposition is deferred at this time, awaiting improvement of current medical problems.    The patient does have a current PCP Jethro Bastos(Robert N Koehler, MD) and [facility] after discharge.  The patient does not have transportation limitations that hinder transportation to clinic appointments.  .Services Needed at time of discharge: Y = Yes, Blank = No PT:   OT:   RN:   Equipment:   Other:     LOS: 4 days   Yolanda MangesAlex M Faithanne Verret, DO 09/23/2014, 11:16 AM

## 2014-09-24 DIAGNOSIS — D696 Thrombocytopenia, unspecified: Secondary | ICD-10-CM

## 2014-09-24 LAB — COMPREHENSIVE METABOLIC PANEL
ALBUMIN: 2.3 g/dL — AB (ref 3.5–5.2)
ALK PHOS: 102 U/L (ref 39–117)
ALT: 29 U/L (ref 0–35)
AST: 27 U/L (ref 0–37)
Anion gap: 15 (ref 5–15)
BILIRUBIN TOTAL: 1.6 mg/dL — AB (ref 0.3–1.2)
BUN: 16 mg/dL (ref 6–23)
CHLORIDE: 109 meq/L (ref 96–112)
CO2: 21 mEq/L (ref 19–32)
Calcium: 9 mg/dL (ref 8.4–10.5)
Creatinine, Ser: 1.59 mg/dL — ABNORMAL HIGH (ref 0.50–1.10)
GFR calc Af Amer: 34 mL/min — ABNORMAL LOW (ref >90)
GFR calc non Af Amer: 29 mL/min — ABNORMAL LOW (ref >90)
Glucose, Bld: 111 mg/dL — ABNORMAL HIGH (ref 70–99)
POTASSIUM: 4 meq/L (ref 3.7–5.3)
Sodium: 145 mEq/L (ref 137–147)
Total Protein: 5.9 g/dL — ABNORMAL LOW (ref 6.0–8.3)

## 2014-09-24 LAB — CBC
HEMATOCRIT: 40.9 % (ref 36.0–46.0)
Hemoglobin: 13.5 g/dL (ref 12.0–15.0)
MCH: 28.7 pg (ref 26.0–34.0)
MCHC: 33 g/dL (ref 30.0–36.0)
MCV: 86.8 fL (ref 78.0–100.0)
PLATELETS: 40 10*3/uL — AB (ref 150–400)
RBC: 4.71 MIL/uL (ref 3.87–5.11)
RDW: 15 % (ref 11.5–15.5)
WBC: 9.5 10*3/uL (ref 4.0–10.5)

## 2014-09-24 LAB — GLUCOSE, CAPILLARY: Glucose-Capillary: 111 mg/dL — ABNORMAL HIGH (ref 70–99)

## 2014-09-24 NOTE — Progress Notes (Signed)
Subjective: Overnight, patient spiked a 101.5 fever. She was given Tylenol and is now afebrile at 98.8. Repeat blood cultures taken.  Today, patient opened her eyes and was tracking. She did not respond verbally and did not follow commands. Daughter notes she has been eating well.  Objective: Vital signs in last 24 hours: Filed Vitals:   09/23/14 2106 09/23/14 2220 09/23/14 2233 09/24/14 0615  BP: 145/69   137/73  Pulse: 103   95  Temp: 101.5 F (38.6 C) 100.5 F (38.1 C)  98.8 F (37.1 C)  TempSrc:  Oral  Oral  Resp: 16   16  Height:      Weight:      SpO2: 99%  99% 98%   Weight change:   Intake/Output Summary (Last 24 hours) at 09/24/14 0733 Last data filed at 09/23/14 2001  Gross per 24 hour  Intake 245.83 ml  Output    650 ml  Net -404.17 ml   Physical Exam General: awake, comfortable, sitting up in bed HEENT: [place]/AT CV: RRR, normal S1/S2, no m/g/r Pulm: CTA bilaterally, breaths non-labored on 2L oxygen via [place] Abd: BS+, soft, non-tender Ext: warm, trace edema, moves all Neuro: alert, opened her eyes and was tracking voices, no facial asymmetry  Lab Results: Basic Metabolic Panel:  Recent Labs Lab 09/23/14 0448 09/24/14 0550  NA 144 145  K 3.0* 4.0  CL 110 109  CO2 22 21  GLUCOSE 158* 111*  BUN 13 16  CREATININE 1.32* 1.59*  CALCIUM 9.1 9.0   Liver Function Tests:  Recent Labs Lab 09/22/14 0445 09/24/14 0550  AST 45* 27  ALT 38* 29  ALKPHOS 274* 102  BILITOT 1.5* 1.6*  PROT 5.3* 5.9*  ALBUMIN 2.3* 2.3*   No results found for this basename: LIPASE, AMYLASE,  in the last 168 hours  Recent Labs Lab 09/19/14 1720  AMMONIA 40   CBC:  Recent Labs Lab 09/19/14 1651  09/23/14 0448 09/24/14 0550  WBC 18.7*  < > 13.3* 9.5  NEUTROABS 17.6*  --   --   --   HGB 14.4  < > 14.2 13.5  HCT 46.3*  < > 42.5 40.9  MCV 93.3  < > 84.7 86.8  PLT 92*  < > 57* PENDING  < > = values in this interval not displayed. Cardiac Enzymes: No results found  for this basename: CKTOTAL, CKMB, CKMBINDEX, TROPONINI,  in the last 168 hours BNP:  Recent Labs Lab 09/19/14 1651  PROBNP 6958.0*   D-Dimer: No results found for this basename: DDIMER,  in the last 168 hours CBG:  Recent Labs Lab 09/20/14 0754 09/20/14 0832 09/21/14 0749 09/21/14 0819 09/22/14 0739 09/23/14 0754  GLUCAP 64* 123* 52* 68* 92 133*   Hemoglobin A1C: No results found for this basename: HGBA1C,  in the last 168 hours Fasting Lipid Panel: No results found for this basename: CHOL, HDL, LDLCALC, TRIG, CHOLHDL, LDLDIRECT,  in the last 168 hours Thyroid Function Tests: No results found for this basename: TSH, T4TOTAL, FREET4, T3FREE, THYROIDAB,  in the last 168 hours Coagulation: No results found for this basename: LABPROT, INR,  in the last 168 hours Anemia Panel: No results found for this basename: VITAMINB12, FOLATE, FERRITIN, TIBC, IRON, RETICCTPCT,  in the last 168 hours Urine Drug Screen: Drugs of Abuse     Component Value Date/Time   LABOPIA NONE DETECTED 09/19/2014 1705   COCAINSCRNUR NONE DETECTED 09/19/2014 1705   LABBENZ NONE DETECTED 09/19/2014 1705   AMPHETMU  NONE DETECTED 09/19/2014 1705   THCU NONE DETECTED 09/19/2014 1705   LABBARB NONE DETECTED 09/19/2014 1705    Alcohol Level: No results found for this basename: ETH,  in the last 168 hours Urinalysis:  Recent Labs Lab 09/19/14 1705  COLORURINE AMBER*  LABSPEC 1.018  PHURINE 6.0  GLUCOSEU NEGATIVE  HGBUR LARGE*  BILIRUBINUR SMALL*  KETONESUR 15*  PROTEINUR 100*  UROBILINOGEN 1.0  NITRITE NEGATIVE  LEUKOCYTESUR LARGE*     Micro Results: Recent Results (from the past 240 hour(s))  CULTURE, BLOOD (ROUTINE X 2)     Status: None   Collection Time    09/19/14  4:51 PM      Result Value Ref Range Status   Specimen Description BLOOD RIGHT FOREARM   Final   Special Requests BOTTLES DRAWN AEROBIC AND ANAEROBIC 5CC   Final   Culture  Setup Time     Final   Value: 09/19/2014 22:20       Performed at [facility]   Culture     Final   Value:        BLOOD CULTURE RECEIVED NO GROWTH TO DATE CULTURE WILL BE HELD FOR 5 DAYS BEFORE ISSUING A FINAL NEGATIVE REPORT     Performed at [facility]   Report Status PENDING   Incomplete  URINE CULTURE     Status: None   Collection Time    09/19/14  5:05 PM      Result Value Ref Range Status   Specimen Description URINE, CLEAN CATCH   Final   Special Requests NONE   Final   Culture  Setup Time     Final   Value: 09/19/2014 22:28     Performed at [facility] Count     Final   Value: >=100,000 COLONIES/ML     Performed at [facility]   Culture     Final   Value: CITROBACTER KOSERI     Performed at [facility]   Report Status 09/22/2014 FINAL   Final   Organism ID, Bacteria CITROBACTER KOSERI   Final   Studies/Results: No results found. Medications: I have reviewed the patient's current medications. Scheduled Meds: . budesonide  0.5 mg Nebulization BID  . cefTRIAXone (ROCEPHIN)  IV  1 g Intravenous Q24H  . ketotifen  1 drop Both Eyes BID  . memantine  10 mg Oral BID  . montelukast  10 mg Oral QHS  . pantoprazole  80 mg Oral Daily   Continuous Infusions:  PRN Meds:.acetaminophen, acetaminophen, albuterol, fluticasone, guaiFENesin-dextromethorphan, morphine injection Assessment/Plan: Ms. Buhl is a 80yo woman w/ PMHx of dementia and asthma who presented to the ED with altered mental status and septic shock likely 2/2 UTI.   1. Septic Shock 2/2 UTI: Urine culture positive for citrobacter. Blood cultures NGTD. Patient spiked a 101.5 fever overnight which improved with Tylenol. Repeat blood cultures obtained. She continues to improve. WBC down-trending, was 9.5 today.  - Continue ceftriaxone  - Continue monitoring CBC  - Chaplain and palliative care consulted and recommendations/interventions appreciated; patient is DNR   2. AMS: Likely 2/2 septic shock. The patient  continues to improve; she was awake this morning and was tracking. She is eating well per family and nurse.  - Continue to monitor mental status  - Dysphagia 1 diet   3. AKI: Cr 2.53 on admission, now 1.59 up from 1.32 yesterday. Baseline 1.2. AKI most likely due to sepsis.  - Continue to  monitor  4. Diastolic CHF: Patient has trace edema on exam but does not appear volume overloaded.  - Continue oxygen therapy   5. Asthma: Continues to have no wheezing on physical exam. She is on Albuterol nebs Q6H PRN wheezing and Pulmicort nebs BID. She is breathing comfortably on 2 L oxygen via [place] and saturating at 99-100%. Good air movement on exam. - Continue Albuterol and Pulmicort nebs  - Oxygen therapy as needed  - Continue home robitussin for cough/congestion   6. Hypokalemia- Resolved: K 4.0 today.   7. Thrombocytopenia: Platelets 92 at admission five days ago now down to 40. 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 Heparin d/c'd on 10/24.  - Continue SCDs - HIT panel pending  - Continue to monitor plt count   Diet: DYS 1  DVT/PE PPx: SCDs; stopped [place] heparin due to low plt count.  Dispo: Disposition is deferred at this time, awaiting improvement of current medical problems.   The patient does have a current PCP Jethro Bastos(Robert N Koehler, MD) and [facility] after discharge.  The patient does not have transportation limitations that hinder transportation to clinic appointments.  .Services Needed at time of discharge: Y = Yes, Blank = No PT:   OT:   RN:   Equipment:   Other:     LOS: 5 days   Rich Numberarly Kinzee Happel, MD 09/24/2014, 7:33 AM

## 2014-09-25 ENCOUNTER — Inpatient Hospital Stay (HOSPITAL_COMMUNITY): Payer: Medicare (Managed Care)

## 2014-09-25 DIAGNOSIS — D696 Thrombocytopenia, unspecified: Secondary | ICD-10-CM

## 2014-09-25 DIAGNOSIS — E876 Hypokalemia: Secondary | ICD-10-CM

## 2014-09-25 LAB — BASIC METABOLIC PANEL
Anion gap: 13 (ref 5–15)
BUN: 19 mg/dL (ref 6–23)
CHLORIDE: 112 meq/L (ref 96–112)
CO2: 24 mEq/L (ref 19–32)
CREATININE: 1.69 mg/dL — AB (ref 0.50–1.10)
Calcium: 9.3 mg/dL (ref 8.4–10.5)
GFR, EST AFRICAN AMERICAN: 31 mL/min — AB (ref >90)
GFR, EST NON AFRICAN AMERICAN: 27 mL/min — AB (ref >90)
Glucose, Bld: 125 mg/dL — ABNORMAL HIGH (ref 70–99)
Potassium: 3.3 mEq/L — ABNORMAL LOW (ref 3.7–5.3)
Sodium: 149 mEq/L — ABNORMAL HIGH (ref 137–147)

## 2014-09-25 LAB — CULTURE, BLOOD (ROUTINE X 2): Culture: NO GROWTH

## 2014-09-25 LAB — GLUCOSE, CAPILLARY: Glucose-Capillary: 112 mg/dL — ABNORMAL HIGH (ref 70–99)

## 2014-09-25 LAB — CBC
HEMATOCRIT: 35.9 % — AB (ref 36.0–46.0)
Hemoglobin: 11.9 g/dL — ABNORMAL LOW (ref 12.0–15.0)
MCH: 28.6 pg (ref 26.0–34.0)
MCHC: 33.1 g/dL (ref 30.0–36.0)
MCV: 86.3 fL (ref 78.0–100.0)
Platelets: 57 10*3/uL — ABNORMAL LOW (ref 150–400)
RBC: 4.16 MIL/uL (ref 3.87–5.11)
RDW: 15 % (ref 11.5–15.5)
WBC: 10.9 10*3/uL — ABNORMAL HIGH (ref 4.0–10.5)

## 2014-09-25 MED ORDER — PANTOPRAZOLE SODIUM 40 MG IV SOLR
40.0000 mg | INTRAVENOUS | Status: DC
  Administered 2014-09-25 – 2014-09-29 (×5): 40 mg via INTRAVENOUS
  Filled 2014-09-25 (×6): qty 40

## 2014-09-25 MED ORDER — PIPERACILLIN-TAZOBACTAM 3.375 G IVPB
3.3750 g | Freq: Three times a day (TID) | INTRAVENOUS | Status: DC
  Administered 2014-09-25 – 2014-09-26 (×4): 3.375 g via INTRAVENOUS
  Filled 2014-09-25 (×6): qty 50

## 2014-09-25 MED ORDER — SODIUM CHLORIDE 0.9 % IV SOLN
Freq: Once | INTRAVENOUS | Status: AC
  Administered 2014-09-26: via INTRAVENOUS

## 2014-09-25 MED ORDER — ENSURE COMPLETE PO LIQD
237.0000 mL | Freq: Two times a day (BID) | ORAL | Status: DC
  Administered 2014-09-25 – 2014-09-29 (×6): 237 mL via ORAL

## 2014-09-25 MED ORDER — POTASSIUM CHLORIDE 20 MEQ/15ML (10%) PO LIQD
40.0000 meq | Freq: Once | ORAL | Status: AC
  Administered 2014-09-25: 40 meq via ORAL
  Filled 2014-09-25: qty 30

## 2014-09-25 MED ORDER — SODIUM CHLORIDE 0.9 % IV SOLN
INTRAVENOUS | Status: AC
  Administered 2014-09-25 – 2014-09-26 (×2): via INTRAVENOUS

## 2014-09-25 MED ORDER — PIPERACILLIN-TAZOBACTAM 3.375 G IVPB 30 MIN
3.3750 g | Freq: Once | INTRAVENOUS | Status: AC
  Administered 2014-09-25: 3.375 g via INTRAVENOUS
  Filled 2014-09-25: qty 50

## 2014-09-25 NOTE — Progress Notes (Signed)
Pt seen and examined with Dr. Beckie Saltsivet. Please refer to resident note for details.  Pt more alert today. Pt with episodes of fever up to 102.4. Last temp was 100.4. Pt also with cough per daughter at bedside and audible wheezing  Exam:  Gen: NAD, awake, alert today but does not respond to questions  CV: RRR, normal heart sounds  Pulm: b/l wheeze R>L and decreased breath sounds R base Abd: soft, non tender, BS +  Ext: No pedal edema   Assessment and Plan:  80 y/o woman p/w AMS found to be in septic shock possibly secondary to aspiration PNA/UTI  Septic shock:  - Recurrent fevers likely secondary to aspiration PNA - Will get speech pathology eval - Repeat CXR with possible RLL PNA. F/u repeat blood cx - NGTD - ceftriaxone dc'd and zosyn resumed given possible aspiration PNA  - Leukocytosis improving. Will monitor   AMS:  - likely secondary to septic shock  - CT head with no acute path  - Will monitor   AKI:  - Cr improved since admission butstill elevated at 1.69 today. Will monitor  Asthma:  - c/w nebs, pulmicort and singulair  Diastolic CHF:  - Stable for now. Will monitor  Case d/w daughter at bedside. Will get PT eval

## 2014-09-25 NOTE — Progress Notes (Signed)
Notified dr about temp of 102.4. New orders received. Madelin RearLonnie Jovonda Selner, MSN, RN, [employer] EnergyCMSRN

## 2014-09-25 NOTE — Progress Notes (Signed)
NUTRITION FOLLOW UP  Intervention:   Ensure Complete po BID, each supplement provides 350 kcal and 13 grams of protein  Nutrition Dx:   Inadequate oral intake related to difficulty eating as evidenced by meal completion 20%; ongoing  Goal:   Pt to meet >/= 90% of their estimated nutrition needs   Monitor:   PO intake, weight trends, labs, I/O's  Assessment:   Pt with PMH of dementia, HTN, hx of DVT of PE, hx of subarachnoid hemorrhage, asthma, who presents with altered mental status. At the ED she found to be in septic shock with aspiration pneumonia as the likely source of sepsis. Hx obtained from pt daughter at bedside. She reports that pt has has good tolerance of pureed diet, however, intake has decreased the past few days due to congestion. Intake remains poor; PO: 0-25%. Daughter is concerned about poor intake and is asking about ways to maximize intake. She is agreeable to supplements and reports she drinks Ensure at home. Reinforced importance of good PO intake and supplement to support healing process.  Per MD notes, there is concern of possible aspiration pneumonia. Daughter reports that speech therapist is supposed to come by to evaluate.  Labs reviewed. Na: 149, K: 3.3, Creat: 1.69, Glucose: 125.   Height: Ht Readings from Last 1 Encounters:  09/19/14 4\' 11"  (1.499 m)    Weight Status:   Wt Readings from Last 1 Encounters:  09/19/14 145 lb 9.6 oz (66.044 kg)    Re-estimated needs:  Kcal: 1650-1850  Protein: 75-85 grams  Fluid: 1.65- 1.85 L/day  Skin: Intact  Diet Order: Dysphagia 1   Intake/Output Summary (Last 24 hours) at 09/25/14 0953 Last data filed at 09/25/14 0700  Gross per 24 hour  Intake      0 ml  Output    725 ml  Net   -725 ml    Last BM: 09/23/14   Labs:   Recent Labs Lab 09/23/14 0448 09/24/14 0550 09/25/14 0428  NA 144 145 149*  K 3.0* 4.0 3.3*  CL 110 109 112  CO2 22 21 24   BUN 13 16 19   CREATININE 1.32* 1.59* 1.69*  CALCIUM  9.1 9.0 9.3  GLUCOSE 158* 111* 125*    CBG (last 3)   Recent Labs  09/23/14 0754 09/24/14 0756 09/25/14 0802  GLUCAP 133* 111* 112*    Scheduled Meds: . budesonide  0.5 mg Nebulization BID  . ketotifen  1 drop Both Eyes BID  . memantine  10 mg Oral BID  . montelukast  10 mg Oral QHS  . pantoprazole (PROTONIX) IV  40 mg Intravenous Q24H  . piperacillin-tazobactam (ZOSYN)  IV  3.375 g Intravenous Q8H    Continuous Infusions:   Joella Saefong A. Mayford KnifeWilliams, RD, LDN Pager: [phone] After hours Pager: [phone]

## 2014-09-25 NOTE — Progress Notes (Addendum)
MD Rivet notified of patients HR of 125. Orders recieved

## 2014-09-25 NOTE — Progress Notes (Signed)
Reported pt's temp of of 100.4 to MD Rivet. No new orders at this time

## 2014-09-25 NOTE — Progress Notes (Signed)
Subjective: Patient alert and awake this morning. She is tracking voices and movements. Still not verbalizing. Concern that she is developing aspiration pneumonia given she spiked a fever of 102 overnight. CXR showed right pleural effusion and airspace consolidation concerning for pneumonia. Repeat blood cultures drawn. She was switched to Zosyn.   Objective: Vital signs in last 24 hours: Filed Vitals:   09/25/14 0030 09/25/14 0523 09/25/14 0906 09/25/14 1006  BP:  145/71    Pulse:  101    Temp: 99 F (37.2 C) 99.8 F (37.7 C) 100.4 F (38 C)   TempSrc: Oral Oral Oral   Resp:  16    Height:      Weight:      SpO2:  98%  96%   Weight change:   Intake/Output Summary (Last 24 hours) at 09/25/14 1248 Last data filed at 09/25/14 0700  Gross per 24 hour  Intake      0 ml  Output    725 ml  Net   -725 ml   Physical Exam General: awake, sitting up in bed, tracking movements and voices HEENT: [place]/AT, EOMI, mucus membranes moist CV: RRR, normal S1/S2, no m/g/r Pulm: wheezing bilaterally, crackles at right lung base Abd: BS+, soft, non-tender Ext: warm, trace edema, moves all Neuro: alert, awake, nonverbal, no facial asymmetry  Lab Results: Basic Metabolic Panel:  Recent Labs Lab 09/24/14 0550 09/25/14 0428  NA 145 149*  K 4.0 3.3*  CL 109 112  CO2 21 24  GLUCOSE 111* 125*  BUN 16 19  CREATININE 1.59* 1.69*  CALCIUM 9.0 9.3   Liver Function Tests:  Recent Labs Lab 09/22/14 0445 09/24/14 0550  AST 45* 27  ALT 38* 29  ALKPHOS 274* 102  BILITOT 1.5* 1.6*  PROT 5.3* 5.9*  ALBUMIN 2.3* 2.3*   No results found for this basename: LIPASE, AMYLASE,  in the last 168 hours  Recent Labs Lab 09/19/14 1720  AMMONIA 40   CBC:  Recent Labs Lab 09/19/14 1651  09/24/14 0550 09/25/14 0428  WBC 18.7*  < > 9.5 10.9*  NEUTROABS 17.6*  --   --   --   HGB 14.4  < > 13.5 11.9*  HCT 46.3*  < > 40.9 35.9*  MCV 93.3  < > 86.8 86.3  PLT 92*  < > 40* 57*  < > = values  in this interval not displayed. Cardiac Enzymes: No results found for this basename: CKTOTAL, CKMB, CKMBINDEX, TROPONINI,  in the last 168 hours BNP:  Recent Labs Lab 09/19/14 1651  PROBNP 6958.0*   D-Dimer: No results found for this basename: DDIMER,  in the last 168 hours CBG:  Recent Labs Lab 09/21/14 0749 09/21/14 0819 09/22/14 0739 09/23/14 0754 09/24/14 0756 09/25/14 0802  GLUCAP 52* 68* 92 133* 111* 112*   Hemoglobin A1C: No results found for this basename: HGBA1C,  in the last 168 hours Fasting Lipid Panel: No results found for this basename: CHOL, HDL, LDLCALC, TRIG, CHOLHDL, LDLDIRECT,  in the last 168 hours Thyroid Function Tests: No results found for this basename: TSH, T4TOTAL, FREET4, T3FREE, THYROIDAB,  in the last 168 hours Coagulation: No results found for this basename: LABPROT, INR,  in the last 168 hours Anemia Panel: No results found for this basename: VITAMINB12, FOLATE, FERRITIN, TIBC, IRON, RETICCTPCT,  in the last 168 hours Urine Drug Screen: Drugs of Abuse     Component Value Date/Time   LABOPIA NONE DETECTED 09/19/2014 1705   COCAINSCRNUR NONE DETECTED  09/19/2014 1705   LABBENZ NONE DETECTED 09/19/2014 1705   AMPHETMU NONE DETECTED 09/19/2014 1705   THCU NONE DETECTED 09/19/2014 1705   LABBARB NONE DETECTED 09/19/2014 1705    Alcohol Level: No results found for this basename: ETH,  in the last 168 hours Urinalysis:  Recent Labs Lab 09/19/14 1705  COLORURINE AMBER*  LABSPEC 1.018  PHURINE 6.0  GLUCOSEU NEGATIVE  HGBUR LARGE*  BILIRUBINUR SMALL*  KETONESUR 15*  PROTEINUR 100*  UROBILINOGEN 1.0  NITRITE NEGATIVE  LEUKOCYTESUR LARGE*     Micro Results: Recent Results (from the past 240 hour(s))  CULTURE, BLOOD (ROUTINE X 2)     Status: None   Collection Time    09/19/14  4:51 PM      Result Value Ref Range Status   Specimen Description BLOOD RIGHT FOREARM   Final   Special Requests BOTTLES DRAWN AEROBIC AND ANAEROBIC 5CC    Final   Culture  Setup Time     Final   Value: 09/19/2014 22:20     [facility]formed [facility]   Culture     Final   Value: NO GROWTH 5 DAYS     [facility]formed at [facility]   Report Status 09/25/2014 FINAL   Final  URINE CULTURE     Status: None   Collection Time    09/19/14  5:05 PM      Result Value Ref Range Status   Specimen Description URINE, CLEAN CATCH   Final   Special Requests NONE   Final   Culture  Setup Time     Final   Value: 09/19/2014 22:28     [facility]formed [facility]   [place] Count     Final   Value: >=100,000 COLONIES/ML     [facility]formed at [facility]   Culture     Final   Value: CITROBACTER KOSERI     [facility]formed at [facility]   Report Status 09/22/2014 FINAL   Final   Organism ID, Bacteria CITROBACTER KOSERI   Final  CULTURE, BLOOD (ROUTINE X 2)     Status: None   Collection Time    09/23/14 10:05 PM      Result Value Ref Range Status   Specimen Description BLOOD LEFT HAND   Final   Special Requests BOTTLES DRAWN AEROBIC AND ANAEROBIC 10CC EA   Final   Culture  Setup Time     Final   Value: 09/24/2014 03:26     [facility]formed at [facility]   Culture     Final   Value:        BLOOD CULTURE RECEIVED NO GROWTH TO DATE CULTURE WILL BE HELD FOR 5 DAYS BEFORE ISSUING A FINAL NEGATIVE REPORT     [facility]formed at [facility]   Report Status PENDING   Incomplete  CULTURE, BLOOD (ROUTINE X 2)     Status: None   Collection Time    09/23/14 10:05 PM      Result Value Ref Range Status   Specimen Description BLOOD RIGHT ANTECUBITAL   Final   Special Requests BOTTLES DRAWN AEROBIC ONLY 5CC   Final   Culture  Setup Time     Final   Value: 09/24/2014 03:26     [facility]formed at [facility]   Culture     Final   Value:        BLOOD CULTURE RECEIVED NO GROWTH TO DATE CULTURE WILL BE HELD FOR 5 DAYS  BEFORE ISSUING A FINAL NEGATIVE REPORT     Performed at [facility]   Report Status PENDING   Incomplete    Studies/Results: Dg Chest 1 View  09/25/2014   CLINICAL DATA:  Fever  EXAM: CHEST - 1 VIEW  COMPARISON:  09/22/2014  FINDINGS: Degraded by rotation. Right pleural effusion associated airspace consolidation. Left lung base opacity. Small left pleural effusion not excluded. Aortic tortuosity, scattered atherosclerosis, and likely dilatation. No definite pneumothorax. Diffuse osteopenia. High-riding left humeral head suggests underlying rotator cuff pathology. Surgical clips right upper quadrant.  IMPRESSION: Degraded by rotation. Right pleural effusion and associated airspace consolidation; atelectasis versus pneumonia.   Electronically Signed   By: Jearld Lesch M.D.   On: 09/25/2014 01:21   Medications: I have reviewed the patient's current medications. Scheduled Meds: . budesonide  0.5 mg Nebulization BID  . ketotifen  1 drop Both Eyes BID  . memantine  10 mg Oral BID  . montelukast  10 mg Oral QHS  . pantoprazole (PROTONIX) IV  40 mg Intravenous Q24H  . piperacillin-tazobactam (ZOSYN)  IV  3.375 g Intravenous Q8H   Continuous Infusions:  PRN Meds:.acetaminophen, acetaminophen, albuterol, fluticasone, guaiFENesin-dextromethorphan, morphine injection Assessment/Plan: [name] is a 80yo woman w/ PMHx of dementia and asthma who presented to the ED with altered mental status and septic shock likely 2/2 UTI.   1. Septic Shock 2/2 UTI: Urine culture positive for citrobacter. Blood cultures NGTD. Patient continues to spike fevers, spiked to 102 overnight. Given Tylenol. Had fever of 100.4 this morning. Repeat blood cultures obtained. WBC count trending up again from 9.5>10.9. Patient likely aspirating. CXR showed a right lung base consolidation concerning for a developing pneumonia. Patient was switched from Ceftriaxone to Zosyn for broader coverage. Daughter instructed to not feed patient anymore because she is likely aspirating. Will have SLP evaluation and PT eval.  - Ceftriaxone  discontinued on 10/26 - Zosyn started 10/26 - Continue monitoring CBC  - SLP eval - PT eval - Chaplain and palliative care consulted and recommendations/interventions appreciated; patient is DNR   2. AMS: Likely 2/2 septic shock. The patient continues to improve; she was awake this morning and was tracking.  - Continue to monitor mental status  - SLP eval for diet recs  3. AKI: Cr 2.53 on admission, now 1.69. Baseline 1.2. AKI most likely due to sepsis.  - Continue to monitor   4. Diastolic CHF: Patient has trace edema on exam but does not appear volume overloaded.  - Continue oxygen therapy   5. Asthma: Patient had mild wheezing and crackles at right lung base on exam. She is on Albuterol nebs Q6H PRN wheezing and Pulmicort nebs BID. She is breathing comfortably on 2 L oxygen via [place] and saturating at 99-100%. Good air movement on exam.  - Continue Albuterol and Pulmicort nebs  - Oxygen therapy as needed  - Continue home robitussin for cough/congestion   6. Hypokalemia: K 3.3 today.  - Repleted with 40 mEq KCl  7. Thrombocytopenia: Platelets 92 at admission five days ago now down to 40>57 today. 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 Heparin d/c'd on 10/24.  - Continue SCDs  - HIT panel pending  - Continue to monitor plt count   Diet: DYS 1, awaiting SLP recs DVT/PE PPx: SCDs; stopped [place] heparin due to low plt count.  Dispo: Disposition is deferred at this time, awaiting improvement of current medical problems.   The patient does have a current PCP [name]  Kristeen Mans, MD) and does need [facility] follow-up appointment after discharge.  The patient does not have transportation limitations that hinder transportation to clinic appointments.  .Services Needed at time of discharge: Y = Yes, Blank = No PT:   OT:   RN:   Equipment:   Other:     LOS: 6 days   Rich Number, MD 09/25/2014, 12:48 PM

## 2014-09-25 NOTE — Progress Notes (Signed)
80yo female admitted 10/21 for septic shock, had been on Rocephin, now w/ temp of 102.4, to change to Zosyn for possible aspiration PNA and UTI.  Will start Zosyn 3.375g IV Q8H, though pt is at close to cut-off for lower dose of Zosyn and has had higher SCr during admission, so will monitor CBC, Cx, and CrCl for adjustments if needed.  Vernard GamblesVeronda Bradley [place], PharmD, BCPS 09/25/2014 3:25 AM

## 2014-09-26 LAB — BASIC METABOLIC PANEL
Anion gap: 15 (ref 5–15)
BUN: 21 mg/dL (ref 6–23)
CALCIUM: 9 mg/dL (ref 8.4–10.5)
CO2: 21 meq/L (ref 19–32)
CREATININE: 2.01 mg/dL — AB (ref 0.50–1.10)
Chloride: 116 mEq/L — ABNORMAL HIGH (ref 96–112)
GFR calc Af Amer: 25 mL/min — ABNORMAL LOW (ref >90)
GFR calc non Af Amer: 22 mL/min — ABNORMAL LOW (ref >90)
GLUCOSE: 102 mg/dL — AB (ref 70–99)
Potassium: 4 mEq/L (ref 3.7–5.3)
Sodium: 152 mEq/L — ABNORMAL HIGH (ref 137–147)

## 2014-09-26 LAB — CBC
HCT: 35.3 % — ABNORMAL LOW (ref 36.0–46.0)
HEMOGLOBIN: 11.4 g/dL — AB (ref 12.0–15.0)
MCH: 28.7 pg (ref 26.0–34.0)
MCHC: 32.3 g/dL (ref 30.0–36.0)
MCV: 88.9 fL (ref 78.0–100.0)
Platelets: 94 10*3/uL — ABNORMAL LOW (ref 150–400)
RBC: 3.97 MIL/uL (ref 3.87–5.11)
RDW: 15.5 % (ref 11.5–15.5)
WBC: 16.8 10*3/uL — ABNORMAL HIGH (ref 4.0–10.5)

## 2014-09-26 LAB — GLUCOSE, CAPILLARY: Glucose-Capillary: 121 mg/dL — ABNORMAL HIGH (ref 70–99)

## 2014-09-26 LAB — HEPARIN INDUCED THROMBOCYTOPENIA PNL
Heparin Induced Plt Ab: NEGATIVE
Patient O.D.: 0.037
UFH HIGH DOSE UFH H: 1 %
UFH LOW DOSE 0.1 IU/ML: 2 %
UFH Low Dose 0.5 IU/mL: 2 % Release
UFH SRA RESULT: NEGATIVE

## 2014-09-26 MED ORDER — DEXTROSE 5 % IV SOLN
INTRAVENOUS | Status: DC
  Administered 2014-09-26: 17:00:00 via INTRAVENOUS
  Administered 2014-09-27: 1000 mL via INTRAVENOUS
  Administered 2014-09-29: 02:00:00 via INTRAVENOUS

## 2014-09-26 MED ORDER — PIPERACILLIN-TAZOBACTAM IN DEX 2-0.25 GM/50ML IV SOLN
2.2500 g | Freq: Three times a day (TID) | INTRAVENOUS | Status: DC
  Administered 2014-09-26 – 2014-09-29 (×9): 2.25 g via INTRAVENOUS
  Filled 2014-09-26 (×10): qty 50

## 2014-09-26 NOTE — Progress Notes (Signed)
MD Rivet notified of Patient BP of  90/56 and resp 26. No new orders at this time

## 2014-09-26 NOTE — Progress Notes (Signed)
Pt seen and examined with Dr. Beckie Saltsivet. Please refer to resident note for details.  Pt still with fevers overnight. She is alert but non communicative  Exam:  Gen: NAD, awake, alert today but non verbal CV: RRR, normal heart sounds  Pulm: R basilar crackles + Abd: soft, non tender, BS +  Ext: No pedal edema   Assessment and Plan:  80 y/o woman p/w AMS found to be in septic shock possibly secondary to aspiration PNA/UTI  Septic shock:  - Recurrent fevers likely secondary to aspiration PNA  - SLP consult noted- will start dysphagia 1 diet  - Repeat blood cx - NGTD. Will monitor  - c/w zosyn for now. Pt still with fevers overnight and worsening leukocytosis. Will monitor - only low grade today  AMS:  - likely secondary to septic shock now improving  - CT head with no acute path  - No further w/u for now  AKI:  - Cr worsened today and pt now hypernatremic - Likely secondary to dehydration - Would restart D5W and monitor  Asthma:  - c/w nebs, pulmicort and singulair   Thrombocytopenia: - Resolving. Likely secondary to sepsis - Unlikely HIT but will f/u HIT panel Diastolic CHF:  - Stable for now. Will monitor  Case d/w daughter at bedside. Will get PT eval

## 2014-09-26 NOTE — Evaluation (Signed)
Clinical/Bedside Swallow Evaluation Patient Details  Name: [name] MRN: [id number] Date of Birth: 07/19/1929  Today's Date: 09/26/2014 Time: [id number] SLP Time Calculation (min): 25 min  Past Medical History:  Past Medical History  Diagnosis Date  . Asthma   . Hypertension   . Osteoporosis   . GERD (gastroesophageal reflux disease)   . Pulmonary embolism     hx of 4/09, sees Dr. Sherene SiresWert   . Leg edema   . DVT (deep venous thrombosis)     "?just one side"  . Pneumonia ?X1  . Asthmatic bronchitis   . Arthritis     "back mostly" (09/19/2014)  . Kidney stones   . Frequent UTI     "when she was younger" (09/19/2014)  . Dementia     "moderate to us; she talks to us in the family" (09/19/2014)   Past Surgical History:  Past Surgical History  Procedure Laterality Date  . Cholecystectomy    . Lithotripsy    . Knee arthroscopy Right   . Hip fracture surgery Left   . Fracture surgery    . Carpal tunnel release Right   . Dilation and curettage of uterus     HPI:  Patient was admitted with sepsis after found in vomit at home by daughter on 09/19/14. Patient with PMH of dementia, HTN, DVT of PE, subarachnoid hemorrhage, and asthma. Chest X-ray on 09/25/14 shows right pleural effusion and associated airspace consolidation; atelectasis versus pneumonia. History obtained from daughter at bedside who states patient normally able to walk a little with assistance and does speak occasionally. Per MD report, patient did not respond verbally and did not follow commands. Patient's daughter also reports that patient has good tolerance of Dys. 1 textures with thin liquids prior to admission, however, intake has decreased the past few days due to congestion and intake remains poor and coughing is a concern. Patient on 2L oxygen per nasal cannula.    Assessment / Plan / Recommendation Clinical Impression  Patient administered BSE on 09/26/14. Difficult to assess complete OME function due to  cognitive impairments; however, [facility] for tasks assessed. Patient demonstrates poor awareness of bolus, oral holding, delayed swallow initiation (~30-40 secs). Patient demonstrated overt s/s of aspiration with immediate reflexive cough following trials of ice chips and thin liquids and no overt s/s of aspiration with Dys. 1 textures and nectar-thick liquids, therefore, recommend diet change of Dys. 1 textures and nectar-thick liquids.  Recommend acute skilled SLP intervention and anticipate patient will need home health follow-up.    Aspiration Risk  Moderate    Diet Recommendation Dysphagia 1 (Puree);Nectar-thick liquid   Liquid Administration via: Cup;No straw Medication Administration: Crushed with puree Supervision: Staff to assist with self feeding;Full supervision/cueing for compensatory strategies;Trained caregiver to feed patient Compensations: Slow rate;Small sips/bites;Check for pocketing;Multiple dry swallows after each bite/sip Postural Changes and/or Swallow Maneuvers: Out of bed for meals;Seated upright 90 degrees    Other  Recommendations Oral Care Recommendations: Oral care BID Other Recommendations: Order thickener from pharmacy;Prohibited food (jello, ice cream, thin soups);Remove water pitcher   Follow Up Recommendations  Home health SLP;24 hour supervision/assistance    Frequency and Duration min 2x/week  2 weeks   Pertinent Vitals/Pain Slight temp this am    SLP Swallow Goals See care plan for details.  Swallow Study Prior Functional Status  Type of Home: House Available Help at Discharge:  (daughter)    General Date of Onset: 09/19/14 HPI: Patient was admitted with sepsis after found  in vomit at home by daughter on 09/19/14. Patient with PMH of dementia, HTN, DVT of PE, subarachnoid hemorrhage, and asthma. Chest X-ray on 09/25/14 shows right pleural effusion and associated airspace consolidation; atelectasis versus pneumonia. History obtained from  daughter at bedside who states patient normally able to walk a little with assistance and does speak occasionally. Per MD report, patient did not respond verbally and did not follow commands. Patient's daughter also reports that patient has good tolerance of Dys. 1 textures with thin liquids prior to admission, however, intake has decreased the past few days due to congestion and intake remains poor and coughing is a concern. Patient on 2L oxygen per nasal cannula.  Type of Study: Bedside swallow evaluation Previous Swallow Assessment: N/A Diet Prior to this Study: Dysphagia 1 (puree);Thin liquids Temperature Spikes Noted: No Respiratory Status: Nasal cannula (2L) History of Recent Intubation: No Behavior/Cognition: Alert;Decreased sustained attention;Doesn't follow directions;Pleasant mood;Requires cueing Oral Cavity - Dentition: Edentulous Self-Feeding Abilities: Total assist Patient Positioning: Upright in chair Baseline Vocal Quality: Aphonic Volitional Cough: Cognitively unable to elicit Volitional Swallow: Unable to elicit    Oral/Motor/Sensory Function Overall Oral Motor/Sensory Function: Appears within functional limits for tasks assessed Labial ROM: Within Functional Limits Labial Symmetry: Within Functional Limits Labial Strength: Reduced Labial Sensation: Reduced Lingual ROM: Within Functional Limits Lingual Symmetry: Within Functional Limits Lingual Strength: Reduced Lingual Sensation: Within Functional Limits Facial ROM: Within Functional Limits Facial Symmetry: Within Functional Limits Facial Strength: Within Functional Limits Facial Sensation: Within Functional Limits Velum: Within Functional Limits Mandible: Within Functional Limits   Ice Chips Ice chips: Impaired Presentation: Spoon Oral Phase Impairments: Impaired anterior to posterior transit;Reduced lingual movement/coordination;Poor awareness of bolus Oral Phase Functional Implications: Oral holding;Prolonged  oral transit Pharyngeal Phase Impairments: Suspected delayed Swallow;Cough - Immediate   Thin Liquid Thin Liquid: Impaired Presentation: Cup;Spoon Oral Phase Impairments: Poor awareness of bolus;Impaired anterior to posterior transit;Reduced lingual movement/coordination Oral Phase Functional Implications: Prolonged oral transit;Oral holding Pharyngeal  Phase Impairments: Cough - Immediate;Suspected delayed Swallow    Nectar Thick Nectar Thick Liquid: Within functional limits Presentation: Cup;Spoon   Honey Thick Honey Thick Liquid: Not tested   Puree Puree: Within functional limits Presentation: Spoon   Solid   GO    Solid: Not tested       Labarron Durnin 09/26/2014,12:28 PM

## 2014-09-26 NOTE — Evaluation (Signed)
Physical Therapy Evaluation Patient Details Name: [name] MRN: [id number] DOB: 10/13/1929 Today's Date: 09/26/2014   History of Present Illness  [name] is a 80yo woman w/ PMHx of dementia and asthma who presented to the ED with altered mental status and septic shock likely 2/2 UTI  Clinical Impression  Pt admitted with above. Pt currently with functional limitations due to the deficits listed below (see PT Problem List). Max assist +2 with most mobility; daughter reports pt was ambulatory prior to admission and is adamant she return home with 24 hour care from family. Daughter reports she has a hoyer lift and will be able to manage her mother with help from her husband. If they decide the patient cannot be cared for at home she will require SNF placement. Pt will benefit from skilled PT to increase their independence and safety with mobility to allow discharge to the venue listed below.      Follow Up Recommendations Home health PT;Supervision/Assistance - 24 hour (If daugther decides she cannot care for pt, will need SNF)    Equipment Recommendations   (TBD)    Recommendations for Other Services       Precautions / Restrictions Precautions Precautions: Fall Restrictions Weight Bearing Restrictions: No      Mobility  Bed Mobility Overal bed mobility: Needs Assistance;+2 for physical assistance Bed Mobility: Supine to Sit     Supine to sit: Max assist;+2 for physical assistance;HOB elevated     General bed mobility comments: Max assist +2. Pt does not respond to cues, leans posteriorly. Assist to bring trunk to supine and LEs off of bed. Second person to stabilize trunk in seated position.  Transfers Overall transfer level: Needs assistance Equipment used: None Transfers: Scientist, clinical (histocompatibility and immunogenetics)quat Pivot Transfers;Sit to/from Stand Sit to Stand: Max assist   Squat pivot transfers: Max assist;+2 physical assistance     General transfer comment: Max assist +2 for transfer, use of  bed sheet to facilitate hips forward as pt does not like gait belt. Pt had bowel movement during transfer. Leans heavily to posterior, Required bil knees to be blocked. Max assist with sit<>stand while nursing provided perineal hygiene. Able to tolerate for approximately 2 minutes in standing position with max assist, knees blocked, began to breath heavily as she was able to provide minimal assist.  Ambulation/Gait                Stairs            Wheelchair Mobility    Modified Rankin (Stroke Patients Only)       Balance Overall balance assessment: Needs assistance Sitting-balance support: Bilateral upper extremity supported;Feet supported Sitting balance-Leahy Scale: Zero   Postural control: Posterior lean Standing balance support: During functional activity Standing balance-Leahy Scale: Zero Standing balance comment: max assist, unable to use arms for support                             Pertinent Vitals/Pain Pain Assessment: No/denies pain    Home Living Family/patient expects to be discharged to:: Private residence Living Arrangements: Children Available Help at Discharge:  (daughter) Type of Home: House Home Access: Ramped entrance     Home Layout: One level Home Equipment: Cane - single point;Walker - 2 wheels;Bedside commode;Shower seat;Other (comment) Michiel Sites(Hoyer lift)      Prior Function Level of Independence: Needs assistance   Gait / Transfers Assistance Needed: Holds onto daugther when ambulating  ADL's / Homemaking  Assistance Needed: Daughter assists with bath/dress        Hand Dominance        Extremity/Trunk Assessment   Upper Extremity Assessment: Defer to OT evaluation           Lower Extremity Assessment: Difficult to assess due to impaired cognition         Communication   Communication: Expressive difficulties  Cognition Arousal/Alertness: Lethargic Behavior During Therapy: Flat affect Overall Cognitive  Status: History of cognitive impairments - at baseline                      General Comments General comments (skin integrity, edema, etc.): Pt had bowel movement, nursing provided hygiene while pt stood with PT during functional standing exercise however required max assist. Daughter reports pt was ambulatory prior to admission.    Exercises General Exercises - Lower Extremity Ankle Circles/Pumps: PROM;Both;10 reps;Supine      Assessment/Plan    PT Assessment Patient needs continued PT services  PT Diagnosis Difficulty walking;Generalized weakness   PT Problem List Decreased strength;Decreased range of motion;Decreased activity tolerance;Decreased balance;Decreased mobility;Decreased cognition;Decreased knowledge of use of DME;Decreased safety awareness  PT Treatment Interventions DME instruction;Gait training;Functional mobility training;Therapeutic activities;Therapeutic exercise;Balance training;Neuromuscular re-education;Cognitive remediation;Patient/family education   PT Goals (Current goals can be found in the Care Plan section) Acute Rehab PT Goals Patient Stated Goal: None stated PT Goal Formulation: With family Time For Goal Achievement: 10/03/14 Potential to Achieve Goals: Fair    Frequency Min 3X/week   Barriers to discharge        Co-evaluation               End of Session Equipment Utilized During Treatment: Other (comment) (Sheet in place of gait belt; pt does not tolerate gait belt) Activity Tolerance: Patient limited by fatigue;Other (comment) (limited by cognition) Patient left: in chair;with call bell/phone within reach;with family/visitor present;with nursing/sitter in room Nurse Communication: Mobility status;Need for lift equipment;Other (comment) (pt had bowel movement)         Time: [id number] PT Time Calculation (min): 41 min   Charges:   PT Evaluation $Initial PT Evaluation Tier I: 1 Procedure PT Treatments $Therapeutic  Activity: 23-37 mins   PT G Codes:        Charlsie MerlesLogan Secor Haddon Fyfe, [place] CarolinaPT [phone]   [name], Tecora Eustache S 09/26/2014, 12:24 PM

## 2014-09-26 NOTE — Evaluation (Signed)
The assessment and plan has been reviewed and SLP is in agreement. Jeffree Cazeau, M.A., CCC-SLP [phone]  

## 2014-09-26 NOTE — Progress Notes (Signed)
Subjective: Patient awake and sitting up in chair. She continues to be more alert. She is still not verbalizing. Awaiting SLP and PT evaluations. Her WBC count is trending up again, 10.9>16.8. Blood cultures NGTD. She is on Zosyn for suspected aspiration pneumonia.   Objective: Vital signs in last 24 hours: Filed Vitals:   09/25/14 2220 09/26/14 0106 09/26/14 0520 09/26/14 1018  BP: 96/72 112/57 102/52   Pulse: 126 124 107   Temp: 100.7 F (38.2 C) 100.8 F (38.2 C) 100.7 F (38.2 C)   TempSrc: Oral Oral Oral   Resp: 26 24 18    Height:      Weight:      SpO2: 94% 93% 100% 100%   Weight change:   Intake/Output Summary (Last 24 hours) at 09/26/14 1202 Last data filed at 09/26/14 0900  Gross per 24 hour  Intake 1058.17 ml  Output    500 ml  Net 558.17 ml   Physical Exam General: awake, sitting up in chair, tracking movements and voices HEENT: [place]/AT, EOMI, mucus membranes moist CV: RRR, normal S1/S2, no m/g/r Pulm: crackles heard at right lung base Abd: BS+, soft, non-tender Ext: warm, trace-1+ pitting edema Neuro: awake, nonverbal, facial symmetry  Lab Results: Basic Metabolic Panel:  Recent Labs Lab 09/25/14 0428 09/26/14 0939  NA 149* 152*  K 3.3* 4.0  CL 112 116*  CO2 24 21  GLUCOSE 125* 102*  BUN 19 21  CREATININE 1.69* 2.01*  CALCIUM 9.3 9.0   Liver Function Tests:  Recent Labs Lab 09/22/14 0445 09/24/14 0550  AST 45* 27  ALT 38* 29  ALKPHOS 274* 102  BILITOT 1.5* 1.6*  PROT 5.3* 5.9*  ALBUMIN 2.3* 2.3*   No results found for this basename: LIPASE, AMYLASE,  in the last 168 hours  Recent Labs Lab 09/19/14 1720  AMMONIA 40   CBC:  Recent Labs Lab 09/19/14 1651  09/25/14 0428 09/26/14 0939  WBC 18.7*  < > 10.9* 16.8*  NEUTROABS 17.6*  --   --   --   HGB 14.4  < > 11.9* 11.4*  HCT 46.3*  < > 35.9* 35.3*  MCV 93.3  < > 86.3 88.9  PLT 92*  < > 57* 94*  < > = values in this interval not displayed. Cardiac Enzymes: No results  found for this basename: CKTOTAL, CKMB, CKMBINDEX, TROPONINI,  in the last 168 hours BNP:  Recent Labs Lab 09/19/14 1651  PROBNP 6958.0*   D-Dimer: No results found for this basename: DDIMER,  in the last 168 hours CBG:  Recent Labs Lab 09/21/14 0819 09/22/14 0739 09/23/14 0754 09/24/14 0756 09/25/14 0802 09/26/14 0758  GLUCAP 68* 92 133* 111* 112* 121*   Hemoglobin A1C: No results found for this basename: HGBA1C,  in the last 168 hours Fasting Lipid Panel: No results found for this basename: CHOL, HDL, LDLCALC, TRIG, CHOLHDL, LDLDIRECT,  in the last 168 hours Thyroid Function Tests: No results found for this basename: TSH, T4TOTAL, FREET4, T3FREE, THYROIDAB,  in the last 168 hours Coagulation: No results found for this basename: LABPROT, INR,  in the last 168 hours Anemia Panel: No results found for this basename: VITAMINB12, FOLATE, FERRITIN, TIBC, IRON, RETICCTPCT,  in the last 168 hours Urine Drug Screen: Drugs of Abuse     Component Value Date/Time   LABOPIA NONE DETECTED 09/19/2014 1705   COCAINSCRNUR NONE DETECTED 09/19/2014 1705   LABBENZ NONE DETECTED 09/19/2014 1705   AMPHETMU NONE DETECTED 09/19/2014 1705   THCU  NONE DETECTED 09/19/2014 1705   LABBARB NONE DETECTED 09/19/2014 1705    Alcohol Level: No results found for this basename: ETH,  in the last 168 hours Urinalysis:  Recent Labs Lab 09/19/14 1705  COLORURINE AMBER*  LABSPEC 1.018  PHURINE 6.0  GLUCOSEU NEGATIVE  HGBUR LARGE*  BILIRUBINUR SMALL*  KETONESUR 15*  PROTEINUR 100*  UROBILINOGEN 1.0  NITRITE NEGATIVE  LEUKOCYTESUR LARGE*    Micro Results: Recent Results (from the past 240 hour(s))  CULTURE, BLOOD (ROUTINE X 2)     Status: None   Collection Time    09/19/14  4:51 PM      Result Value Ref Range Status   Specimen Description BLOOD RIGHT FOREARM   Final   Special Requests BOTTLES DRAWN AEROBIC AND ANAEROBIC 5CC   Final   Culture  Setup Time     Final   Value: 09/19/2014  22:20     [facility]   Culture     Final   Value: NO GROWTH 5 DAYS     [facility] at [facility]   Report Status 09/25/2014 FINAL   Final  URINE CULTURE     Status: None   Collection Time    09/19/14  5:05 PM      Result Value Ref Range Status   Specimen Description URINE, CLEAN CATCH   Final   Special Requests NONE   Final   Culture  Setup Time     Final   Value: 09/19/2014 22:28     [facility]   Colony Count     Final   Value: >=100,000 COLONIES/ML     [facility]formed at [facility]   Culture     Final   Value: CITROBACTER KOSERI     Performed at [facility]   Report Status 09/22/2014 FINAL   Final   Organism ID, Bacteria CITROBACTER KOSERI   Final  CULTURE, BLOOD (ROUTINE X 2)     Status: None   Collection Time    09/23/14 10:05 PM      Result Value Ref Range Status   Specimen Description BLOOD LEFT HAND   Final   Special Requests BOTTLES DRAWN AEROBIC AND ANAEROBIC 10CC EA   Final   Culture  Setup Time     Final   Value: 09/24/2014 03:26     Performed at [facility]   Culture     Final   Value:        BLOOD CULTURE RECEIVED NO GROWTH TO DATE CULTURE WILL BE HELD FOR 5 DAYS BEFORE ISSUING A FINAL NEGATIVE REPORT     Performed at [facility]   Report Status PENDING   Incomplete  CULTURE, BLOOD (ROUTINE X 2)     Status: None   Collection Time    09/23/14 10:05 PM      Result Value Ref Range Status   Specimen Description BLOOD RIGHT ANTECUBITAL   Final   Special Requests BOTTLES DRAWN AEROBIC ONLY 5CC   Final   Culture  Setup Time     Final   Value: 09/24/2014 03:26     Performed at [facility]   Culture     Final   Value:        BLOOD CULTURE RECEIVED NO GROWTH TO DATE CULTURE WILL BE HELD FOR 5 DAYS BEFORE ISSUING A FINAL NEGATIVE REPORT     Performed at [facility]   Report Status PENDING  Incomplete  CULTURE, BLOOD (ROUTINE X 2)     Status: None   Collection  Time    09/25/14 12:00 AM      Result Value Ref Range Status   Specimen Description BLOOD LEFT HAND   Final   Special Requests BOTTLES DRAWN AEROBIC ONLY 10CC   Final   Culture  Setup Time     Final   Value: 09/25/2014 09:03     Performed at [facility]   Culture     Final   Value:        BLOOD CULTURE RECEIVED NO GROWTH TO DATE CULTURE WILL BE HELD FOR 5 DAYS BEFORE ISSUING A FINAL NEGATIVE REPORT     Performed at [facility]   Report Status PENDING   Incomplete  CULTURE, BLOOD (ROUTINE X 2)     Status: None   Collection Time    09/25/14 12:10 AM      Result Value Ref Range Status   Specimen Description BLOOD RIGHT HAND   Final   Special Requests BOTTLES DRAWN AEROBIC AND ANAEROBIC 10CC EACH   Final   Culture  Setup Time     Final   Value: 09/25/2014 09:04     Performed at [facility]   Culture     Final   Value:        BLOOD CULTURE RECEIVED NO GROWTH TO DATE CULTURE WILL BE HELD FOR 5 DAYS BEFORE ISSUING A FINAL NEGATIVE REPORT     Performed at [facility]   Report Status PENDING   Incomplete   Studies/Results: Dg Chest 1 View  09/25/2014   CLINICAL DATA:  Fever  EXAM: CHEST - 1 VIEW  COMPARISON:  09/22/2014  FINDINGS: Degraded by rotation. Right pleural effusion associated airspace consolidation. Left lung base opacity. Small left pleural effusion not excluded. Aortic tortuosity, scattered atherosclerosis, and likely dilatation. No definite pneumothorax. Diffuse osteopenia. High-riding left humeral head suggests underlying rotator cuff pathology. Surgical clips right upper quadrant.  IMPRESSION: Degraded by rotation. Right pleural effusion and associated airspace consolidation; atelectasis versus pneumonia.   Electronically Signed   By: Jearld Lesch M.D.   On: 09/25/2014 01:21   Medications: I have reviewed the patient's current medications. Scheduled Meds: . budesonide  0.5 mg Nebulization BID  . feeding supplement (ENSURE COMPLETE)  237  mL Oral BID BM  . ketotifen  1 drop Both Eyes BID  . memantine  10 mg Oral BID  . montelukast  10 mg Oral QHS  . pantoprazole (PROTONIX) IV  40 mg Intravenous Q24H  . piperacillin-tazobactam (ZOSYN)  IV  3.375 g Intravenous Q8H   Continuous Infusions:  PRN Meds:.acetaminophen, acetaminophen, albuterol, fluticasone, guaiFENesin-dextromethorphan, morphine injection Assessment/Plan: Ms. Daloia is a 80yo woman w/ PMHx of dementia and asthma who presented to the ED with altered mental status and septic shock likely 2/2 UTI.   1. Septic Shock: Patient with recurrent fevers, spiked 101.8 last night. She was febrile at 100.7 this morning. CXR consistent with RLL pneumonia, patient has RLL crackles on exam. WBC count trending up to 16.8 today. Patient is on Zosyn currently. Blood cultures NGTD. Septic shock originally thought to be due to UTI, but now more likely aspiration pneumonia.  - Continue Zosyn (started 10/26) - Continue monitoring CBC  - SLP eval --> dys 1, thick nectar liquids - PT eval --> home health PT, will need SNF if daughter cannot care for pt - Chaplain and palliative care consulted and recommendations/interventions appreciated;  patient is DNR   2. AMS- Improving: Likely 2/2 septic shock. The patient continues to improve; she was awake this morning and sitting up in her chair.  - Continue to monitor mental status   3. AKI: Cr 2.53 on admission, now trending back up to 1.69>2.01. Baseline 1.2. AKI most likely due to sepsis.  - Continue to monitor   4. Diastolic CHF: Patient has trace edema on exam but does not appear volume overloaded.  - Continue oxygen therapy   5. Asthma: Patient continues to have mild wheezing and crackles at right lung base on exam. She is on Albuterol nebs Q6H PRN wheezing and Pulmicort nebs BID. She is breathing comfortably on 2 L oxygen via [place] and saturating at 99-100%. Good air movement on exam.  - Continue Albuterol and Pulmicort nebs  - Oxygen therapy  as needed  - Continue home robitussin for cough/congestion    6. Hypokalemia: K 4.0 today, up from 3.3 yesterday. Continue to monitor.   7. Thrombocytopenia: Platelets 92 at admission five days ago, trended down to 40>57 and now trending back up to 94 today. 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 Heparin d/c'd on 10/24.  - Continue SCDs  - HIT panel pending  - Continue to monitor plt count   Diet: DYS 1, thick nectar liquids DVT/PE PPx: SCDs; stopped  heparin due to low plt count.  Dispo: Disposition is deferred at this time, awaiting improvement of current medical problems.   The patient does have a current PCP Jethro Bastos, MD) and does need an [facility] hospital follow-up appointment after discharge.  The patient does not have transportation limitations that hinder transportation to clinic appointments.  .Services Needed at time of discharge: Y = Yes, Blank = No PT:   OT:   RN:   Equipment:   Other:     LOS: 7 days   Rich Number, MD 09/26/2014, 12:02 PM

## 2014-09-26 NOTE — Progress Notes (Signed)
ANTIBIOTIC CONSULT NOTE - FOLLOW UP  Pharmacy Consult for Zosyn Indication: aspiration pneumonia and UTI coverage  No Known Allergies  Patient Measurements: Height: 4\' 11"  (149.9 cm) Weight: 145 lb 9.6 oz (66.044 kg) IBW/kg (Calculated) : 43.2 kg  Vital Signs: Temp: 97.7 F (36.5 C) (10/27 1440) Temp Source: Oral (10/27 1440) BP: 90/56 mmHg (10/27 1440) Pulse Rate: 91 (10/27 1440) Intake/Output from previous day: 10/26 0701 - 10/27 0700 In: 1058.2 [P.O.:60; I.V.:939.2; IV Piggyback:59] Out: 500 [Urine:500] Intake/Output from this shift: Total I/O In: 25 [P.O.:25] Out: -   Labs:  Recent Labs  09/24/14 0550 09/25/14 0428 09/26/14 0939  WBC 9.5 10.9* 16.8*  HGB 13.5 11.9* 11.4*  PLT 40* 57* 94*  CREATININE 1.59* 1.69* 2.01*   Estimated Creatinine Clearance: 17.5 ml/min (by C-G formula based on Cr of 2.01).  Assessment:  Day #2 Zosyn. Tmax 101.8, WBC up to 16.8.  BUN/creatinine have trended up, crcl now < 20 ml/min.  Repeat blood cultures from 10/26 - no growth to date.  Goal of Therapy:   appropriate Zosyn dose for renal function and infection  Plan:   Change Zosyn from 3.375 grams IV q8hrs (each over 4 hours) to 2.25 grams IV q8hrs.  Will follow renal function for any need to re-adjust.  Dennie FettersEgan, Kirsta Probert Donovan, RPh Pager: [phone] 09/26/2014,4:24 PM

## 2014-09-27 DIAGNOSIS — E870 Hyperosmolality and hypernatremia: Secondary | ICD-10-CM

## 2014-09-27 LAB — BASIC METABOLIC PANEL
Anion gap: 12 (ref 5–15)
BUN: 18 mg/dL (ref 6–23)
CO2: 23 mEq/L (ref 19–32)
Calcium: 9.3 mg/dL (ref 8.4–10.5)
Chloride: 115 mEq/L — ABNORMAL HIGH (ref 96–112)
Creatinine, Ser: 1.75 mg/dL — ABNORMAL HIGH (ref 0.50–1.10)
GFR calc Af Amer: 30 mL/min — ABNORMAL LOW (ref >90)
GFR, EST NON AFRICAN AMERICAN: 26 mL/min — AB (ref >90)
GLUCOSE: 125 mg/dL — AB (ref 70–99)
POTASSIUM: 3.5 meq/L — AB (ref 3.7–5.3)
Sodium: 150 mEq/L — ABNORMAL HIGH (ref 137–147)

## 2014-09-27 LAB — CBC
HCT: 32.8 % — ABNORMAL LOW (ref 36.0–46.0)
HEMOGLOBIN: 10.8 g/dL — AB (ref 12.0–15.0)
MCH: 28.6 pg (ref 26.0–34.0)
MCHC: 32.9 g/dL (ref 30.0–36.0)
MCV: 87 fL (ref 78.0–100.0)
Platelets: 135 10*3/uL — ABNORMAL LOW (ref 150–400)
RBC: 3.77 MIL/uL — ABNORMAL LOW (ref 3.87–5.11)
RDW: 15.2 % (ref 11.5–15.5)
WBC: 16.4 10*3/uL — ABNORMAL HIGH (ref 4.0–10.5)

## 2014-09-27 LAB — GLUCOSE, CAPILLARY: Glucose-Capillary: 122 mg/dL — ABNORMAL HIGH (ref 70–99)

## 2014-09-27 MED ORDER — HEPARIN SODIUM (PORCINE) 5000 UNIT/ML IJ SOLN
5000.0000 [IU] | Freq: Three times a day (TID) | INTRAMUSCULAR | Status: DC
  Administered 2014-09-27 – 2014-09-29 (×5): 5000 [IU] via SUBCUTANEOUS
  Filled 2014-09-27 (×9): qty 1

## 2014-09-27 MED ORDER — STARCH (THICKENING) PO POWD
ORAL | Status: DC | PRN
  Filled 2014-09-27: qty 227

## 2014-09-27 MED ORDER — POTASSIUM CHLORIDE 20 MEQ/15ML (10%) PO LIQD
40.0000 meq | Freq: Once | ORAL | Status: AC
  Administered 2014-09-27: 40 meq via ORAL
  Filled 2014-09-27: qty 30

## 2014-09-27 NOTE — Progress Notes (Signed)
Subjective: Patient alert and awake this morning. Still nonverbal, likely baseline. Daughter reports she ate her oatmeal this morning.  WBC count slightly trended down from 16.8>16.4. Patient on Zosyn. No fevers overnight. BP 90/56 yesterday so D5 started. BPs now improved to 110s-120s systolic.  Objective: Vital signs in last 24 hours: Filed Vitals:   09/26/14 1440 09/26/14 2104 09/27/14 0601 09/27/14 1009  BP: 90/56 122/56 114/55   Pulse: 91 105 92   Temp: 97.7 F (36.5 C) 98.4 F (36.9 C) 99.3 F (37.4 C)   TempSrc: Oral Oral Oral   Resp: 26 22 20    Height:      Weight:      SpO2: 94% 99% 99% 96%   Weight change:   Intake/Output Summary (Last 24 hours) at 09/27/14 1310 Last data filed at 09/27/14 0928  Gross per 24 hour  Intake    850 ml  Output    350 ml  Net    500 ml   Physical Exam General: alert, awake, sitting up in bed, NAD HEENT: [place]/AT, EOMI, mucus membranes moist CV: RRR, normal S1/S2, no m/g/r Pulm: mild wheezing bilaterally, crackles at right lung base Abd: BS+, soft, non-tender Ext: warm, trace edema Neuro: alert, awake, facial symmetry  Lab Results: Basic Metabolic Panel:  Recent Labs Lab 09/26/14 0939 09/27/14 0830  NA 152* 150*  K 4.0 3.5*  CL 116* 115*  CO2 21 23  GLUCOSE 102* 125*  BUN 21 18  CREATININE 2.01* 1.75*  CALCIUM 9.0 9.3   Liver Function Tests:  Recent Labs Lab 09/22/14 0445 09/24/14 0550  AST 45* 27  ALT 38* 29  ALKPHOS 274* 102  BILITOT 1.5* 1.6*  PROT 5.3* 5.9*  ALBUMIN 2.3* 2.3*   No results found for this basename: LIPASE, AMYLASE,  in the last 168 hours No results found for this basename: AMMONIA,  in the last 168 hours CBC:  Recent Labs Lab 09/26/14 0939 09/27/14 0830  WBC 16.8* 16.4*  HGB 11.4* 10.8*  HCT 35.3* 32.8*  MCV 88.9 87.0  PLT 94* 135*   Cardiac Enzymes: No results found for this basename: CKTOTAL, CKMB, CKMBINDEX, TROPONINI,  in the last 168 hours BNP: No results found for this  basename: PROBNP,  in the last 168 hours D-Dimer: No results found for this basename: DDIMER,  in the last 168 hours CBG:  Recent Labs Lab 09/22/14 0739 09/23/14 0754 09/24/14 0756 09/25/14 0802 09/26/14 0758 09/27/14 0802  GLUCAP 92 133* 111* 112* 121* 122*   Hemoglobin A1C: No results found for this basename: HGBA1C,  in the last 168 hours Fasting Lipid Panel: No results found for this basename: CHOL, HDL, LDLCALC, TRIG, CHOLHDL, LDLDIRECT,  in the last 168 hours Thyroid Function Tests: No results found for this basename: TSH, T4TOTAL, FREET4, T3FREE, THYROIDAB,  in the last 168 hours Coagulation: No results found for this basename: LABPROT, INR,  in the last 168 hours Anemia Panel: No results found for this basename: VITAMINB12, FOLATE, FERRITIN, TIBC, IRON, RETICCTPCT,  in the last 168 hours Urine Drug Screen: Drugs of Abuse     Component Value Date/Time   LABOPIA NONE DETECTED 09/19/2014 1705   COCAINSCRNUR NONE DETECTED 09/19/2014 1705   LABBENZ NONE DETECTED 09/19/2014 1705   AMPHETMU NONE DETECTED 09/19/2014 1705   THCU NONE DETECTED 09/19/2014 1705   LABBARB NONE DETECTED 09/19/2014 1705    Alcohol Level: No results found for this basename: ETH,  in the last 168 hours Urinalysis: No results found  for this basename: COLORURINE, APPERANCEUR, LABSPEC, PHURINE, GLUCOSEU, HGBUR, BILIRUBINUR, KETONESUR, PROTEINUR, UROBILINOGEN, NITRITE, LEUKOCYTESUR,  in the last 168 hours   Micro Results: Recent Results (from the past 240 hour(s))  CULTURE, BLOOD (ROUTINE X 2)     Status: None   Collection Time    09/19/14  4:51 PM      Result Value Ref Range Status   Specimen Description BLOOD RIGHT FOREARM   Final   Special Requests BOTTLES DRAWN AEROBIC AND ANAEROBIC 5CC   Final   Culture  Setup Time     Final   Value: 09/19/2014 22:20     Performed at [facility]   Culture     Final   Value: NO GROWTH 5 DAYS     Performed at [facility]   Report Status  09/25/2014 FINAL   Final  URINE CULTURE     Status: None   Collection Time    09/19/14  5:05 PM      Result Value Ref Range Status   Specimen Description URINE, CLEAN CATCH   Final   Special Requests NONE   Final   Culture  Setup Time     Final   Value: 09/19/2014 22:28     Performed at [facility] Count     Final   Value: >=100,000 COLONIES/ML     Performed at [facility]   Culture     Final   Value: CITROBACTER KOSERI     Performed at [facility]   Report Status 09/22/2014 FINAL   Final   Organism ID, Bacteria CITROBACTER KOSERI   Final  CULTURE, BLOOD (ROUTINE X 2)     Status: None   Collection Time    09/23/14 10:05 PM      Result Value Ref Range Status   Specimen Description BLOOD LEFT HAND   Final   Special Requests BOTTLES DRAWN AEROBIC AND ANAEROBIC 10CC EA   Final   Culture  Setup Time     Final   Value: 09/24/2014 03:26     Performed at [facility]   Culture     Final   Value:        BLOOD CULTURE RECEIVED NO GROWTH TO DATE CULTURE WILL BE HELD FOR 5 DAYS BEFORE ISSUING A FINAL NEGATIVE REPORT     Performed at [facility]   Report Status PENDING   Incomplete  CULTURE, BLOOD (ROUTINE X 2)     Status: None   Collection Time    09/23/14 10:05 PM      Result Value Ref Range Status   Specimen Description BLOOD RIGHT ANTECUBITAL   Final   Special Requests BOTTLES DRAWN AEROBIC ONLY 5CC   Final   Culture  Setup Time     Final   Value: 09/24/2014 03:26     Performed at [facility]   Culture     Final   Value:        BLOOD CULTURE RECEIVED NO GROWTH TO DATE CULTURE WILL BE HELD FOR 5 DAYS BEFORE ISSUING A FINAL NEGATIVE REPORT     Performed at [facility]   Report Status PENDING   Incomplete  CULTURE, BLOOD (ROUTINE X 2)     Status: None   Collection Time    09/25/14 12:00 AM      Result Value Ref Range Status   Specimen Description BLOOD LEFT HAND   Final   Special Requests  BOTTLES DRAWN AEROBIC  ONLY 10CC   Final   Culture  Setup Time     Final   Value: 09/25/2014 09:03     [facility]   Culture     Final   Value:        BLOOD CULTURE RECEIVED NO GROWTH TO DATE CULTURE WILL BE HELD FOR 5 DAYS BEFORE ISSUING A FINAL NEGATIVE REPORT     [facility]   Report Status PENDING   Incomplete  CULTURE, BLOOD (ROUTINE X 2)     Status: None   Collection Time    09/25/14 12:10 AM      Result Value Ref Range Status   Specimen Description BLOOD RIGHT HAND   Final   Special Requests BOTTLES DRAWN AEROBIC AND ANAEROBIC 10CC EACH   Final   Culture  Setup Time     Final   Value: 09/25/2014 09:04     [facility]   Culture     Final   Value:        BLOOD CULTURE RECEIVED NO GROWTH TO DATE CULTURE WILL BE HELD FOR 5 DAYS BEFORE ISSUING A FINAL NEGATIVE REPORT     [facility]   Report Status PENDING   Incomplete   Studies/Results: No results found. Medications: I have reviewed the patient's current medications. Scheduled Meds: . budesonide  0.5 mg Nebulization BID  . feeding supplement (ENSURE COMPLETE)  237 mL Oral BID BM  . ketotifen  1 drop Both Eyes BID  . memantine  10 mg Oral BID  . montelukast  10 mg Oral QHS  . pantoprazole (PROTONIX) IV  40 mg Intravenous Q24H  . piperacillin-tazobactam (ZOSYN)  IV  2.25 g Intravenous Q8H   Continuous Infusions: . dextrose 50 mL/hr at 09/26/14 1725   PRN Meds:.acetaminophen, acetaminophen, albuterol, fluticasone, food thickener, guaiFENesin-dextromethorphan, morphine injection Assessment/Plan: [name] is a 80yo woman w/ PMHx of dementia and asthma who presented to the ED with altered mental status and septic shock likely 2/2 UTI.   1. Septic Shock: Patient has been afebrile over last 24 hours. WBC count slightly down from 16.8>16.4. Mild bilateral wheezing and RLL crackles on exam. Patient is on Zosyn currently. Blood cultures NGTD. Septic shock originally  thought to be due to UTI, but now more likely aspiration pneumonia.  - Continue Zosyn (started 10/26)  - Continue monitoring CBC  - SLP eval --> dys 1, thick nectar liquids  - PT eval --> home health PT, will need SNF if daughter cannot care for pt  - Chaplain and palliative care consulted and recommendations/interventions appreciated; patient is DNR   2. AMS- Improving: Likely 2/2 septic shock. The patient continues to improve; she was awake this morning and sitting up in her chair. She is eating well.  - Continue to monitor mental status   3. AKI: Cr 2.53 on admission, was trending back up to 1.69>2.01, but now trending down again to 1.75. Baseline 1.2. AKI most likely due to sepsis.  - Continue to monitor   4. Diastolic CHF: Patient has trace edema on exam but does not appear volume overloaded.  - Continue oxygen therapy   5. Asthma: Patient continues to have mild wheezing and crackles at right lung base on exam. She is on Albuterol nebs Q6H PRN wheezing and Pulmicort nebs BID. She is breathing comfortably on 2 L oxygen via [place] and saturating at 99-100%. Good air movement on exam.  -  Continue Albuterol and Pulmicort nebs  - Oxygen therapy as needed  - Continue home robitussin for cough/congestion   6. Hypokalemia: K 3.5 today, down from 4.0 yesterday. Continue to monitor.  - Repleted potassium  7. Hypernatremia: Na 149 on 10/26, trended up to 152, now 150. Patient on D50 fluids.  - Continue to monitor  8. Thrombocytopenia: Platelets 92 at admission five days ago, trended down to 40>57 and now trending back up to 94>135 today. 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 Heparin d/c'd on 10/24. HIT panel negative.  - Will resume Heparin  - HIT panel negative - Continue to monitor plt count   Diet: DYS 1, thick nectar liquids  DVT/PE PPx: Heparin SQ Dispo: Disposition is deferred at this time, awaiting improvement of current medical problems.   The patient does have a current PCP  Jethro Bastos, MD) and does need an [facility] hospital follow-up appointment after discharge.  The patient does not have transportation limitations that hinder transportation to clinic appointments.  .Services Needed at time of discharge: Y = Yes, Blank = No PT:   OT:   RN:   Equipment:   Other:     LOS: 8 days   Rich Number, MD 09/27/2014, 1:10 PM

## 2014-09-27 NOTE — Progress Notes (Signed)
SLP Cancellation Note  Patient Details Name: Kristen HumphreyMargaret L Gwaltney MRN: [id number] DOB: 11/03/1929   Cancelled treatment:       Reason Eval/Treat Not Completed: Medical issues which prohibited therapy; spoke with daughter re: swallowing precautions and necessity of nectar-thickened liquids and swallowing strategies   ADAMS,PAT, M.S., CCC-SLP 09/27/2014, 3:53 PM

## 2014-09-27 NOTE — Progress Notes (Signed)
Pt seen and examined with Dr. Beckie Saltsivet. Please refer to resident note for details.   Pt is afebrile so far today and was afebrile overnight. Daughter states pt is tolerating puree diet. Pt remains non verbal  Exam:  Gen: NAD, awake, alert today but non verbal  CV: RRR, normal heart sounds  Pulm: R basilar crackles +, scattered wheeze + Abd: soft, non tender, BS +  Ext: No pedal edema   Assessment and Plan:  80 y/o woman p/w AMS found to be in septic shock possibly secondary to aspiration PNA/UTI  Septic shock:  - Pt with likely aspiration PNA - Tolerating dysphagia 1 diet  - Repeat blood cx - NGTD. Will monitor  - c/w zosyn for now. Pt now afebrile - Still with leukocytosis. Will monitor on abx  AMS:  - likely secondary to septic shock now improving  - CT head with no acute path  - No further w/u for now   AKI:  - Cr improved with IVF and PO intake - c/w D5W for now given hypernatremia which has improved a little on IVF  Asthma:  - c/w nebs, pulmicort and singulair   Thrombocytopenia:  - Resolving. Likely secondary to sepsis  - HIT panel negative. Resume heparin

## 2014-09-28 DIAGNOSIS — R531 Weakness: Secondary | ICD-10-CM

## 2014-09-28 LAB — GLUCOSE, CAPILLARY: Glucose-Capillary: 90 mg/dL (ref 70–99)

## 2014-09-28 LAB — CBC
HCT: 33 % — ABNORMAL LOW (ref 36.0–46.0)
Hemoglobin: 10.4 g/dL — ABNORMAL LOW (ref 12.0–15.0)
MCH: 27.8 pg (ref 26.0–34.0)
MCHC: 31.5 g/dL (ref 30.0–36.0)
MCV: 88.2 fL (ref 78.0–100.0)
Platelets: 177 10*3/uL (ref 150–400)
RBC: 3.74 MIL/uL — ABNORMAL LOW (ref 3.87–5.11)
RDW: 15.5 % (ref 11.5–15.5)
WBC: 13.5 10*3/uL — AB (ref 4.0–10.5)

## 2014-09-28 LAB — BASIC METABOLIC PANEL
Anion gap: 15 (ref 5–15)
BUN: 15 mg/dL (ref 6–23)
CO2: 23 meq/L (ref 19–32)
Calcium: 8.9 mg/dL (ref 8.4–10.5)
Chloride: 114 mEq/L — ABNORMAL HIGH (ref 96–112)
Creatinine, Ser: 1.59 mg/dL — ABNORMAL HIGH (ref 0.50–1.10)
GFR calc non Af Amer: 29 mL/min — ABNORMAL LOW (ref >90)
GFR, EST AFRICAN AMERICAN: 34 mL/min — AB (ref >90)
Glucose, Bld: 95 mg/dL (ref 70–99)
Potassium: 3.4 mEq/L — ABNORMAL LOW (ref 3.7–5.3)
Sodium: 152 mEq/L — ABNORMAL HIGH (ref 137–147)

## 2014-09-28 MED ORDER — POTASSIUM CHLORIDE 20 MEQ/15ML (10%) PO LIQD
40.0000 meq | Freq: Once | ORAL | Status: AC
  Administered 2014-09-28: 40 meq via ORAL
  Filled 2014-09-28: qty 30

## 2014-09-28 NOTE — Progress Notes (Signed)
Sayed Apostol,PT Acute Rehabilitation [phone] (pager)  

## 2014-09-28 NOTE — Progress Notes (Signed)
Speech Language Pathology Treatment: Dysphagia  Patient Details Name: Kristen HumphreyMargaret L Diekmann MRN: [id number] DOB: 04/11/1929 Today's Date: 09/28/2014 Time: [id number] SLP Time Calculation (min): 22 min  Assessment / Plan / Recommendation Clinical Impression  Skilled treatment session focused on dysphagia goals. Upon arrival, patient was lying supine in bed with daughter present. Student facilitated session by providing hand over hand assist and Max A multimodal cues for swallow initiation during trials of Dys. 1 textures with nectar-thick liquids via cup. Patient demonstrated oral holding with all PO intake, however, did not demonstrate any overt s/s of aspiration. Patient's daughter reported increased intake with meals and no s/s of aspiration since diet downgrade on 09/26/14. Continue with current plan of care. Anticipate patient will require f/u SLP services at discharge.    HPI HPI: Patient was admitted with sepsis after found in vomit at home by daughter on 09/19/14. Patient with PMH of dementia, HTN, DVT of PE, subarachnoid hemorrhage, and asthma. Chest X-ray on 09/25/14 shows right pleural effusion and associated airspace consolidation; atelectasis versus pneumonia. History obtained from daughter at bedside who states patient normally able to walk a little with assistance and does speak occasionally. Per MD report, patient did not respond verbally and did not follow commands. Patient's daughter also reports that patient has good tolerance of Dys. 1 textures with thin liquids prior to admission, however, intake has decreased the past few days due to congestion and intake remains poor and coughing is a concern. Patient on 2L oxygen per nasal cannula. 09/26/14 BSE, SLP recommend Dys. 1 textures with nectar-thick liquids.   Pertinent Vitals Pain Assessment: No/denies pain  SLP Plan  Continue with current plan of care    Recommendations Diet recommendations: Dysphagia 1 (puree);Nectar-thick  liquid Liquids provided via: Cup Medication Administration: Crushed with puree Supervision: Staff to assist with self feeding;Full supervision/cueing for compensatory strategies;Trained caregiver to feed patient Compensations: Slow rate;Small sips/bites;Check for pocketing Postural Changes and/or Swallow Maneuvers: Seated upright 90 degrees              Oral Care Recommendations: Oral care BID Follow up Recommendations: Home health SLP;24 hour supervision/assistance Plan: Continue with current plan of care    GO     Cattaleya Wien 09/28/2014, 10:08 AM

## 2014-09-28 NOTE — Progress Notes (Signed)
Progress Note [facility] Pall[facility] Medicine Team [facility]  Subjective:  -patient remains non-verbal, daughter at bedside  -continued conversation regarding GOC and options and natural trajectory and expectations with overall failure to thrive in ES-dementia disease  - MOST form completed today, copy in chart for scanning  -plan is home with continued PACE services when medically stable   Objective: No Known Allergies Scheduled Meds: . budesonide  0.5 mg Nebulization BID  . feeding supplement (ENSURE COMPLETE)  237 mL Oral BID BM  . heparin  5,000 Units Subcutaneous 3 times per day  . ketotifen  1 drop Both Eyes BID  . memantine  10 mg Oral BID  . montelukast  10 mg Oral QHS  . pantoprazole (PROTONIX) IV  40 mg Intravenous Q24H  . piperacillin-tazobactam (ZOSYN)  IV  2.25 g Intravenous Q8H   Continuous Infusions: . dextrose 1,000 mL (09/27/14 1544)   PRN Meds:.acetaminophen, acetaminophen, albuterol, fluticasone, food thickener, guaiFENesin-dextromethorphan, morphine injection  BP 125/53  Pulse 87  Temp(Src) 98.2 F (36.8 C) (Oral)  Resp 20  Ht 4\' 11"  (1.499 m)  Wt 66.044 kg (145 lb 9.6 oz)  BMI 29.39 kg/m2  SpO2 100%   PPS:30 % at best     Intake/Output Summary (Last 24 hours) at 09/28/14 1628 Last data filed at 09/28/14 1600  Gross per 24 hour  Intake   2120 ml  Output    250 ml  Net   1870 ml        Physical Exam:  General: NAD HEENT: moist buccal membranes, no exudate noted  Chest:   CTA,  decreased in bases CVS:   RRR Abdomen: soft NT +BS Ext: without edema Neuro: open eyes, unable to follow commands,  Labs: CBC    Component Value Date/Time   WBC 13.5* 09/28/2014 0535   RBC 3.74* 09/28/2014 0535   RBC 4.16 03/26/2008 0530   HGB 10.4* 09/28/2014 0535   HCT 33.0* 09/28/2014 0535   PLT 177 09/28/2014 0535   MCV 88.2 09/28/2014 0535   MCH 27.8 09/28/2014 0535   MCHC 31.5 09/28/2014 0535   RDW 15.5 09/28/2014 0535   LYMPHSABS 0.7  09/19/2014 1651   MONOABS 0.4 09/19/2014 1651   EOSABS 0.0 09/19/2014 1651   BASOSABS 0.0 09/19/2014 1651    BMET    Component Value Date/Time   NA 152* 09/28/2014 0535   K 3.4* 09/28/2014 0535   CL 114* 09/28/2014 0535   CO2 23 09/28/2014 0535   GLUCOSE 95 09/28/2014 0535   GLUCOSE 94 10/26/2006 1653   BUN 15 09/28/2014 0535   CREATININE 1.59* 09/28/2014 0535   CALCIUM 8.9 09/28/2014 0535   GFRNONAA 29* 09/28/2014 0535   GFRAA 34* 09/28/2014 0535    CMP     Component Value Date/Time   NA 152* 09/28/2014 0535   K 3.4* 09/28/2014 0535   CL 114* 09/28/2014 0535   CO2 23 09/28/2014 0535   GLUCOSE 95 09/28/2014 0535   GLUCOSE 94 10/26/2006 1653   BUN 15 09/28/2014 0535   CREATININE 1.59* 09/28/2014 0535   CALCIUM 8.9 09/28/2014 0535   PROT 5.9* 09/24/2014 0550   ALBUMIN 2.3* 09/24/2014 0550   AST 27 09/24/2014 0550   ALT 29 09/24/2014 0550   ALKPHOS 102 09/24/2014 0550   BILITOT 1.6* 09/24/2014 0550   GFRNONAA 29* 09/28/2014 0535   GFRAA 34* 09/28/2014 0535      Patient Documents Completed or Given: Document Given Completed  Advanced Directives Pkt  MOST X   DNR    Gone from My Sight    Hard Choices X     Time In Time Out Total Time Spent with Patient Total Overall Time  0800 0825 25 min 25 min    Greater than 50%  of this time was spent counseling and coordinating care related to the above assessment and plan.  Lorinda CreedMary Larach NP  Palliative Medicine Team Team Phone # [phone] Pager [phone]  PMT will sign off at this time  1

## 2014-09-28 NOTE — Progress Notes (Signed)
Pt seen and examined with Dr. Beckie Saltsivet. Please refer to resident note for details.   Pt had 1 episode of fever overnight but none today so far. Tolerating PO diet.   Exam:  Gen: NAD, awake, alert but non verbal  CV: RRR, normal heart sounds  Pulm: CTA but difficult to assess posteriorly  Abd: soft, non tender, BS +  Ext: No pedal edema   Assessment and Plan:  80 y/o woman p/w AMS found to be in septic shock possibly secondary to aspiration PNA/UTI  Septic shock:  - Pt with likely aspiration PNA  - Tolerating dysphagia 1 diet  - Repeat blood cx - NGTD.  - c/w zosyn for now. D/c zosyn in AM and start PO levaquin to complete 10 day course  - Leukocytosis resolving. Will monitor  AMS:  - likely secondary to septic shock now improving  - CT head with no acute path  - No further w/u for now   AKI:  - Cr improving with IVF and PO intake  - c/w D5W for now given hypernatremia -  Would increase rate to 75 cc/hr  Asthma:  - c/w nebs, pulmicort and singulair   Thrombocytopenia:  - Resolved. Likely secondary to sepsis  - HIT panel negative.  Likley d/c home to PACE in AM

## 2014-09-28 NOTE — Progress Notes (Signed)
The skilled treatment note has been reviewed and SLP is in agreement. Pearce Littlefield, M.A., CCC-SLP [phone]  

## 2014-09-28 NOTE — Progress Notes (Signed)
Subjective: Overnight, patient spiked a 101.2 fever. She is afebrile this morning. Patient awake and alert this morning. Per daughter she is eating well. WBC count trending down to 13 today. Will likely go home to [facility] tomorrow.  Objective: Vital signs in last 24 hours: Filed Vitals:   09/27/14 1940 09/27/14 2022 09/27/14 2135 09/28/14 0540  BP:  142/45 139/53 123/54  Pulse:  96 101 92  Temp:  97.3 F (36.3 C) 101.2 F (38.4 C) 98.7 F (37.1 C)  TempSrc:  Oral Oral Oral  Resp:  21 20 20   Height:      Weight:      SpO2: 97% 96% 100% 98%   Weight change:   Intake/Output Summary (Last 24 hours) at 09/28/14 1028 Last data filed at 09/28/14 0900  Gross per 24 hour  Intake   1580 ml  Output    250 ml  Net   1330 ml   Physical Exam General: alert, awake, nonverbal, sitting up in med, NAD HEENT: [place]/AT, EOMI CV: RRR, normal S1/S2, no m/g/r Pulm: CTA bilaterally from anterior, unable to assess posterior breath sounds Abd: BS+, soft, non-tender Ext: warm, trace edema Neuro: alert, awake, still nonverbal, facial symmetry  Lab Results: Basic Metabolic Panel:  Recent Labs Lab 09/27/14 0830 09/28/14 0535  NA 150* 152*  K 3.5* 3.4*  CL 115* 114*  CO2 23 23  GLUCOSE 125* 95  BUN 18 15  CREATININE 1.75* 1.59*  CALCIUM 9.3 8.9   Liver Function Tests:  Recent Labs Lab 09/22/14 0445 09/24/14 0550  AST 45* 27  ALT 38* 29  ALKPHOS 274* 102  BILITOT 1.5* 1.6*  PROT 5.3* 5.9*  ALBUMIN 2.3* 2.3*   No results found for this basename: LIPASE, AMYLASE,  in the last 168 hours No results found for this basename: AMMONIA,  in the last 168 hours CBC:  Recent Labs Lab 09/27/14 0830 09/28/14 0535  WBC 16.4* 13.5*  HGB 10.8* 10.4*  HCT 32.8* 33.0*  MCV 87.0 88.2  PLT 135* 177   Cardiac Enzymes: No results found for this basename: CKTOTAL, CKMB, CKMBINDEX, TROPONINI,  in the last 168 hours BNP: No results found for this basename: PROBNP,  in the last 168  hours D-Dimer: No results found for this basename: DDIMER,  in the last 168 hours CBG:  Recent Labs Lab 09/23/14 0754 09/24/14 0756 09/25/14 0802 09/26/14 0758 09/27/14 0802 09/28/14 0801  GLUCAP 133* 111* 112* 121* 122* 90   Hemoglobin A1C: No results found for this basename: HGBA1C,  in the last 168 hours Fasting Lipid Panel: No results found for this basename: CHOL, HDL, LDLCALC, TRIG, CHOLHDL, LDLDIRECT,  in the last 168 hours Thyroid Function Tests: No results found for this basename: TSH, T4TOTAL, FREET4, T3FREE, THYROIDAB,  in the last 168 hours Coagulation: No results found for this basename: LABPROT, INR,  in the last 168 hours Anemia Panel: No results found for this basename: VITAMINB12, FOLATE, FERRITIN, TIBC, IRON, RETICCTPCT,  in the last 168 hours Urine Drug Screen: Drugs of Abuse     Component Value Date/Time   LABOPIA NONE DETECTED 09/19/2014 1705   COCAINSCRNUR NONE DETECTED 09/19/2014 1705   LABBENZ NONE DETECTED 09/19/2014 1705   AMPHETMU NONE DETECTED 09/19/2014 1705   THCU NONE DETECTED 09/19/2014 1705   LABBARB NONE DETECTED 09/19/2014 1705    Alcohol Level: No results found for this basename: ETH,  in the last 168 hours Urinalysis: No results found for this basename: COLORURINE, APPERANCEUR, LABSPEC, PHURINE,  GLUCOSEU, HGBUR, BILIRUBINUR, KETONESUR, PROTEINUR, UROBILINOGEN, NITRITE, LEUKOCYTESUR,  in the last 168 hours   Micro Results: Recent Results (from the past 240 hour(s))  CULTURE, BLOOD (ROUTINE X 2)     Status: None   Collection Time    09/19/14  4:51 PM      Result Value Ref Range Status   Specimen Description BLOOD RIGHT FOREARM   Final   Special Requests BOTTLES DRAWN AEROBIC AND ANAEROBIC 5CC   Final   Culture  Setup Time     Final   Value: 09/19/2014 22:20     Performed at [facility]   Culture     Final   Value: NO GROWTH 5 DAYS     Performed at [facility]   Report Status 09/25/2014 FINAL   Final  URINE  CULTURE     Status: None   Collection Time    09/19/14  5:05 PM      Result Value Ref Range Status   Specimen Description URINE, CLEAN CATCH   Final   Special Requests NONE   Final   Culture  Setup Time     Final   Value: 09/19/2014 22:28     Performed at [facility]   Colony Count     Final   Value: >=100,000 COLONIES/ML     Performed at [facility]   Culture     Final   Value: CITROBACTER KOSERI     Performed at [facility]   Report Status 09/22/2014 FINAL   Final   Organism ID, Bacteria CITROBACTER KOSERI   Final  CULTURE, BLOOD (ROUTINE X 2)     Status: None   Collection Time    09/23/14 10:05 PM      Result Value Ref Range Status   Specimen Description BLOOD LEFT HAND   Final   Special Requests BOTTLES DRAWN AEROBIC AND ANAEROBIC 10CC EA   Final   Culture  Setup Time     Final   Value: 09/24/2014 03:26     Performed at [facility]   Culture     Final   Value:        BLOOD CULTURE RECEIVED NO GROWTH TO DATE CULTURE WILL BE HELD FOR 5 DAYS BEFORE ISSUING A FINAL NEGATIVE REPORT     Performed at [facility]   Report Status PENDING   Incomplete  CULTURE, BLOOD (ROUTINE X 2)     Status: None   Collection Time    09/23/14 10:05 PM      Result Value Ref Range Status   Specimen Description BLOOD RIGHT ANTECUBITAL   Final   Special Requests BOTTLES DRAWN AEROBIC ONLY 5CC   Final   Culture  Setup Time     Final   Value: 09/24/2014 03:26     Performed at [facility]   Culture     Final   Value:        BLOOD CULTURE RECEIVED NO GROWTH TO DATE CULTURE WILL BE HELD FOR 5 DAYS BEFORE ISSUING A FINAL NEGATIVE REPORT     Performed at [facility]   Report Status PENDING   Incomplete  CULTURE, BLOOD (ROUTINE X 2)     Status: None   Collection Time    09/25/14 12:00 AM      Result Value Ref Range Status   Specimen Description BLOOD LEFT HAND   Final   Special Requests BOTTLES DRAWN AEROBIC ONLY 10CC  Final   Culture   Setup Time     Final   Value: 09/25/2014 09:03     Performed at [facility]   Culture     Final   Value:        BLOOD CULTURE RECEIVED NO GROWTH TO DATE CULTURE WILL BE HELD FOR 5 DAYS BEFORE ISSUING A FINAL NEGATIVE REPORT     Performed at [facility]   Report Status PENDING   Incomplete  CULTURE, BLOOD (ROUTINE X 2)     Status: None   Collection Time    09/25/14 12:10 AM      Result Value Ref Range Status   Specimen Description BLOOD RIGHT HAND   Final   Special Requests BOTTLES DRAWN AEROBIC AND ANAEROBIC 10CC EACH   Final   Culture  Setup Time     Final   Value: 09/25/2014 09:04     Performed at [facility]   Culture     Final   Value:        BLOOD CULTURE RECEIVED NO GROWTH TO DATE CULTURE WILL BE HELD FOR 5 DAYS BEFORE ISSUING A FINAL NEGATIVE REPORT     Performed at [facility]   Report Status PENDING   Incomplete   Studies/Results: No results found. Medications: I have reviewed the patient's current medications. Scheduled Meds: . budesonide  0.5 mg Nebulization BID  . feeding supplement (ENSURE COMPLETE)  237 mL Oral BID BM  . heparin  5,000 Units Subcutaneous 3 times per day  . ketotifen  1 drop Both Eyes BID  . memantine  10 mg Oral BID  . montelukast  10 mg Oral QHS  . pantoprazole (PROTONIX) IV  40 mg Intravenous Q24H  . piperacillin-tazobactam (ZOSYN)  IV  2.25 g Intravenous Q8H  . potassium chloride  40 mEq Oral Once   Continuous Infusions: . dextrose 1,000 mL (09/27/14 1544)   PRN Meds:.acetaminophen, acetaminophen, albuterol, fluticasone, food thickener, guaiFENesin-dextromethorphan, morphine injection Assessment/Plan: Ms. Kristen DunksBarber is a 80yo woman w/ PMHx of dementia and asthma who presented to the ED with altered mental status and septic shock likely 2/2 UTI.   1. Septic Shock: Patient spiked fever of 101.2 overnight. WBC count trending down 16.8>16.4> 13.5. Patient is on Zosyn currently. Blood cultures NGTD. Septic shock  originally thought to be due to UTI, but now more likely aspiration pneumonia.  - Continue Zosyn (started 10/26), will transition to oral antibiotics tomorrow - Continue monitoring CBC  - SLP eval --> dys 1, thick nectar liquids  - PT eval --> home health PT, will need SNF if daughter cannot care for pt  - Chaplain and palliative care consulted and recommendations/interventions appreciated; patient is DNR   2. AMS- Improving: Likely 2/2 septic shock. The patient continues to improve; she was awake this morning and sitting up in her chair. She is eating well.  - Continue to monitor mental status   3. AKI: Cr 2.53 on admission, now trending down again to 1.75>1.59. Baseline 1.2. AKI most likely due to sepsis.  - Continue to monitor   4. Diastolic CHF: Patient has trace edema on exam but does not appear volume overloaded.  - Continue oxygen therapy   5. Asthma: Patient continues to have mild wheezing and crackles at right lung base on exam. She is on Albuterol nebs Q6H PRN wheezing and Pulmicort nebs BID. She is breathing comfortably on 2 L oxygen via [place] and saturating at 96-100%. Good air movement on exam.  -  Continue Albuterol and Pulmicort nebs  - Oxygen therapy as needed  - Continue home robitussin for cough/congestion   6. Hypokalemia: K 3.4 today. Continue to monitor.  - Repleted potassium   7. Hypernatremia: Na stable between 149-152. Patient on D50 fluids.  - Continue to monitor   8. Thrombocytopenia- Resolving: Platelets 92 at admission five days ago, trended down to 40>57 and now trending back up to 94>135>177 today. 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 Heparin d/c'd on 10/24. HIT panel negative. Resumed on heparin on 10/28.  - Continue SQ Heparin  - Continue to monitor plt count   Diet: DYS 1, thick nectar liquids  DVT/PE PPx: Heparin SQ  Dispo: Disposition is deferred at this time, awaiting improvement of current medical problems. Likely [facility] tomorrow to [facility].    The patient does have a current PCP Jethro Bastos, MD) and does need an [facility] hospital follow-up appointment after discharge.  The patient does not have transportation limitations that hinder transportation to clinic appointments.  .Services Needed at time of discharge: Y = Yes, Blank = No PT:   OT:   RN:   Equipment:   Other:     LOS: 9 days   Rich Number, MD 09/28/2014, 10:28 AM

## 2014-09-28 NOTE — Progress Notes (Signed)
Physical Therapy Treatment Patient Details Name: [name] MRN: [id number] DOB: 10/13/1929 Today's Date: 09/28/2014    History of Present Illness  Patient was admitted with sepsis after found in vomit at home by daughter on 09/19/14. Patient with PMH of dementia, HTN, DVT of PE, subarachnoid hemorrhage, and asthma. Chest X-ray on 09/25/14 shows right pleural effusion and associated airspace consolidation; atelectasis versus pneumonia. History obtained from daughter at bedside who states patient normally able to walk a little with assistance and does speak occasionally. Per MD report, patient did not respond verbally and did not follow commands. Patient's daughter also reports that patient has good tolerance of Dys. 1 textures with thin liquids prior to admission, however, intake has decreased the past few days due to congestion and intake remains poor and coughing is a concern. Patient on 2L oxygen per nasal cannula. 09/26/14 BSE, SLP recommend Dys. 1 textures with nectar-thick liquids.     PT Comments    Pt currently with functional limitations due to septic shock and possible CVA. Patient has limited ROM, limited strength, increased tone in Left UE/LE, and total assist with transferring. Patient had heavy Right lean with sitting EOB and extensor tone in Left extremities that has not previously been documented. Patient unable to maintain up right trunk in sitting due to severe posterior lean. Patient will benefit from skilled pT to increase their independence and safety with mobility to allow discharge to home with 24 supervision by daughter and home health.    Follow Up Recommendations  Home health PT;Supervision/Assistance - 24 hour     Equipment Recommendations       Recommendations for Other Services       Precautions / Restrictions Precautions Precautions: Fall Restrictions Weight Bearing Restrictions: No    Mobility  Bed Mobility Overal bed mobility: Needs Assistance;+2  for physical assistance Bed Mobility: Supine to Sit     Supine to sit: Total assist;+2 for physical assistance     General bed mobility comments: Pt initiated minimal R leg movement to swing legs off bed during supine to sit  Transfers                    Ambulation/Gait                 Stairs            Wheelchair Mobility    Modified Rankin (Stroke Patients Only) Modified Rankin (Stroke Patients Only) Pre-Morbid Rankin Score: Moderately severe disability Modified Rankin: Severe disability     Balance Overall balance assessment: Needs assistance Sitting-balance support: Feet unsupported;No upper extremity supported Sitting balance-Leahy Scale: Zero Sitting balance - Comments: Pt required max assistance from PT to maintain sitting balance. Pt had heavy Right lean in sitting. Pt pushing posterior sitting and require PT to support trunk to maintain upright.                             Cognition Arousal/Alertness: Lethargic Behavior During Therapy: Flat affect Overall Cognitive Status: History of cognitive impairments - at baseline                      Exercises General Exercises - Upper Extremity Shoulder Flexion: PROM Shoulder ADduction: PROM Elbow Flexion: PROM Elbow Extension: PROM Wrist Flexion: PROM Wrist Extension: PROM General Exercises - Lower Extremity Ankle Circles/Pumps: PROM Heel Slides: PROM Other Exercises Other Exercises: Pt was able to intiate occasional ROM  exercises but most of exercise session was PROM. Pt has increased tone with ROM particuarly on Left UE and LE.     General Comments General comments (skin integrity, edema, etc.): Daughter reports that patient has been moving in the bed and verbally communicating with her. Pt did not verbally communicate during PT session.       Pertinent Vitals/Pain Pain Assessment: No/denies pain Pt's oxygen level dropped below 90% on RA. Pt was put back on 2L O2 and  SaO2 returned to 90-92% for remainder of treatment.     Home Living                      Prior Function            PT Goals (current goals can now be found in the care plan section) Acute Rehab PT Goals Patient Stated Goal: None stated    Frequency  Min 3X/week    PT Plan Current plan remains appropriate    Co-evaluation             End of Session Equipment Utilized During Treatment: Oxygen Activity Tolerance: Patient limited by fatigue;Other (comment) (Limited by cognition) Patient left: in bed;with call bell/phone within reach;with family/visitor present     Time: 1317-1400 PT Time Calculation (min): 43 min  Charges:                       G CodesYork Spaniel:      Hebert, Heike Pounds, SPT 09/28/2014, 2:27 PM

## 2014-09-29 DIAGNOSIS — E871 Hypo-osmolality and hyponatremia: Secondary | ICD-10-CM

## 2014-09-29 LAB — CBC
HEMATOCRIT: 30.6 % — AB (ref 36.0–46.0)
HEMOGLOBIN: 9.5 g/dL — AB (ref 12.0–15.0)
MCH: 28.3 pg (ref 26.0–34.0)
MCHC: 31 g/dL (ref 30.0–36.0)
MCV: 91.1 fL (ref 78.0–100.0)
Platelets: 226 10*3/uL (ref 150–400)
RBC: 3.36 MIL/uL — ABNORMAL LOW (ref 3.87–5.11)
RDW: 15.4 % (ref 11.5–15.5)
WBC: 10.8 10*3/uL — ABNORMAL HIGH (ref 4.0–10.5)

## 2014-09-29 LAB — BASIC METABOLIC PANEL
Anion gap: 12 (ref 5–15)
BUN: 13 mg/dL (ref 6–23)
CHLORIDE: 114 meq/L — AB (ref 96–112)
CO2: 23 mEq/L (ref 19–32)
Calcium: 8.9 mg/dL (ref 8.4–10.5)
Creatinine, Ser: 1.36 mg/dL — ABNORMAL HIGH (ref 0.50–1.10)
GFR calc Af Amer: 40 mL/min — ABNORMAL LOW (ref >90)
GFR calc non Af Amer: 35 mL/min — ABNORMAL LOW (ref >90)
Glucose, Bld: 90 mg/dL (ref 70–99)
POTASSIUM: 3.6 meq/L — AB (ref 3.7–5.3)
Sodium: 149 mEq/L — ABNORMAL HIGH (ref 137–147)

## 2014-09-29 LAB — GLUCOSE, CAPILLARY: Glucose-Capillary: 97 mg/dL (ref 70–99)

## 2014-09-29 MED ORDER — MOXIFLOXACIN HCL 400 MG PO TABS: 400.0000 mg | ORAL_TABLET | Freq: Every day | ORAL | Status: DC

## 2014-09-29 MED ORDER — LEVOFLOXACIN 500 MG PO TABS: 500.0000 mg | ORAL_TABLET | Freq: Every day | ORAL | Status: DC

## 2014-09-29 MED ORDER — ENSURE COMPLETE PO LIQD: 237.0000 mL | Freq: Two times a day (BID) | ORAL | Status: AC

## 2014-09-29 MED ORDER — LEVOFLOXACIN 750 MG PO TABS: 750.0000 mg | ORAL_TABLET | Freq: Once | ORAL | Status: DC

## 2014-09-29 MED ORDER — POTASSIUM CHLORIDE 20 MEQ/15ML (10%) PO LIQD
40.0000 meq | Freq: Once | ORAL | Status: DC
  Filled 2014-09-29: qty 30

## 2014-09-29 MED ORDER — LEVOFLOXACIN 750 MG PO TABS
750.0000 mg | ORAL_TABLET | Freq: Once | ORAL | Status: DC
  Filled 2014-09-29: qty 1

## 2014-09-29 MED ORDER — LEVOFLOXACIN 500 MG PO TABS: 500.0000 mg | ORAL_TABLET | ORAL | Status: DC

## 2014-09-29 NOTE — Discharge Instructions (Addendum)
Please complete moxifloxacin 400mg  daily for the next five days to finish treating your pneumonia.  Please follow-up with [facility] and Dr. Dorothe PeaKoehler for lab work.

## 2014-09-29 NOTE — Progress Notes (Signed)
Pt DC home by [facility], son at bedside, daughter at home waiting on pt. To arrive..Marland Kitchen

## 2014-09-29 NOTE — Clinical Social Work Note (Signed)
Clinical Social Worker facilitated patient discharge including contacting patient family to confirm patient discharge plans. CSW arranged ambulance transport via [facility] to patient's home address.  RN notified.  Clinical Social Worker will sign off for now as social work intervention is no longer needed. Please consult us again if new need arises.  [name], MSW, LCSWA [phone] 09/29/2014 2:59 PM

## 2014-09-29 NOTE — Progress Notes (Signed)
Pt seen and examined with Dr. Beckie Saltsivet. Please refer to resident note for details.   Pt is doing well today. No fevers overnight. At baseline mental status per son at bedside   Exam:  Gen: NAD, awake, alert but non verbal  CV: RRR, normal heart sounds  Pulm: CTA but difficult to assess posteriorly  Abd: soft, non tender, BS +  Ext: No pedal edema   Assessment and Plan:  80 y/o woman p/w AMS found to be in septic shock possibly secondary to aspiration PNA/UTI  Septic shock:  - Pt with likely aspiration PNA  - Tolerating dysphagia 1 diet  - Repeat blood cx - NGTD.  - d/c zosyn. Start PO clindamycin to cover for aspiration PNA - Leukocytosis improving   AMS:  - likely secondary to septic shock now improving  - CT head with no acute path  - No further w/u for now   AKI:  - Cr improving now - Hypernatremia is resolving. No further w/u for now  Asthma:  - c/w nebs, pulmicort and singulair   Thrombocytopenia:  - Resolved. Likely secondary to sepsis  - HIT panel negative.   Pt stable for d/c home today

## 2014-09-29 NOTE — Discharge Summary (Signed)
Name: [name] MRN: [id number] DOB: Oct 07, 1929 80 y.o. PCP: Jethro Bastos, MD  Date of Admission: 09/19/2014  4:05 PM Date of Discharge: 09/29/2014 Attending Physician: Earl Lagos, MD  Discharge Diagnosis: 1.  Principal Problem:   Septic shock Active Problems:   Asthma   Altered mental status   AKI (acute kidney injury)   Chronic diastolic heart failure   DNR (do not resuscitate)   Palliative care encounter   Weakness generalized   Thrombocytopenia  Discharge Medications:   Medication List         acetaminophen 500 MG tablet  Commonly known as:  TYLENOL  Take 1,000 mg by mouth 2 (two) times daily.     azelastine 0.05 % ophthalmic solution  Commonly known as:  OPTIVAR  Place 1 drop into both eyes 2 (two) times daily.     budesonide 0.5 MG/2ML nebulizer solution  Commonly known as:  PULMICORT  Take 0.5 mg by nebulization 2 (two) times daily as needed. wheezing     calcium carbonate 500 MG chewable tablet  Commonly known as:  TUMS - dosed in mg elemental calcium  Chew 1 tablet by mouth daily.     feeding supplement (ENSURE COMPLETE) Liqd  Take 237 mLs by mouth 2 (two) times daily between meals.     fluticasone 50 MCG/ACT nasal spray  Commonly known as:  FLONASE  Place 2 sprays into the nose 2 (two) times daily as needed. For congestion and runny nose     levofloxacin 750 MG tablet  Commonly known as:  LEVAQUIN  Take 1 tablet (750 mg total) by mouth once.     magnesium hydroxide 400 MG/5ML suspension  Commonly known as:  MILK OF MAGNESIA  Take 15 mLs by mouth daily as needed for mild constipation.     memantine 10 MG tablet  Commonly known as:  NAMENDA  Take 10 mg by mouth 2 (two) times daily.     montelukast 10 MG tablet  Commonly known as:  SINGULAIR  Take 10 mg by mouth at bedtime.     multivitamins ther. w/minerals Tabs tablet  Take 1 tablet by mouth daily.     omeprazole 40 MG capsule  Commonly known as:  PRILOSEC  Take 40 mg  by mouth daily.     predniSONE 5 MG Tabs tablet  Commonly known as:  STERAPRED UNI-PAK  Take 5 mg by mouth daily.     PROVENTIL (2.5 MG/3ML) 0.083% nebulizer solution  Generic drug:  albuterol  Take 2.5 mg by nebulization every 6 (six) hours as needed. For shortness of breath     Q-TUSSIN DM 10-100 MG/5ML liquid  Generic drug:  Dextromethorphan-Guaifenesin  Take 10 mLs by mouth every 6 (six) hours as needed (for cough/ congestion).     THICK-IT Powd  Generic drug:  STARCH-MALTO DEXTRIN  Take 1 application by mouth daily as needed (nectar consistency).     vitamin C 500 MG tablet  Commonly known as:  ASCORBIC ACID  Take 500 mg by mouth as needed (for congestion).     Vitamin D3 2000 UNITS Tabs  Take 2,000 Units by mouth daily.        Disposition and follow-up:   [name] was discharged from [facility] in Good condition.  At the hospital follow up visit please address:  1.  AKI: Improved to 1.36 by discharge, baseline ~1.2. Will need repeat bmet.      Hypokalemia: Patient with hypokalemia in  3.0-3.5 range. Will need repeat bmet.       Hypernatremia: Na trended up and stayed between 149-152 even off of fluids. Na 149 on discharge. Needs to followed     up.   2.  Labs / imaging needed at time of follow-up: CBC, bmet  3.  Pending labs/ test needing follow-up: None  Follow-up Appointments:   Discharge Instructions: Discharge Instructions   Diet - low sodium heart healthy    Complete by:  As directed      Face-to-face encounter (required for Medicare/Medicaid patients)    Complete by:  As directed   I Rivet, Carly certify that this patient is under my care and that I, or a nurse practitioner or physician's assistant working with me, had a face-to-face encounter that meets the physician face-to-face encounter requirements with this patient on 09/29/2014. The encounter with the patient was in whole, or in part for the following medical condition(s)  which is the primary reason for home health care: Pneumonia, deconditioning  The encounter with the patient was in whole, or in part, for the following medical condition, which is the primary reason for home health care:  Pneumonia, deconditioning  I certify that, based on my findings, the following services are medically necessary home health services:   Physical therapy Speech language pathology    My clinical findings support the need for the above services:  Unable to leave home safely without assistance and/or assistive device  Further, I certify that my clinical findings support that this patient is homebound due to:  Unable to leave home safely without assistance  Reason for Medically Necessary Home Health Services:  Therapy- Home Adaptation to Facilitate Safety     Home Health    Complete by:  As directed   To provide the following care/treatments:   PT SLP       Increase activity slowly    Complete by:  As directed            Consultations: Treatment Team:  Palliative [facility]  Procedures Performed:  Dg Chest 1 View  09/25/2014   CLINICAL DATA:  Fever  EXAM: CHEST - 1 VIEW  COMPARISON:  09/22/2014  FINDINGS: Degraded by rotation. Right pleural effusion associated airspace consolidation. Left lung base opacity. Small left pleural effusion not excluded. Aortic tortuosity, scattered atherosclerosis, and likely dilatation. No definite pneumothorax. Diffuse osteopenia. High-riding left humeral head suggests underlying rotator cuff pathology. Surgical clips right upper quadrant.  IMPRESSION: Degraded by rotation. Right pleural effusion and associated airspace consolidation; atelectasis versus pneumonia.   Electronically Signed   By: Jearld LeschAndrew  DelGaizo M.D.   On: 09/25/2014 01:21   Dg Chest 2 View  09/22/2014   CLINICAL DATA:  Shortness of breath, very weak, personal history of asthma, hypertension, pulmonary embolism, asthmatic bronchitis  EXAM: CHEST  2 VIEW  COMPARISON:  09/19/2014   FINDINGS: Enlargement of cardiac silhouette.  Pulmonary vascular congestion.  Perihilar infiltrates favor mild pulmonary edema.  Small RIGHT pleural effusion minimal basilar atelectasis.  Slight rotation to the RIGHT.  No pneumothorax.  Healing versus old distal RIGHT clavicular fracture.  IMPRESSION: Probable mild CHF, little changed.   Electronically Signed   By: Ulyses SouthwardMark  Boles M.D.   On: 09/22/2014 07:58   Ct Head Wo Contrast  09/19/2014   CLINICAL DATA:  Altered mental status, hypotension  EXAM: CT HEAD WITHOUT CONTRAST  TECHNIQUE: Contiguous axial images were obtained from the base of the skull through the vertex without intravenous contrast.  COMPARISON:  11/03/2011  FINDINGS: There is mucosal thickening with air-fluid level bilateral maxillary sinus. Mucosal thickening stress that mild mucosal thickening bilateral ethmoid air cells. The mastoid air cells are unremarkable.  Atherosclerotic calcifications of carotid siphon.  No intracranial hemorrhage, mass effect or midline shift. Periventricular and patchy subcortical chronic white matter disease again noted. Stable cerebral atrophy. Ventricular size is stable from prior exam. No acute cortical infarction. No mass lesion is noted on this unenhanced scan.  IMPRESSION: No acute intracranial abnormality. Stable atrophy and chronic white matter disease. There is mucosal thickening with air-fluid level bilateral maxillary sinus.   Electronically Signed   By: Natasha Mead M.D.   On: 09/19/2014 18:18   Dg Chest Port 1 View  09/19/2014   CLINICAL DATA:  Mental status change. Now all unresponsive now on this morning and home vomiting.  EXAM: PORTABLE CHEST - 1 VIEW  COMPARISON:  Chest x-ray dated July 24, 2012  FINDINGS: The lungs are mildly hypoinflated. There is no focal infiltrate. The interstitial markings are increased. There is no pneumothorax or pleural effusion. The cardiac silhouette mildly enlarged though stable. There is stable tortuosity of the  ascending and descending thoracic aorta. The pulmonary vascularity is slightly less distinct today. There are degenerative changes of both shoulders. No acute rib fracture is observed today. There is stable pleural thickening adjacent to the lateral aspect of the left second rib.  IMPRESSION: The findings suggest low-grade CHF with mild interstitial edema. There is no focal pneumonia.   Electronically Signed   By: David  [place]   On: 09/19/2014 17:28   Admission HPI: [name] is an 80 year old woman with PMH of dementia, HTN, hx of DVT of PE, hx of subarachnoid hemorrhage, asthma, who presents with altered mental status. The patient is nonverbal and most of the information here comes from chart review and from family. Per [name], her daughter and HPOA, who was at her bedside, her mother was at her baseline, eating, conversant with a few words, able to transfer from bed to chair until the morning of her hospitalization. Around 5AM yesterday she noticed that her mother had night sweats and was "wet" all over. Her daughter started to change her mother's clothing but the patient started vomiting non bloody emesis and had one BM of soft stools. The patient had another episode of non bloody emesis and her daughter called Dr. Willeen Niece, her mother's PCP, and made an appointment for 1:30PM. At the PCP, the patient was found to have low blood pressure, and was sent to the ED. At the ED she found to be in septic shock with aspiration pneumonia as the likely source of sepsis. The critical care team was called and had extensive conversation with the family and HPOA in regards to goals of care. The family decided that they did no want aggressive measures, per the patient's wishes, and opted for comfort care with DNR/DNI orders but with IV antibiotic use. The patient had been admitted to the [facility] and the IMTS was called this morning to assume care of [name] since she is a [facility] patient.   Overnight [name] has remained afebrile but has had low blood pressure with SBP in the 90-60s range and, mild tachycardia, and oxygen requirement with 98% pulse oxymetry with 4L oxygen per nasal canula. She remains non verbal, unresponsive, but appears comfortable with no facial grimace and no increased work of breathing. Her daughter, [name], remains at her bedside.    Hospital  Course by problem list: Principal Problem:   Septic shock Active Problems:   Asthma   Altered mental status   AKI (acute kidney injury)   Chronic diastolic heart failure   DNR (do not resuscitate)   Palliative care encounter   Weakness generalized   Thrombocytopenia   1. Septic Shock likely 2/2 UTI and Aspiration Pneumonia-Resolving: Patient presented with leukocytosis of 18k, hypotension, lactic acid of 9.18, and increased Cr. Initial thought was that sepsis was due to aspiration pneumonia, but CXR did not show a consolidation. Her U/A was positive for large leukocytes and urine culture grew GNR. Her blood cultures showed no growth. Her WBC count increased to 36k. She was started on Vanc and Zosyn (10/21). Palliative care was consulted to establish goals of care. Patient continued to spike fevers of 101-102. Her urine culture grew Citrobacter. She was then placed on Ceftriaxone (10/23) and Vanc/Zosyn was discontinued. Her WBC continued to trend down from 36>30>27>13>9.5. Patient then spiked another fever of 102 on 10/26 and WBC count increased to 10.9. There was concern for possible aspiration pneumonia given crackles at right lung base so a CXR was ordered. CXR showed a right lung base consolidation consistent with pneumonia. Ceftriaxone was discontinued on 10/26 and she was placed back on Zosyn for broader coverage. She continued to spike fevers and WBC count trended up to 16.8. She was improving clinically and stopped spiking fevers. WBC count trended down to 13.5>10.8. Patient was transitioned to Levaquin 750 mg daily and prescribed  for a 10 day course. Patient was discharged home with her daughter and with [facility]. She should follow up with her PCP in 1 week.   2. AMS- Improving: On admission, patient was nonresponsive, did not follow commands, and did not open her eyes spontaneously. CT Head was negative for acute intracranial abnormalities. As her sepsis improved, her mental status improved dramatically. She opened her eyes on 10/24 and was tracking. She continued to be more alert and awake throughout the remainder of her hospitalization. At baseline, per family, she is not very verbal but she continued to be non-verbal during exam. She worked with PT and was able to sit up in a chair. She will need home health PT.   3. AKI- Resolving: Cr 2.5 on admission, baseline around 1.2. Her initial AKI likely due to sepsis and insensible losses due to fever/sweating. She was given a 2 L bolus of NS in the ED. She was continued on light IVF at 50 ml/hr given edema on exam. Her Cr continued to improve as her sepsis improved with Cr declining to 1.75>1.59>1.36. She will need a repeat bmet as outpatient.   4. Diastolic CHF: Initially thought to be in mild exacerbation on admission with edema in her lower extremities on exam and CXR showed some pulmonary edema. 2D echo in 2010 showed EF of 70%, mild diastolic dysfunction, aortic valve with mild calcification. Not on diuretics at home. Her edema continued to improve on exam. She was only placed on light IV fluids at 50 ml/hr to maintain her BP. She continued to not appear fluid overloaded on exam.   5. Asthma: Patient not on home oxygen, takes Pulmicort, albuterol nebs, and Singulair at home. Patient had mild wheezing on exam during admission, which continued to improve. She was given Albuterol nebs Q6H PRN wheezing and Pulmicort nebs BID. She was placed on oxygen via [place] and saturated in mid to high 90s. She eventually was taken off oxygen as she was breathing comfortably on  room air. She continued to  have good air movement on exam.   6. Hypokalemia: K fluctuated between 3.0-4.0. Her potassium was replaced with 40 mEq KCl when K below 4.0. She was asymptomatic. Her K will need to be checked at f/u visit with PCP.   7. Hypernatremia: Na 149 on 10/26, remained stable between 149-152. Patient transitioned to D50 fluids. Patient will need follow up bmet at outpatient f/u visit.   8. Thrombocytopenia- Resolved: Platelets were 92 on admission and then trended down to 40 five days later. Heparin SQ was stopped on 10/24 because of concern for HIT. 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 Patient was placed on SCDs. Her platelets continued to trend back up, 57>94>135>177>226. HIT panel was negative. Patient was placed back on heparin DVT prophylaxis on 10/28. Her thrombocytopenia was likely due to sepsis.   Discharge Vitals:   BP 109/48  Pulse 86  Temp(Src) 98.9 F (37.2 C) (Oral)  Resp 18  Ht 4\' 11"  (1.499 m)  Wt 145 lb 9.6 oz (66.044 kg)  BMI 29.39 kg/m2  SpO2 94% Physical Exam  General: alert, awake, nonverbal, sitting up in med, NAD  HEENT: /AT, EOMI  CV: RRR, normal S1/S2, no m/g/r  Pulm: CTA bilaterally from anterior, unable to assess posterior breath sounds  Abd: BS+, soft, non-tender  Ext: warm, trace edema  Neuro: alert, awake, still nonverbal, facial symmetry  Discharge Labs:  Results for orders placed during the hospital encounter of 09/19/14 (from the past 24 hour(s))  CBC     Status: Abnormal   Collection Time    09/29/14  4:26 AM      Result Value Ref Range   WBC 10.8 (*) 4.0 - 10.5 K/uL   RBC 3.36 (*) 3.87 - 5.11 MIL/uL   Hemoglobin 9.5 (*) 12.0 - 15.0 g/dL   HCT 40.9 (*) 81.1 - 91.4 %   MCV 91.1  78.0 - 100.0 fL   MCH 28.3  26.0 - 34.0 pg   MCHC 31.0  30.0 - 36.0 g/dL   RDW 78.2  95.6 - 21.3 %   Platelets 226  150 - 400 K/uL  BASIC METABOLIC PANEL     Status: Abnormal   Collection Time    09/29/14  4:26 AM      Result Value  Ref Range   Sodium 149 (*) 137 - 147 mEq/L   Potassium 3.6 (*) 3.7 - 5.3 mEq/L   Chloride 114 (*) 96 - 112 mEq/L   CO2 23  19 - 32 mEq/L   Glucose, Bld 90  70 - 99 mg/dL   BUN 13  6 - 23 mg/dL   Creatinine, Ser 0.86 (*) 0.50 - 1.10 mg/dL   Calcium 8.9  8.4 - 57.8 mg/dL   GFR calc non Af Amer 35 (*) >90 mL/min   GFR calc Af Amer 40 (*) >90 mL/min   Anion gap 12  5 - 15  GLUCOSE, CAPILLARY     Status: None   Collection Time    09/29/14  7:45 AM      Result Value Ref Range   Glucose-Capillary 97  70 - 99 mg/dL    Signed: Rich Number, MD 09/29/2014, 1:09 PM    Services Ordered on Discharge: Home health PT, SLP Equipment Ordered on Discharge: None

## 2014-09-29 NOTE — Progress Notes (Signed)
ANTIBIOTIC CONSULT NOTE - FOLLOW UP  Pharmacy Consult for Zosyn Indication: aspiration pneumonia and UTI coverage  No Known Allergies  Patient Measurements: Height: 4\' 11"  (149.9 cm) Weight: 145 lb 9.6 oz (66.044 kg) IBW/kg (Calculated) : 43.2 kg  Vital Signs: Temp: 98.9 F (37.2 C) (10/30 0614) Temp Source: Oral (10/30 25360614) BP: 109/48 mmHg (10/30 0614) Pulse Rate: 86 (10/30 0614) Intake/Output from previous day: 10/29 0701 - 10/30 0700 In: 720 [P.O.:170; I.V.:500; IV Piggyback:50] Out: 552 [Urine:550; Emesis/NG output:2] Intake/Output from this shift:    Labs:  Recent Labs  09/27/14 0830 09/28/14 0535 09/29/14 0426  WBC 16.4* 13.5* 10.8*  HGB 10.8* 10.4* 9.5*  PLT 135* 177 226  CREATININE 1.75* 1.59* 1.36*   Estimated Creatinine Clearance: 25.9 ml/min (by C-G formula based on Cr of 1.36).  Assessment:  Day #5 Zosyn.  CrCl now >20 ml/min  Repeat blood cultures from 10/26 - no growth to date.  Goal of Therapy:   appropriate Zosyn dose for renal function and infection  Plan:   Change Zosyn to 3.375 grams IV q8hrs (each over 4 hours)   Pharmacy will sign off.  Please advise if we can be of further assiatance  Yvette Roark, Haskel SchroederLora Poteet, PharmD 09/29/2014,10:35 AM

## 2014-09-29 NOTE — Discharge Summary (Signed)
INTERNAL MEDICINE ATTENDING DISCHARGE COSIGN   I evaluated the patient on the day of discharge and discussed the discharge plan with my resident team. I agree with the discharge documentation and disposition.  Pt was discharged on moxifloxacin instead of levofloxacin or clindamycin after extensive discussion with the residents and literature review  [name] 09/29/2014, 3:13 PM

## 2014-09-29 NOTE — Progress Notes (Signed)
Subjective: Patient continues to do well this morning. She was afebrile over last 24 hours. She is alert and awake, still nonverbal. Family reports she has been saying a few words to them, back to baseline. Will [facility] home today to [facility].  Objective: Vital signs in last 24 hours: Filed Vitals:   09/28/14 1804 09/28/14 2050 09/29/14 0614 09/29/14 0845  BP:  115/61 109/48   Pulse:  90 86   Temp:  98.7 F (37.1 C) 98.9 F (37.2 C)   TempSrc:  Oral Oral   Resp:  18 18   Height:      Weight:      SpO2: 93% 100% 100% 94%   Weight change:   Intake/Output Summary (Last 24 hours) at 09/29/14 1222 Last data filed at 09/29/14 0617  Gross per 24 hour  Intake    550 ml  Output    551 ml  Net     -1 ml   Physical Exam General: alert, awake, nonverbal, sitting up in med, NAD  HEENT: [place]/AT, EOMI  CV: RRR, normal S1/S2, no m/g/r  Pulm: CTA bilaterally from anterior, unable to assess posterior breath sounds  Abd: BS+, soft, non-tender  Ext: warm, trace edema  Neuro: alert, awake, still nonverbal, facial symmetry  Lab Results: Basic Metabolic Panel:  Recent Labs Lab 09/28/14 0535 09/29/14 0426  NA 152* 149*  K 3.4* 3.6*  CL 114* 114*  CO2 23 23  GLUCOSE 95 90  BUN 15 13  CREATININE 1.59* 1.36*  CALCIUM 8.9 8.9   Liver Function Tests:  Recent Labs Lab 09/24/14 0550  AST 27  ALT 29  ALKPHOS 102  BILITOT 1.6*  PROT 5.9*  ALBUMIN 2.3*   No results found for this basename: LIPASE, AMYLASE,  in the last 168 hours No results found for this basename: AMMONIA,  in the last 168 hours CBC:  Recent Labs Lab 09/28/14 0535 09/29/14 0426  WBC 13.5* 10.8*  HGB 10.4* 9.5*  HCT 33.0* 30.6*  MCV 88.2 91.1  PLT 177 226   Cardiac Enzymes: No results found for this basename: CKTOTAL, CKMB, CKMBINDEX, TROPONINI,  in the last 168 hours BNP: No results found for this basename: PROBNP,  in the last 168 hours D-Dimer: No results found for this basename: DDIMER,  in the  last 168 hours CBG:  Recent Labs Lab 09/24/14 0756 09/25/14 0802 09/26/14 0758 09/27/14 0802 09/28/14 0801 09/29/14 0745  GLUCAP 111* 112* 121* 122* 90 97   Hemoglobin A1C: No results found for this basename: HGBA1C,  in the last 168 hours Fasting Lipid Panel: No results found for this basename: CHOL, HDL, LDLCALC, TRIG, CHOLHDL, LDLDIRECT,  in the last 168 hours Thyroid Function Tests: No results found for this basename: TSH, T4TOTAL, FREET4, T3FREE, THYROIDAB,  in the last 168 hours Coagulation: No results found for this basename: LABPROT, INR,  in the last 168 hours Anemia Panel: No results found for this basename: VITAMINB12, FOLATE, FERRITIN, TIBC, IRON, RETICCTPCT,  in the last 168 hours Urine Drug Screen: Drugs of Abuse     Component Value Date/Time   LABOPIA NONE DETECTED 09/19/2014 1705   COCAINSCRNUR NONE DETECTED 09/19/2014 1705   LABBENZ NONE DETECTED 09/19/2014 1705   AMPHETMU NONE DETECTED 09/19/2014 1705   THCU NONE DETECTED 09/19/2014 1705   LABBARB NONE DETECTED 09/19/2014 1705    Alcohol Level: No results found for this basename: ETH,  in the last 168 hours Urinalysis: No results found for this basename: COLORURINE, APPERANCEUR,  LABSPEC, PHURINE, GLUCOSEU, HGBUR, BILIRUBINUR, KETONESUR, PROTEINUR, UROBILINOGEN, NITRITE, LEUKOCYTESUR,  in the last 168 hours   Micro Results: Recent Results (from the past 240 hour(s))  CULTURE, BLOOD (ROUTINE X 2)     Status: None   Collection Time    09/19/14  4:51 PM      Result Value Ref Range Status   Specimen Description BLOOD RIGHT FOREARM   Final   Special Requests BOTTLES DRAWN AEROBIC AND ANAEROBIC 5CC   Final   Culture  Setup Time     Final   Value: 09/19/2014 22:20     Performed at [facility]   Culture     Final   Value: NO GROWTH 5 DAYS     Performed at [facility]   Report Status 09/25/2014 FINAL   Final  URINE CULTURE     Status: None   Collection Time    09/19/14  5:05 PM       Result Value Ref Range Status   Specimen Description URINE, CLEAN CATCH   Final   Special Requests NONE   Final   Culture  Setup Time     Final   Value: 09/19/2014 22:28     Performed at [facility]   Colony Count     Final   Value: >=100,000 COLONIES/ML     Performed at [facility]   Culture     Final   Value: CITROBACTER KOSERI     Performed at [facility]   Report Status 09/22/2014 FINAL   Final   Organism ID, Bacteria CITROBACTER KOSERI   Final  CULTURE, BLOOD (ROUTINE X 2)     Status: None   Collection Time    09/23/14 10:05 PM      Result Value Ref Range Status   Specimen Description BLOOD LEFT HAND   Final   Special Requests BOTTLES DRAWN AEROBIC AND ANAEROBIC 10CC EA   Final   Culture  Setup Time     Final   Value: 09/24/2014 03:26     Performed at [facility]   Culture     Final   Value:        BLOOD CULTURE RECEIVED NO GROWTH TO DATE CULTURE WILL BE HELD FOR 5 DAYS BEFORE ISSUING A FINAL NEGATIVE REPORT     Performed at [facility]   Report Status PENDING   Incomplete  CULTURE, BLOOD (ROUTINE X 2)     Status: None   Collection Time    09/23/14 10:05 PM      Result Value Ref Range Status   Specimen Description BLOOD RIGHT ANTECUBITAL   Final   Special Requests BOTTLES DRAWN AEROBIC ONLY 5CC   Final   Culture  Setup Time     Final   Value: 09/24/2014 03:26     Performed at [facility]   Culture     Final   Value:        BLOOD CULTURE RECEIVED NO GROWTH TO DATE CULTURE WILL BE HELD FOR 5 DAYS BEFORE ISSUING A FINAL NEGATIVE REPORT     Performed at [facility]   Report Status PENDING   Incomplete  CULTURE, BLOOD (ROUTINE X 2)     Status: None   Collection Time    09/25/14 12:00 AM      Result Value Ref Range Status   Specimen Description BLOOD LEFT HAND   Final   Special Requests BOTTLES DRAWN AEROBIC ONLY 10CC  Final   Culture  Setup Time     Final   Value: 09/25/2014 09:03     Performed at [facility]   Culture     Final   Value:        BLOOD CULTURE RECEIVED NO GROWTH TO DATE CULTURE WILL BE HELD FOR 5 DAYS BEFORE ISSUING A FINAL NEGATIVE REPORT     Performed at [facility]   Report Status PENDING   Incomplete  CULTURE, BLOOD (ROUTINE X 2)     Status: None   Collection Time    09/25/14 12:10 AM      Result Value Ref Range Status   Specimen Description BLOOD RIGHT HAND   Final   Special Requests BOTTLES DRAWN AEROBIC AND ANAEROBIC 10CC EACH   Final   Culture  Setup Time     Final   Value: 09/25/2014 09:04     Performed at [facility]   Culture     Final   Value:        BLOOD CULTURE RECEIVED NO GROWTH TO DATE CULTURE WILL BE HELD FOR 5 DAYS BEFORE ISSUING A FINAL NEGATIVE REPORT     Performed at [facility]   Report Status PENDING   Incomplete   Studies/Results: No results found. Medications: I have reviewed the patient's current medications. Scheduled Meds: . budesonide  0.5 mg Nebulization BID  . feeding supplement (ENSURE COMPLETE)  237 mL Oral BID BM  . heparin  5,000 Units Subcutaneous 3 times per day  . ketotifen  1 drop Both Eyes BID  . memantine  10 mg Oral BID  . montelukast  10 mg Oral QHS  . pantoprazole (PROTONIX) IV  40 mg Intravenous Q24H  . piperacillin-tazobactam (ZOSYN)  IV  2.25 g Intravenous Q8H   Continuous Infusions: . dextrose 50 mL/hr at 09/29/14 0218   PRN Meds:.acetaminophen, acetaminophen, albuterol, fluticasone, food thickener, guaiFENesin-dextromethorphan, morphine injection Assessment/Plan: Ms. Torrens is a 80yo woman w/ PMHx of dementia and asthma who presented to the ED with altered mental status and septic shock likely 2/2 UTI.   1. Septic Shock 2/2 UTI and Aspiration PNA- Resolving: Patient afebrile over last 24 hours. WBC count trending down to 13.5>10.8 today. Patient is on Zosyn currently, will transition to PO Levaquin today and complete a 10 day course. Blood cultures NGTD. Septic shock initially  likely to UTI and then aspiration pneumonia. Patient is no longer in shock as her vital signs have been stable.  - Transition to Levaquin today for 10 day course - Continue monitoring CBC  - SLP eval --> dys 1, thick nectar liquids  - PT eval --> home health PT   2. AMS- Improving: Likely 2/2 septic shock. The patient continues to improve; she was awake this morning and sitting up in her chair. She is eating well.  - Continue to monitor mental status   3. AKI: Cr 2.53 on admission, now trending down again to 1.75>1.59>1.36. Baseline 1.2. AKI most likely due to sepsis.  - Continue to monitor   4. Diastolic CHF: Patient has trace edema on exam but does not appear volume overloaded.  - Continue oxygen therapy   5. Asthma: Patient continues to have mild wheezing and crackles at right lung base on exam. She is on Albuterol nebs Q6H PRN wheezing and Pulmicort nebs BID. She is breathing comfortably on 2 L oxygen via [place] and saturating at 96-100%. Good air movement on exam.  - Continue Albuterol and  Pulmicort nebs  - Oxygen therapy as needed  - Continue home robitussin for cough/congestion   6. Hypokalemia: K 3.6 today. Continue to monitor.  - Repleted potassium   7. Hypernatremia: Na stable between 149-152. Patient on D50 fluids.  - Continue to monitor   8. Thrombocytopenia- Resolving: Platelets 92 at admission five days ago, trended down to 40>57 and now trending back up to [phone] today. 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 Heparin d/c'd on 10/24. HIT panel negative. Resumed on heparin on 10/28.  - Continue SQ Heparin  - Continue to monitor plt count   Diet: DYS 1, thick nectar liquids  DVT/PE PPx: Heparin SQ  Dispo: D/c to [facility] today  The patient does have a current PCP Jethro Bastos, MD) and does need an [facility] hospital follow-up appointment after discharge.  The patient does not have transportation limitations that hinder transportation to clinic  appointments.  .Services Needed at time of discharge: Y = Yes, Blank = No PT:   OT:   RN:   Equipment:   Other:     LOS: 10 days   Rich Number, MD 09/29/2014, 12:22 PM

## 2014-09-30 LAB — CULTURE, BLOOD (ROUTINE X 2)
CULTURE: NO GROWTH
Culture: NO GROWTH

## 2014-10-01 LAB — CULTURE, BLOOD (ROUTINE X 2)
CULTURE: NO GROWTH
Culture: NO GROWTH

## 2014-12-18 ENCOUNTER — Inpatient Hospital Stay (HOSPITAL_COMMUNITY)
Admission: EM | Admit: 2014-12-18 | Discharge: 2014-12-24 | DRG: 871 | Disposition: A | Discharge status: Home Health | Payer: Medicare (Managed Care) | Attending: Internal Medicine | Admitting: Internal Medicine

## 2014-12-18 ENCOUNTER — Emergency Department (HOSPITAL_COMMUNITY): Payer: Medicare (Managed Care)

## 2014-12-18 ENCOUNTER — Encounter (HOSPITAL_COMMUNITY): Payer: Self-pay | Admitting: Emergency Medicine

## 2014-12-18 DIAGNOSIS — Z86718 Personal history of other venous thrombosis and embolism: Secondary | ICD-10-CM

## 2014-12-18 DIAGNOSIS — Z86711 Personal history of pulmonary embolism: Secondary | ICD-10-CM | POA: Diagnosis not present

## 2014-12-18 DIAGNOSIS — K219 Gastro-esophageal reflux disease without esophagitis: Secondary | ICD-10-CM | POA: Diagnosis present

## 2014-12-18 DIAGNOSIS — R627 Adult failure to thrive: Secondary | ICD-10-CM | POA: Diagnosis present

## 2014-12-18 DIAGNOSIS — Z8249 Family history of ischemic heart disease and other diseases of the circulatory system: Secondary | ICD-10-CM

## 2014-12-18 DIAGNOSIS — Z7952 Long term (current) use of systemic steroids: Secondary | ICD-10-CM

## 2014-12-18 DIAGNOSIS — E870 Hyperosmolality and hypernatremia: Secondary | ICD-10-CM | POA: Diagnosis present

## 2014-12-18 DIAGNOSIS — I10 Essential (primary) hypertension: Secondary | ICD-10-CM | POA: Diagnosis present

## 2014-12-18 DIAGNOSIS — J189 Pneumonia, unspecified organism: Secondary | ICD-10-CM | POA: Diagnosis present

## 2014-12-18 DIAGNOSIS — E872 Acidosis, unspecified: Secondary | ICD-10-CM

## 2014-12-18 DIAGNOSIS — R4182 Altered mental status, unspecified: Secondary | ICD-10-CM

## 2014-12-18 DIAGNOSIS — N390 Urinary tract infection, site not specified: Secondary | ICD-10-CM

## 2014-12-18 DIAGNOSIS — F0390 Unspecified dementia without behavioral disturbance: Secondary | ICD-10-CM | POA: Diagnosis present

## 2014-12-18 DIAGNOSIS — N179 Acute kidney failure, unspecified: Secondary | ICD-10-CM | POA: Diagnosis present

## 2014-12-18 DIAGNOSIS — E876 Hypokalemia: Secondary | ICD-10-CM | POA: Diagnosis present

## 2014-12-18 DIAGNOSIS — J690 Pneumonitis due to inhalation of food and vomit: Secondary | ICD-10-CM | POA: Diagnosis present

## 2014-12-18 DIAGNOSIS — I4891 Unspecified atrial fibrillation: Secondary | ICD-10-CM | POA: Diagnosis present

## 2014-12-18 DIAGNOSIS — Y95 Nosocomial condition: Secondary | ICD-10-CM | POA: Diagnosis present

## 2014-12-18 DIAGNOSIS — A419 Sepsis, unspecified organism: Secondary | ICD-10-CM | POA: Diagnosis present

## 2014-12-18 DIAGNOSIS — R6521 Severe sepsis with septic shock: Secondary | ICD-10-CM | POA: Diagnosis present

## 2014-12-18 DIAGNOSIS — Z87440 Personal history of urinary (tract) infections: Secondary | ICD-10-CM | POA: Diagnosis not present

## 2014-12-18 DIAGNOSIS — E162 Hypoglycemia, unspecified: Secondary | ICD-10-CM | POA: Diagnosis present

## 2014-12-18 DIAGNOSIS — R6520 Severe sepsis without septic shock: Secondary | ICD-10-CM

## 2014-12-18 DIAGNOSIS — Z66 Do not resuscitate: Secondary | ICD-10-CM | POA: Diagnosis present

## 2014-12-18 LAB — PHOSPHORUS: Phosphorus: 1.1 mg/dL — ABNORMAL LOW (ref 2.3–4.6)

## 2014-12-18 LAB — URINALYSIS, ROUTINE W REFLEX MICROSCOPIC
BILIRUBIN URINE: NEGATIVE
Glucose, UA: NEGATIVE mg/dL
Ketones, ur: NEGATIVE mg/dL
NITRITE: POSITIVE — AB
PH: 5.5 (ref 5.0–8.0)
Protein, ur: NEGATIVE mg/dL
SPECIFIC GRAVITY, URINE: 1.011 (ref 1.005–1.030)
Urobilinogen, UA: 1 mg/dL (ref 0.0–1.0)

## 2014-12-18 LAB — MAGNESIUM: MAGNESIUM: 1.5 mg/dL (ref 1.5–2.5)

## 2014-12-18 LAB — CBC WITH DIFFERENTIAL/PLATELET
BASOS ABS: 0 10*3/uL (ref 0.0–0.1)
Basophils Relative: 0 % (ref 0–1)
Eosinophils Absolute: 0.1 10*3/uL (ref 0.0–0.7)
Eosinophils Relative: 1 % (ref 0–5)
HCT: 48.8 % — ABNORMAL HIGH (ref 36.0–46.0)
Hemoglobin: 15.6 g/dL — ABNORMAL HIGH (ref 12.0–15.0)
LYMPHS PCT: 12 % (ref 12–46)
Lymphs Abs: 0.8 10*3/uL (ref 0.7–4.0)
MCH: 28.9 pg (ref 26.0–34.0)
MCHC: 32 g/dL (ref 30.0–36.0)
MCV: 90.4 fL (ref 78.0–100.0)
MONO ABS: 0.1 10*3/uL (ref 0.1–1.0)
Monocytes Relative: 2 % — ABNORMAL LOW (ref 3–12)
NEUTROS PCT: 86 % — AB (ref 43–77)
Neutro Abs: 5.8 10*3/uL (ref 1.7–7.7)
Platelets: UNDETERMINED 10*3/uL (ref 150–400)
RBC: 5.4 MIL/uL — AB (ref 3.87–5.11)
RDW: 14.6 % (ref 11.5–15.5)
WBC: 6.8 10*3/uL (ref 4.0–10.5)

## 2014-12-18 LAB — TSH: TSH: 3.277 u[IU]/mL (ref 0.350–4.500)

## 2014-12-18 LAB — COMPREHENSIVE METABOLIC PANEL
ALBUMIN: 2.8 g/dL — AB (ref 3.5–5.2)
ALT: 15 U/L (ref 0–35)
ANION GAP: 13 (ref 5–15)
AST: 35 U/L (ref 0–37)
Alkaline Phosphatase: 73 U/L (ref 39–117)
BILIRUBIN TOTAL: 1.5 mg/dL — AB (ref 0.3–1.2)
BUN: 10 mg/dL (ref 6–23)
CO2: 21 mmol/L (ref 19–32)
CREATININE: 1.49 mg/dL — AB (ref 0.50–1.10)
Calcium: 8.9 mg/dL (ref 8.4–10.5)
Chloride: 112 mEq/L (ref 96–112)
GFR calc Af Amer: 36 mL/min — ABNORMAL LOW (ref >90)
GFR calc non Af Amer: 31 mL/min — ABNORMAL LOW (ref >90)
Glucose, Bld: 92 mg/dL (ref 70–99)
POTASSIUM: 2.9 mmol/L — AB (ref 3.5–5.1)
Sodium: 146 mmol/L — ABNORMAL HIGH (ref 135–145)
Total Protein: 5.6 g/dL — ABNORMAL LOW (ref 6.0–8.3)

## 2014-12-18 LAB — PROCALCITONIN: Procalcitonin: 29.14 ng/mL

## 2014-12-18 LAB — MRSA PCR SCREENING: MRSA by PCR: NEGATIVE

## 2014-12-18 LAB — CBG MONITORING, ED: Glucose-Capillary: 111 mg/dL — ABNORMAL HIGH (ref 70–99)

## 2014-12-18 LAB — URINE MICROSCOPIC-ADD ON

## 2014-12-18 LAB — LACTIC ACID, PLASMA: Lactic Acid, Venous: 5.1 mmol/L — ABNORMAL HIGH (ref 0.5–2.2)

## 2014-12-18 MED ORDER — ALBUTEROL SULFATE (2.5 MG/3ML) 0.083% IN NEBU
2.5000 mg | INHALATION_SOLUTION | RESPIRATORY_TRACT | Status: AC
  Administered 2014-12-18: 2.5 mg via RESPIRATORY_TRACT

## 2014-12-18 MED ORDER — BUDESONIDE 0.5 MG/2ML IN SUSP
0.5000 mg | Freq: Two times a day (BID) | RESPIRATORY_TRACT | Status: DC | PRN
  Filled 2014-12-18: qty 2

## 2014-12-18 MED ORDER — PREDNISONE 5 MG PO TABS
5.0000 mg | ORAL_TABLET | Freq: Every day | ORAL | Status: DC
  Filled 2014-12-18: qty 1

## 2014-12-18 MED ORDER — PANTOPRAZOLE SODIUM 40 MG PO TBEC
40.0000 mg | DELAYED_RELEASE_TABLET | Freq: Every day | ORAL | Status: DC
  Administered 2014-12-20 – 2014-12-24 (×4): 40 mg via ORAL
  Filled 2014-12-18 (×5): qty 1

## 2014-12-18 MED ORDER — SODIUM CHLORIDE 0.9 % IV BOLUS (SEPSIS)
1000.0000 mL | Freq: Once | INTRAVENOUS | Status: AC
  Administered 2014-12-18: 1000 mL via INTRAVENOUS

## 2014-12-18 MED ORDER — ALBUTEROL SULFATE (2.5 MG/3ML) 0.083% IN NEBU
2.5000 mg | INHALATION_SOLUTION | Freq: Three times a day (TID) | RESPIRATORY_TRACT | Status: DC
  Administered 2014-12-18 – 2014-12-24 (×17): 2.5 mg via RESPIRATORY_TRACT
  Filled 2014-12-18 (×18): qty 3

## 2014-12-18 MED ORDER — ENOXAPARIN SODIUM 30 MG/0.3ML ~~LOC~~ SOLN
30.0000 mg | SUBCUTANEOUS | Status: DC
  Administered 2014-12-18 – 2014-12-24 (×7): 30 mg via SUBCUTANEOUS
  Filled 2014-12-18 (×7): qty 0.3

## 2014-12-18 MED ORDER — ACETAMINOPHEN 650 MG RE SUPP
650.0000 mg | Freq: Four times a day (QID) | RECTAL | Status: DC | PRN
  Administered 2014-12-18 (×2): 650 mg via RECTAL
  Filled 2014-12-18 (×2): qty 1

## 2014-12-18 MED ORDER — PREDNISONE (PAK) 5 MG PO TABS: 5.0000 mg | ORAL_TABLET | Freq: Every day | ORAL | Status: DC

## 2014-12-18 MED ORDER — ALBUTEROL SULFATE (2.5 MG/3ML) 0.083% IN NEBU
2.5000 mg | INHALATION_SOLUTION | RESPIRATORY_TRACT | Status: DC | PRN
  Administered 2014-12-18 – 2014-12-19 (×3): 2.5 mg via RESPIRATORY_TRACT
  Filled 2014-12-18 (×5): qty 3

## 2014-12-18 MED ORDER — SODIUM CHLORIDE 0.9 % IV BOLUS (SEPSIS)
500.0000 mL | Freq: Once | INTRAVENOUS | Status: AC
  Administered 2014-12-18: 500 mL via INTRAVENOUS

## 2014-12-18 MED ORDER — ONDANSETRON HCL 4 MG PO TABS
4.0000 mg | ORAL_TABLET | Freq: Four times a day (QID) | ORAL | Status: DC | PRN
Start: 2014-12-18 — End: 2014-12-24

## 2014-12-18 MED ORDER — ALBUTEROL SULFATE (2.5 MG/3ML) 0.083% IN NEBU
2.5000 mg | INHALATION_SOLUTION | Freq: Four times a day (QID) | RESPIRATORY_TRACT | Status: DC
  Administered 2014-12-18: 2.5 mg via RESPIRATORY_TRACT
  Filled 2014-12-18: qty 3

## 2014-12-18 MED ORDER — FLUTICASONE PROPIONATE 50 MCG/ACT NA SUSP
2.0000 | Freq: Two times a day (BID) | NASAL | Status: DC | PRN
  Filled 2014-12-18: qty 16

## 2014-12-18 MED ORDER — PIPERACILLIN-TAZOBACTAM 3.375 G IVPB 30 MIN
3.3750 g | Freq: Once | INTRAVENOUS | Status: AC
  Administered 2014-12-18: 3.375 g via INTRAVENOUS
  Filled 2014-12-18: qty 50

## 2014-12-18 MED ORDER — VANCOMYCIN HCL IN DEXTROSE 750-5 MG/150ML-% IV SOLN
750.0000 mg | INTRAVENOUS | Status: DC
  Filled 2014-12-18: qty 150

## 2014-12-18 MED ORDER — VANCOMYCIN HCL 10 G IV SOLR: 20.0000 mg/kg | Freq: Once | INTRAVENOUS | Status: DC

## 2014-12-18 MED ORDER — PIPERACILLIN-TAZOBACTAM 3.375 G IVPB
3.3750 g | Freq: Three times a day (TID) | INTRAVENOUS | Status: DC
  Administered 2014-12-18 – 2014-12-19 (×3): 3.375 g via INTRAVENOUS
  Filled 2014-12-18 (×4): qty 50

## 2014-12-18 MED ORDER — GUAIFENESIN-DM 100-10 MG/5ML PO SYRP
5.0000 mL | ORAL_SOLUTION | ORAL | Status: DC | PRN
  Filled 2014-12-18: qty 5

## 2014-12-18 MED ORDER — POTASSIUM CHLORIDE 10 MEQ/100ML IV SOLN
10.0000 meq | INTRAVENOUS | Status: AC
Start: 2014-12-18 — End: 2014-12-18
  Administered 2014-12-18 (×2): 10 meq via INTRAVENOUS
  Filled 2014-12-18 (×4): qty 100

## 2014-12-18 MED ORDER — KETOTIFEN FUMARATE 0.025 % OP SOLN
1.0000 [drp] | Freq: Two times a day (BID) | OPHTHALMIC | Status: DC
  Administered 2014-12-18 – 2014-12-24 (×12): 1 [drp] via OPHTHALMIC
  Filled 2014-12-18 (×2): qty 5

## 2014-12-18 MED ORDER — ONDANSETRON HCL 4 MG/2ML IJ SOLN: 4.0000 mg | Freq: Four times a day (QID) | INTRAMUSCULAR | Status: DC | PRN

## 2014-12-18 MED ORDER — VANCOMYCIN HCL IN DEXTROSE 1-5 GM/200ML-% IV SOLN
1000.0000 mg | Freq: Once | INTRAVENOUS | Status: AC
  Administered 2014-12-18: 1000 mg via INTRAVENOUS
  Filled 2014-12-18: qty 200

## 2014-12-18 MED ORDER — SODIUM CHLORIDE 0.9 % IV SOLN
INTRAVENOUS | Status: DC
  Administered 2014-12-18 – 2014-12-19 (×2): via INTRAVENOUS

## 2014-12-18 MED ORDER — ACETAMINOPHEN 650 MG RE SUPP
650.0000 mg | Freq: Once | RECTAL | Status: AC
  Administered 2014-12-18: 650 mg via RECTAL
  Filled 2014-12-18: qty 1

## 2014-12-18 MED ORDER — POTASSIUM CHLORIDE 10 MEQ/100ML IV SOLN
10.0000 meq | INTRAVENOUS | Status: AC
Start: 2014-12-18 — End: 2014-12-18
  Administered 2014-12-18 (×3): 10 meq via INTRAVENOUS
  Filled 2014-12-18 (×2): qty 100

## 2014-12-18 NOTE — ED Provider Notes (Signed)
CSN: [id number]     Arrival date & time 12/18/14  1009 History   First MD Initiated Contact with Patient 12/18/14 1009     Chief Complaint  Patient presents with  . Altered Mental Status     (Consider location/radiation/quality/duration/timing/severity/associated sxs/prior Treatment) Patient is a 81 y.o. female presenting with altered mental status. The history is provided by the EMS personnel. The history is limited by the absence of a caregiver and the condition of the patient. No language interpreter was used.  Altered Mental Status Presenting symptoms: partial responsiveness   Severity:  Severe Most recent episode:  Today Episode history:  Single Timing:  Constant Progression:  Unchanged Chronicity:  New Context: dementia   Context: not alcohol use, not head injury, taking medications as prescribed, not a nursing home resident, not a recent change in medication, not a recent illness and not a recent infection     Past Medical History  Diagnosis Date  . Asthma   . Hypertension   . Osteoporosis   . GERD (gastroesophageal reflux disease)   . Pulmonary embolism     hx of 4/09, sees Dr. Sherene SiresWert   . Leg edema   . DVT (deep venous thrombosis)     "?just one side"  . Pneumonia ?X1  . Asthmatic bronchitis   . Arthritis     "back mostly" (09/19/2014)  . Kidney stones   . Frequent UTI     "when she was younger" (09/19/2014)  . Dementia     "moderate to us; she talks to us in the family" (09/19/2014)   Past Surgical History  Procedure Laterality Date  . Cholecystectomy    . Lithotripsy    . Knee arthroscopy Right   . Hip fracture surgery Left   . Fracture surgery    . Carpal tunnel release Right   . Dilation and curettage of uterus     Family History  Problem Relation Age of Onset  . Arthritis Other   . Hypertension Other   . Cancer Other     lung   History  Substance Use Topics  . Smoking status: Never Smoker   . Smokeless tobacco: Never Used  . Alcohol Use: No    OB History    No data available     Review of Systems  Unable to perform ROS: Patient unresponsive      Allergies  Review of patient's allergies indicates no known allergies.  Home Medications   Prior to Admission medications   Medication Sig Start Date End Date Taking? Authorizing Provider  acetaminophen (TYLENOL) 500 MG tablet Take 1,000 mg by mouth 2 (two) times daily.    Yes Historical Provider, MD  albuterol (PROVENTIL) (2.5 MG/3ML) 0.083% nebulizer solution Take 2.5 mg by nebulization every 6 (six) hours as needed. For shortness of breath   Yes Historical Provider, MD  azelastine (OPTIVAR) 0.05 % ophthalmic solution Place 1 drop into both eyes 2 (two) times daily.     Yes Historical Provider, MD  budesonide (PULMICORT) 0.5 MG/2ML nebulizer solution Take 0.5 mg by nebulization 2 (two) times daily as needed. wheezing   Yes Historical Provider, MD  calcium carbonate (TUMS - DOSED IN MG ELEMENTAL CALCIUM) 500 MG chewable tablet Chew 1 tablet by mouth daily.    Yes Historical Provider, MD  Cholecalciferol (VITAMIN D3) 2000 UNITS TABS Take 2,000 Units by mouth daily.   Yes Historical Provider, MD  Dextromethorphan-Guaifenesin (Q-TUSSIN DM) 10-100 MG/5ML liquid Take 10 mLs by mouth every 6 (six)  hours as needed (for cough/ congestion).   Yes Historical Provider, MD  feeding supplement, ENSURE COMPLETE, (ENSURE COMPLETE) LIQD Take 237 mLs by mouth 2 (two) times daily between meals. 09/29/14  Yes Carly Rivet, MD  fluticasone (FLONASE) 50 MCG/ACT nasal spray Place 2 sprays into the nose 2 (two) times daily as needed. For congestion and runny nose   Yes Historical Provider, MD  magnesium hydroxide (MILK OF MAGNESIA) 400 MG/5ML suspension Take 15 mLs by mouth daily as needed for mild constipation.   Yes Historical Provider, MD  memantine (NAMENDA) 10 MG tablet Take 10 mg by mouth 2 (two) times daily.     Yes Historical Provider, MD  montelukast (SINGULAIR) 10 MG tablet Take 10 mg by mouth  at bedtime.     Yes Historical Provider, MD  Multiple Vitamins-Minerals (MULTIVITAMINS THER. W/MINERALS) TABS Take 1 tablet by mouth daily.     Yes Historical Provider, MD  omeprazole (PRILOSEC) 40 MG capsule Take 40 mg by mouth daily.   Yes Historical Provider, MD  predniSONE (DELTASONE) 5 MG tablet Take 5 mg by mouth daily.   Yes Historical Provider, MD  predniSONE (STERAPRED UNI-PAK) 5 MG TABS tablet Take 5 mg by mouth daily.    Historical Provider, MD  STARCH-MALTO DEXTRIN (THICK-IT) POWD Take 1 application by mouth daily as needed (nectar consistency).    Historical Provider, MD   BP 94/74 mmHg  Pulse 127  Temp(Src) 98.5 F (36.9 C) (Axillary)  Resp 27  Ht 4\' 9"  (1.448 m)  Wt 147 lb 11.3 oz (67 kg)  BMI 31.95 kg/m2  SpO2 100% Physical Exam  Constitutional: She appears well-developed and well-nourished. No distress.  HENT:  Head: Normocephalic and atraumatic.  Eyes: Pupils are equal, round, and reactive to light.  Neck: Normal range of motion.  Cardiovascular: Regular rhythm, normal heart sounds and intact distal pulses.  Tachycardia present.   Pulmonary/Chest: Effort normal. No respiratory distress. She has no wheezes. She has rhonchi in the right upper field, the right middle field, the right lower field, the left upper field, the left middle field and the left lower field. She exhibits no tenderness.  Abdominal: Soft. Bowel sounds are normal. She exhibits no distension. There is no tenderness. There is no rebound and no guarding.  Neurological: She has normal strength. She is unresponsive. No cranial nerve deficit or sensory deficit. She exhibits normal muscle tone. Coordination and gait normal. GCS eye subscore is 2. GCS verbal subscore is 2. GCS motor subscore is 4.  Withdraws all extremities from pain symmetrically.    Skin: Skin is warm and dry.  Nursing note and vitals reviewed.   ED Course  Procedures (including critical care time) Labs Review Labs Reviewed  CBC WITH  DIFFERENTIAL - Abnormal; Notable for the following:    RBC 5.40 (*)    Hemoglobin 15.6 (*)    HCT 48.8 (*)    Neutrophils Relative % 86 (*)    Monocytes Relative 2 (*)    All other components within normal limits  COMPREHENSIVE METABOLIC PANEL - Abnormal; Notable for the following:    Sodium 146 (*)    Potassium 2.9 (*)    Creatinine, Ser 1.49 (*)    Total Protein 5.6 (*)    Albumin 2.8 (*)    Total Bilirubin 1.5 (*)    GFR calc non Af Amer 31 (*)    GFR calc Af Amer 36 (*)    All other components within normal limits  LACTIC ACID, PLASMA -  Abnormal; Notable for the following:    Lactic Acid, Venous 5.1 (*)    All other components within normal limits  URINALYSIS, ROUTINE W REFLEX MICROSCOPIC - Abnormal; Notable for the following:    APPearance CLOUDY (*)    Hgb urine dipstick MODERATE (*)    Nitrite POSITIVE (*)    Leukocytes, UA LARGE (*)    All other components within normal limits  URINE MICROSCOPIC-ADD ON - Abnormal; Notable for the following:    Squamous Epithelial / LPF FEW (*)    Bacteria, UA MANY (*)    All other components within normal limits  PHOSPHORUS - Abnormal; Notable for the following:    Phosphorus 1.1 (*)    All other components within normal limits  CBG MONITORING, ED - Abnormal; Notable for the following:    Glucose-Capillary 111 (*)    All other components within normal limits  MRSA PCR SCREENING  CULTURE, BLOOD (ROUTINE X 2)  CULTURE, BLOOD (ROUTINE X 2)  URINE CULTURE  CULTURE, EXPECTORATED SPUTUM-ASSESSMENT  TSH  MAGNESIUM  PROCALCITONIN  BASIC METABOLIC PANEL  CBC  PROCALCITONIN    Imaging Review Ct Head Wo Contrast  12/18/2014   CLINICAL DATA:  Altered mental status  EXAM: CT HEAD WITHOUT CONTRAST  TECHNIQUE: Contiguous axial images were obtained from the base of the skull through the vertex without intravenous contrast.  COMPARISON:  September 19, 2014  FINDINGS: There is moderate diffuse atrophy, stable. There is no intracranial mass,  hemorrhage, extra-axial fluid collection, or midline shift. There is small vessel disease throughout the centra semiovale bilaterally. There is small vessel disease with prior lacunar infarcts in the anterior limbs of the left internal and external capsules. No new gray-white compartment lesion is identified. There are several lacunar infarcts in the right external capsule. There is no acute appearing infarct. Bony calvarium appears intact. Visualized mastoid air cells are clear. There are air-fluid levels in each maxillary antrum. There is extensive ethmoid sinus disease bilaterally.  IMPRESSION: Atrophy with extensive small vessel disease. Prior lacunar infarcts in the basal ganglia regions bilaterally. No acute infarct apparent. No hemorrhage or mass effect. Paranasal sinus disease is noted at multiple sites.   Electronically Signed   By: Bretta Bang M.D.   On: 12/18/2014 12:55   Dg Chest Portable 1 View  12/18/2014   CLINICAL DATA:  Fever and congestion ; altered mental status  EXAM: PORTABLE CHEST - 1 VIEW  COMPARISON:  September 25, 2014  FINDINGS: There is focal consolidation right upper lobe adjacent to the minor fissure. There is underlying emphysema. Elsewhere lungs are clear. Heart size and pulmonary vascularity are normal. No adenopathy. There are chronic rotator cuff tears in each shoulder. Stomach is distended with air.  IMPRESSION: Focal consolidation right upper lobe adjacent to the minor fissure. Lungs elsewhere clear. Underlying emphysema. Stomach distended with air.   Electronically Signed   By: Bretta Bang M.D.   On: 12/18/2014 10:47     EKG Interpretation None      MDM   Final diagnoses:  Altered mental status  HCAP (healthcare-associated pneumonia)  UTI (lower urinary tract infection)  Severe sepsis  Lactic acidosis    Patient is an 81 year old African-American female with a pertinent past medical history of dementia, frequent urinary tract infections, and recent  pneumonia who comes to the emergency department today with altered mental status. Physical exam as above. Upon arrival patient is febrile to 102.9, she is tachycardic and tachypneic. As a result a code sepsis  was called. Initial workup included a CBC, CMP, lactic acid, UA, urine culture, blood cultures 2, chest x-ray, and a CT of her head without contrast. UA demonstrated many leukocytes, positive nitrites, and bacteria concerning for urinary tract infection. Chest x-ray demonstrated a right upper lobe consolidation concerning for pneumonia. Patient was recently in the hospital 3 months ago as result this is concerning for healthcare associated pneumonia. She was treated with vancomycin and Zosyn. Zosyn should also be sufficient to cover her urinary tract infection. Patient was treated with a total of 2 L of normal saline with improvement in her tachycardia from the 130s down to the 110s. Patient was felt to require admission to the stepdown unit for severe sepsis. With a nonfocal neurologic exam I doubt a CVA. Patient's creatinine is 1.49 which is at her baseline. Her potassium was low at 2.9. She was administered a total of 40 meq of potassium IV. Patient was admitted to the stepdown unit in an improved critical condition.  Labs and imaging reviewed by myself and considered in medical decision making. Imaging was interpreted by radiology. Care was discussed with attending Dr. Jodi Mourning.     Bethann Berkshire, MD 12/18/14 9562  Enid Skeens, MD 12/24/14 [phone]

## 2014-12-18 NOTE — Progress Notes (Signed)
Admission orders placed. Requested admission for management of sepsis secondary to presumptive HCAP (given recent hospitalization. [facility] to assume care at this point.   Debbora PrestoMAGICK-Khrystina Bonnes, MD  [facility] Pager [phone] Cell [phone]  If 7PM-7AM, please contact night-coverage [email or web address] Password [email or web address]

## 2014-12-18 NOTE — ED Notes (Signed)
Second set of blood cultures drawn before vancomycin started.

## 2014-12-18 NOTE — ED Notes (Signed)
Per ems pt was alert and talking to family last night. Daughter found her this morning to be altered. Pt grimaces to pain and will open eyes to voice. Pt is warm to touch

## 2014-12-18 NOTE — Progress Notes (Addendum)
Pt. Has rhonchi and expiratory wheezes after nebulizer treatment and Pt BP 71/43 after 1L bolus. Dr. Craige CottaKirby notified and ordered a 500cc bolus and one time dose albuterol treatment. Will continue to monitor patient.

## 2014-12-18 NOTE — Progress Notes (Addendum)
ANTIBIOTIC CONSULT NOTE - INITIAL  Pharmacy Consult for Vancomycin and Zosyn Indication: Rule out Sepsis (2/2 to presumptive HCAP)  No Known Allergies  Patient Measurements: Height: 5' (152.4 cm) Weight: 130 lb (58.968 kg) IBW/kg (Calculated) : 45.5  Vital Signs: Temp: 100 F (37.8 C) (01/18 1400) Temp Source: Rectal (01/18 1400) BP: 100/44 mmHg (01/18 1530) Pulse Rate: 113 (01/18 1315) Intake/Output from previous day:   Intake/Output from this shift: Total I/O In: 1500 [I.V.:500; IV Piggyback:1000] Out: -   Labs:  Recent Labs  12/18/14 1113  WBC 6.8  HGB 15.6*  PLT PLATELET CLUMPS NOTED ON SMEAR, UNABLE TO ESTIMATE  CREATININE 1.49*   Estimated Creatinine Clearance: 23 mL/min (by C-G formula based on Cr of 1.49). No results for input(s): VANCOTROUGH, VANCOPEAK, VANCORANDOM, GENTTROUGH, GENTPEAK, GENTRANDOM, TOBRATROUGH, TOBRAPEAK, TOBRARND, AMIKACINPEAK, AMIKACINTROU, AMIKACIN in the last 72 hours.   Microbiology: No results found for this or any previous visit (from the past 720 hour(s)).  Medical History: Past Medical History  Diagnosis Date  . Asthma   . Hypertension   . Osteoporosis   . GERD (gastroesophageal reflux disease)   . Pulmonary embolism     hx of 4/09, sees Dr. Sherene SiresWert   . Leg edema   . DVT (deep venous thrombosis)     "?just one side"  . Pneumonia ?X1  . Asthmatic bronchitis   . Arthritis     "back mostly" (09/19/2014)  . Kidney stones   . Frequent UTI     "when she was younger" (09/19/2014)  . Dementia     "moderate to us; she talks to us in the family" (09/19/2014)    Medications:  Prescriptions prior to admission  Medication Sig Dispense Refill Last Dose  . acetaminophen (TYLENOL) 500 MG tablet Take 1,000 mg by mouth 2 (two) times daily.    09/19/2014 at Unknown time  . albuterol (PROVENTIL) (2.5 MG/3ML) 0.083% nebulizer solution Take 2.5 mg by nebulization every 6 (six) hours as needed. For shortness of breath   09/19/2014 at  Unknown time  . Ascorbic Acid (VITAMIN C) 500 MG tablet Take 500 mg by mouth as needed (for congestion).    09/18/2014 at Unknown time  . azelastine (OPTIVAR) 0.05 % ophthalmic solution Place 1 drop into both eyes 2 (two) times daily.     09/19/2014 at Unknown time  . budesonide (PULMICORT) 0.5 MG/2ML nebulizer solution Take 0.5 mg by nebulization 2 (two) times daily as needed. wheezing   Past Week at Unknown time  . calcium carbonate (TUMS - DOSED IN MG ELEMENTAL CALCIUM) 500 MG chewable tablet Chew 1 tablet by mouth daily.    09/19/2014 at Unknown time  . Cholecalciferol (VITAMIN D3) 2000 UNITS TABS Take 2,000 Units by mouth daily.   09/19/2014 at Unknown time  . Dextromethorphan-Guaifenesin (Q-TUSSIN DM) 10-100 MG/5ML liquid Take 10 mLs by mouth every 6 (six) hours as needed (for cough/ congestion).   09/18/2014 at Unknown time  . feeding supplement, ENSURE COMPLETE, (ENSURE COMPLETE) LIQD Take 237 mLs by mouth 2 (two) times daily between meals. 10 Bottle 2   . fluticasone (FLONASE) 50 MCG/ACT nasal spray Place 2 sprays into the nose 2 (two) times daily as needed. For congestion and runny nose   09/19/2014 at Unknown time  . magnesium hydroxide (MILK OF MAGNESIA) 400 MG/5ML suspension Take 15 mLs by mouth daily as needed for mild constipation.   Past Month at Unknown time  . memantine (NAMENDA) 10 MG tablet Take 10 mg by mouth  2 (two) times daily.     09/19/2014 at Unknown time  . montelukast (SINGULAIR) 10 MG tablet Take 10 mg by mouth at bedtime.     09/18/2014 at Unknown time  . moxifloxacin (AVELOX) 400 MG tablet Take 1 tablet (400 mg total) by mouth daily at 8 pm. 5 tablet 0   . Multiple Vitamins-Minerals (MULTIVITAMINS THER. W/MINERALS) TABS Take 1 tablet by mouth daily.     09/19/2014 at Unknown time  . omeprazole (PRILOSEC) 40 MG capsule Take 40 mg by mouth daily.   09/19/2014 at Unknown time  . predniSONE (STERAPRED UNI-PAK) 5 MG TABS tablet Take 5 mg by mouth daily.   09/19/2014 at  Unknown time  . STARCH-MALTO DEXTRIN (THICK-IT) POWD Take 1 application by mouth daily as needed (nectar consistency).   09/19/2014 at Unknown time   Scheduled:  . piperacillin-tazobactam (ZOSYN)  IV  3.375 g Intravenous 3 times per day  . [START ON 12/19/2014] vancomycin  750 mg Intravenous Q24H   Assessment:  85 YOF presenting to [facility] on 12/18/14 c/o altered mental status.  Patient was previously hospitalized in October for septic shock 2/2 to UTI and aspriation pneumonia and AKI.  Patient was discharged on 10 day course of levaquin 750 mg daily.  Pharmacy has been consulted to dose vancomycin and zosyn to rule out sepsis 2/2 to presumptive HCAP.  Scr 1.49 (BL Scr 1.2), WBC wnl, Tmax 100.0, HR up 113, RR up 30. UA many facteria, large LE, and negative ketones.  Cultures have been drawn before initiation of antibiotics.    Goal of Therapy:  Vancomycin trough level 15-20 mcg/ml  Plan:  - Vancomcyin 750 Q24H @ 1122 1/19 - Zosyn 3.375 g IV Q8H - Monitor for clinical efficacy and trough levels when appropriate - F/U cultures  Red Christians, Pharm. D. Clinical Pharmacy Resident Pager: [phone] Ph: [phone] 12/18/2014 4:28 PM

## 2014-12-18 NOTE — H&P (Signed)
[facility] History and Physical  [name] ZOX:[id number] DOB: 1929-05-09 DOA: 12/18/2014  Referring physician: ED physician PCP: Thane Edu, MD   Chief Complaint: Altered mental status  HPI:  Patient is 81 year old female with hypertension, moderate dementia, asthma, presented to [facility] emergency department for further evaluation of progressively worsening confusion and lethargy at home. Please note that patient is unable to provide any history of the time of the admission, daughter at bedside able to assist with details. She explains her mother has been sick for the past week, she has taken her mom to primary care physician, no medications given at that time. Patient has continued to become more confused, daughter noted fevers as high as 101 Fahrenheit, shortness of breath noted with exertion, productive cough of yellow sputum also noted for the past 24 hours. No reported abdominal concerns. Daughter also reports dark and malodorous urine. Of note, patient was recently discharged from the hospital in October 2015 after being treated for altered mental status secondary to sepsis in the setting of aspiration pneumonia.  In emergency department, patient noted to be lethargic, vital signs notable for temperature 102.2 Fahrenheit, heart rate 1:15, respiratory rate 33 breaths per minute, blood pressure 73/39. Blood work notable for hemoglobin 15.6, sodium 146, potassium 2.9, creatinine 1.49. Chest x-ray consistent with right upper lobe pneumonia. Tried hospitalist asked to admit to step down unit for further evaluation.  Assessment and Plan: Active Problems:   Sepsis - Secondary to pneumonia, HCAP given recent hospitalization, UTI suggested based on symptoms and urinalysis - Admit to step down unit - Continue vancomycin and Zosyn that was started in emergency department - Provide IV fluids, so far 1 L normal saline bolus given - Keep on oxygen as needed   Acute renal  failure - Secondary to prerenal etiology, sepsis - IV fluids as noted above and repeat BMP in the morning   Electrolyte abnormalities - Including sodium and potassium - Supplement potassium, as noted above continuing IV fluids with boluses if needed - Repeat electrolyte panel in the morning   Severe deconditioning - With progressive failure to thrive, high risk aspiration - Will need SLP once more clinically stable - Discussed CODE STATUS with family, family confirms DO NOT RESUSCITATE status - Family very clear with decision to keep mother comfortable, agreeable with IV fluids and antibiotics, no pressors and no intubations    UTI - Current antibiotics should be adequate in coverage, follow-up on urine cultures   HCAP - Broad-spectrum antibiotics as noted above    DVT prophylaxis: Lovenox subcutaneous  Radiological Exams on Admission: Dg Chest Portable 1 View  12/18/2014   CLINICAL DATA:  Fever and congestion ; altered mental status  EXAM: PORTABLE CHEST - 1 VIEW  COMPARISON:  September 25, 2014  FINDINGS: There is focal consolidation right upper lobe adjacent to the minor fissure. There is underlying emphysema. Elsewhere lungs are clear. Heart size and pulmonary vascularity are normal. No adenopathy. There are chronic rotator cuff tears in each shoulder. Stomach is distended with air.  IMPRESSION: Focal consolidation right upper lobe adjacent to the minor fissure. Lungs elsewhere clear. Underlying emphysema. Stomach distended with air.   Electronically Signed   By: Bretta Bang M.D.   On: 12/18/2014 10:47    Code Status: Full Family Communication: Family at bedside  at bedside Disposition Plan: Admit for further evaluation     Review of Systems: Per daughter at bedside Constitutional:  Negative for diaphoresis.  HENT: Negative for hearing loss,  ear pain, nosebleeds, congestion, sore throat, neck pain, tinnitus and ear discharge.   Eyes: Negative for blurred vision, double vision,  photophobia, pain, discharge and redness.  Respiratory: Negative for  wheezing and stridor.   Cardiovascular: Negative for chest pain and leg swelling.  Gastrointestinal: Negative for nausea, vomiting and abdominal pain.  Genitourinary: Negative for hematuria and flank pain.  Musculoskeletal: Negative for myalgias, back pain, joint pain and falls.  Skin: Negative for itching and rash.  Neurological: Negative for dizziness and weakness.  Endo/Heme/Allergies: Negative for environmental allergies and polydipsia. Does not bruise/bleed easily.  Psychiatric/Behavioral: Negative for suicidal ideas. The patient is not nervous/anxious.      Past Medical History  Diagnosis Date  . Asthma   . Hypertension   . Osteoporosis   . GERD (gastroesophageal reflux disease)   . Pulmonary embolism     hx of 4/09, sees Dr. Sherene SiresWert   . Leg edema   . DVT (deep venous thrombosis)     "?just one side"  . Pneumonia ?X1  . Asthmatic bronchitis   . Arthritis     "back mostly" (09/19/2014)  . Kidney stones   . Frequent UTI     "when she was younger" (09/19/2014)  . Dementia     "moderate to us; she talks to us in the family" (09/19/2014)    Past Surgical History  Procedure Laterality Date  . Cholecystectomy    . Lithotripsy    . Knee arthroscopy Right   . Hip fracture surgery Left   . Fracture surgery    . Carpal tunnel release Right   . Dilation and curettage of uterus      Social History:  reports that she has never smoked. She has never used smokeless tobacco. She reports that she does not drink alcohol or use illicit drugs.  No Known Allergies  Family History  Problem Relation Age of Onset  . Arthritis Other   . Hypertension Other   . Cancer Other     lung    Prior to Admission medications   Medication Sig Start Date End Date Taking? Authorizing Provider  acetaminophen (TYLENOL) 500 MG tablet Take 1,000 mg by mouth 2 (two) times daily.     Historical Provider, MD  albuterol (PROVENTIL)  (2.5 MG/3ML) 0.083% nebulizer solution Take 2.5 mg by nebulization every 6 (six) hours as needed. For shortness of breath    Historical Provider, MD  Ascorbic Acid (VITAMIN C) 500 MG tablet Take 500 mg by mouth as needed (for congestion).     Historical Provider, MD  azelastine (OPTIVAR) 0.05 % ophthalmic solution Place 1 drop into both eyes 2 (two) times daily.      Historical Provider, MD  budesonide (PULMICORT) 0.5 MG/2ML nebulizer solution Take 0.5 mg by nebulization 2 (two) times daily as needed. wheezing    Historical Provider, MD  calcium carbonate (TUMS - DOSED IN MG ELEMENTAL CALCIUM) 500 MG chewable tablet Chew 1 tablet by mouth daily.     Historical Provider, MD  Cholecalciferol (VITAMIN D3) 2000 UNITS TABS Take 2,000 Units by mouth daily.    Historical Provider, MD  Dextromethorphan-Guaifenesin (Q-TUSSIN DM) 10-100 MG/5ML liquid Take 10 mLs by mouth every 6 (six) hours as needed (for cough/ congestion).    Historical Provider, MD  feeding supplement, ENSURE COMPLETE, (ENSURE COMPLETE) LIQD Take 237 mLs by mouth 2 (two) times daily between meals. 09/29/14   Rich Numberarly Rivet, MD  fluticasone (FLONASE) 50 MCG/ACT nasal spray Place 2 sprays into the nose  2 (two) times daily as needed. For congestion and runny nose    Historical Provider, MD  magnesium hydroxide (MILK OF MAGNESIA) 400 MG/5ML suspension Take 15 mLs by mouth daily as needed for mild constipation.    Historical Provider, MD  memantine (NAMENDA) 10 MG tablet Take 10 mg by mouth 2 (two) times daily.      Historical Provider, MD  montelukast (SINGULAIR) 10 MG tablet Take 10 mg by mouth at bedtime.      Historical Provider, MD  moxifloxacin (AVELOX) 400 MG tablet Take 1 tablet (400 mg total) by mouth daily at 8 pm. 09/29/14   Yolanda Manges, DO  Multiple Vitamins-Minerals (MULTIVITAMINS THER. W/MINERALS) TABS Take 1 tablet by mouth daily.      Historical Provider, MD  omeprazole (PRILOSEC) 40 MG capsule Take 40 mg by mouth daily.     Historical Provider, MD  predniSONE (STERAPRED UNI-PAK) 5 MG TABS tablet Take 5 mg by mouth daily.    Historical Provider, MD  STARCH-MALTO DEXTRIN (THICK-IT) POWD Take 1 application by mouth daily as needed (nectar consistency).    Historical Provider, MD    Physical Exam: Filed Vitals:   12/18/14 1126 12/18/14 1130 12/18/14 1145 12/18/14 1200  BP:  119/54 164/61 90/47  Pulse:  115 147 117  Temp: 102.2 F (39 C)     TempSrc: Rectal     Resp:  23 33 29  Height:      Weight:      SpO2:  100% 99% 100%    Physical Exam  Constitutional: Appears calm, not in acute distress  HENT: Normocephalic. External right and left ear normal. Dry mucous membranes  Eyes: Conjunctivae and EOM are normal. PERRLA, no scleral icterus.  Neck: Normal ROM. Neck supple. No JVD. No tracheal deviation. No thyromegaly.  CVS: Regular rhythm, tachycardic, no gallops, no carotid bruit.  Pulmonary: Effort and breath sounds normal, Rales worse on the right side, diminished breath sounds at bases  Abdominal: Soft. BS +,  no distension, tenderness, rebound or guarding.  Musculoskeletal: No tenderness to palpation in upper and lower extremities, no signs of effusions in knee or elbow joints  Lymphadenopathy: No lymphadenopathy noted, cervical, inguinal. Neuro: Alert at this time but nonverbal, not following commands, moving all 4 extremities spontaneously  Skin: Skin is warm and dry. No rash noted. Not diaphoretic. No erythema. No pallor.  Psychiatric:  difficult to assess given altered mental status   Labs on Admission:  Basic Metabolic Panel:  Recent Labs Lab 12/18/14 1113  NA 146*  K 2.9*  CL 112  CO2 21  GLUCOSE 92  BUN 10  CREATININE 1.49*  CALCIUM 8.9   Liver Function Tests:  Recent Labs Lab 12/18/14 1113  AST 35  ALT 15  ALKPHOS 73  BILITOT 1.5*  PROT 5.6*  ALBUMIN 2.8*   CBC:  Recent Labs Lab 12/18/14 1113  WBC 6.8  NEUTROABS 5.8  HGB 15.6*  HCT 48.8*  MCV 90.4  PLT PLATELET  CLUMPS NOTED ON SMEAR, UNABLE TO ESTIMATE   CBG:  Recent Labs Lab 12/18/14 1011  GLUCAP 111*    EKG: Normal sinus rhythm, no ST/T wave changes  Debbora Presto, MD  [facility] Pager [phone]  If 7PM-7AM, please contact night-coverage [email or web address] Password [facility] 12/18/2014, 12:26 PM

## 2014-12-19 DIAGNOSIS — R404 Transient alteration of awareness: Secondary | ICD-10-CM

## 2014-12-19 DIAGNOSIS — J189 Pneumonia, unspecified organism: Secondary | ICD-10-CM

## 2014-12-19 DIAGNOSIS — A419 Sepsis, unspecified organism: Principal | ICD-10-CM

## 2014-12-19 DIAGNOSIS — R6520 Severe sepsis without septic shock: Secondary | ICD-10-CM

## 2014-12-19 LAB — CBC
HCT: 40.4 % (ref 36.0–46.0)
HEMOGLOBIN: 12.9 g/dL (ref 12.0–15.0)
MCH: 28.7 pg (ref 26.0–34.0)
MCHC: 31.9 g/dL (ref 30.0–36.0)
MCV: 89.8 fL (ref 78.0–100.0)
Platelets: 92 10*3/uL — ABNORMAL LOW (ref 150–400)
RBC: 4.5 MIL/uL (ref 3.87–5.11)
RDW: 14.7 % (ref 11.5–15.5)
WBC: 14.1 10*3/uL — ABNORMAL HIGH (ref 4.0–10.5)

## 2014-12-19 LAB — BASIC METABOLIC PANEL
Anion gap: 13 (ref 5–15)
BUN: 13 mg/dL (ref 6–23)
CO2: 13 mmol/L — ABNORMAL LOW (ref 19–32)
Calcium: 7.6 mg/dL — ABNORMAL LOW (ref 8.4–10.5)
Chloride: 121 mEq/L — ABNORMAL HIGH (ref 96–112)
Creatinine, Ser: 2.16 mg/dL — ABNORMAL HIGH (ref 0.50–1.10)
GFR calc Af Amer: 23 mL/min — ABNORMAL LOW (ref >90)
GFR, EST NON AFRICAN AMERICAN: 20 mL/min — AB (ref >90)
GLUCOSE: 41 mg/dL — AB (ref 70–99)
Potassium: 4.1 mmol/L (ref 3.5–5.1)
Sodium: 147 mmol/L — ABNORMAL HIGH (ref 135–145)

## 2014-12-19 LAB — CLOSTRIDIUM DIFFICILE BY PCR: CDIFFPCR: NEGATIVE

## 2014-12-19 LAB — GLUCOSE, CAPILLARY
GLUCOSE-CAPILLARY: 37 mg/dL — AB (ref 70–99)
GLUCOSE-CAPILLARY: 83 mg/dL (ref 70–99)
GLUCOSE-CAPILLARY: 155 mg/dL — AB (ref 70–99)
Glucose-Capillary: 61 mg/dL — ABNORMAL LOW (ref 70–99)
Glucose-Capillary: 63 mg/dL — ABNORMAL LOW (ref 70–99)
Glucose-Capillary: 85 mg/dL (ref 70–99)
Glucose-Capillary: 100 mg/dL — ABNORMAL HIGH (ref 70–99)
Glucose-Capillary: 122 mg/dL — ABNORMAL HIGH (ref 70–99)

## 2014-12-19 LAB — PROCALCITONIN: Procalcitonin: 49.16 ng/mL

## 2014-12-19 MED ORDER — SODIUM CHLORIDE 0.9 % IV BOLUS (SEPSIS)
1000.0000 mL | Freq: Once | INTRAVENOUS | Status: AC
  Administered 2014-12-19: 1000 mL via INTRAVENOUS

## 2014-12-19 MED ORDER — DEXTROSE 50 % IV SOLN
25.0000 mL | Freq: Once | INTRAVENOUS | Status: AC
  Administered 2014-12-19: 25 mL via INTRAVENOUS
  Filled 2014-12-19: qty 50

## 2014-12-19 MED ORDER — DEXTROSE 50 % IV SOLN
INTRAVENOUS | Status: AC
  Administered 2014-12-19: 25 mL via INTRAVENOUS
  Filled 2014-12-19: qty 50

## 2014-12-19 MED ORDER — METHYLPREDNISOLONE SODIUM SUCC 40 MG IJ SOLR
20.0000 mg | Freq: Three times a day (TID) | INTRAMUSCULAR | Status: DC
  Administered 2014-12-19 – 2014-12-21 (×6): 20 mg via INTRAVENOUS
  Filled 2014-12-19 (×11): qty 0.5

## 2014-12-19 MED ORDER — VANCOMYCIN HCL IN DEXTROSE 1-5 GM/200ML-% IV SOLN
1000.0000 mg | INTRAVENOUS | Status: DC
  Administered 2014-12-20 – 2014-12-22 (×2): 1000 mg via INTRAVENOUS
  Filled 2014-12-19 (×4): qty 200

## 2014-12-19 MED ORDER — PIPERACILLIN-TAZOBACTAM IN DEX 2-0.25 GM/50ML IV SOLN
2.2500 g | Freq: Three times a day (TID) | INTRAVENOUS | Status: DC
  Administered 2014-12-19 – 2014-12-22 (×10): 2.25 g via INTRAVENOUS
  Filled 2014-12-19 (×13): qty 50

## 2014-12-19 MED ORDER — DEXTROSE 5 % IV SOLN
INTRAVENOUS | Status: DC
  Administered 2014-12-19 – 2014-12-20 (×2): via INTRAVENOUS

## 2014-12-19 NOTE — Consult Note (Signed)
Name: [name] MRN: [id number] DOB: 1929/09/12    ADMISSION DATE:  12/18/2014 CONSULTATION DATE:  1/19  REFERRING MD :  Izola Price  CHIEF COMPLAINT:  Septic shock/ clarification of goals of care  BRIEF PATIENT DESCRIPTION:  81 year old demented female, resides w/ family. Essentially non-verbal, requires assist w/ most ADLs. Admitted on 1/18 w/ working dx of aspiration PNA and resultant shock. Remained hypotensive t/o the night after admit. PCCM asked to see re: persistent hypotension/shock to assist family w/ identifying goals of care.   SIGNIFICANT EVENTS    STUDIES:  CT head 1/18: Atrophy with extensive small vessel disease. Prior lacunar infarcts in the basal ganglia regions bilaterally. No acute infarct apparent. No hemorrhage or mass effect. Paranasal sinus disease is noted at multiple sites.   HISTORY OF PRESENT ILLNESS:    81 year old female with hypertension, moderate dementia, asthma, presented to [facility] emergency department for further evaluation of progressively worsening confusion, fever and lethargy at home on 1/18. Admitted w/ working dx of probable aspiration PNA, and UTI. Had persistent hypotension over the PM hours, refractory to fluid challenges. PCCM asked to see on 1/19 for septic shock and to assist w/ clarifications of goals of care.    PAST MEDICAL HISTORY :   has a past medical history of Asthma; Hypertension; Osteoporosis; GERD (gastroesophageal reflux disease); Pulmonary embolism; Leg edema; DVT (deep venous thrombosis); Pneumonia (?X1); Asthmatic bronchitis; Arthritis; Kidney stones; Frequent UTI; and Dementia.  has past surgical history that includes Cholecystectomy; Lithotripsy; Knee arthroscopy (Right); Hip fracture surgery (Left); Fracture surgery; Carpal tunnel release (Right); and Dilation and curettage of uterus. Prior to Admission medications   Medication Sig Start Date End Date Taking? Authorizing Provider  acetaminophen (TYLENOL) 500 MG  tablet Take 1,000 mg by mouth 2 (two) times daily.    Yes Historical Provider, MD  albuterol (PROVENTIL) (2.5 MG/3ML) 0.083% nebulizer solution Take 2.5 mg by nebulization every 6 (six) hours as needed. For shortness of breath   Yes Historical Provider, MD  azelastine (OPTIVAR) 0.05 % ophthalmic solution Place 1 drop into both eyes 2 (two) times daily.     Yes Historical Provider, MD  budesonide (PULMICORT) 0.5 MG/2ML nebulizer solution Take 0.5 mg by nebulization 2 (two) times daily as needed. wheezing   Yes Historical Provider, MD  calcium carbonate (TUMS - DOSED IN MG ELEMENTAL CALCIUM) 500 MG chewable tablet Chew 1 tablet by mouth daily.    Yes Historical Provider, MD  Cholecalciferol (VITAMIN D3) 2000 UNITS TABS Take 2,000 Units by mouth daily.   Yes Historical Provider, MD  Dextromethorphan-Guaifenesin (Q-TUSSIN DM) 10-100 MG/5ML liquid Take 10 mLs by mouth every 6 (six) hours as needed (for cough/ congestion).   Yes Historical Provider, MD  feeding supplement, ENSURE COMPLETE, (ENSURE COMPLETE) LIQD Take 237 mLs by mouth 2 (two) times daily between meals. 09/29/14  Yes Carly Rivet, MD  fluticasone (FLONASE) 50 MCG/ACT nasal spray Place 2 sprays into the nose 2 (two) times daily as needed. For congestion and runny nose   Yes Historical Provider, MD  magnesium hydroxide (MILK OF MAGNESIA) 400 MG/5ML suspension Take 15 mLs by mouth daily as needed for mild constipation.   Yes Historical Provider, MD  memantine (NAMENDA) 10 MG tablet Take 10 mg by mouth 2 (two) times daily.     Yes Historical Provider, MD  montelukast (SINGULAIR) 10 MG tablet Take 10 mg by mouth at bedtime.     Yes Historical Provider, MD  Multiple Vitamins-Minerals (MULTIVITAMINS THER. W/MINERALS)  TABS Take 1 tablet by mouth daily.     Yes Historical Provider, MD  omeprazole (PRILOSEC) 40 MG capsule Take 40 mg by mouth daily.   Yes Historical Provider, MD  predniSONE (DELTASONE) 5 MG tablet Take 5 mg by mouth daily.   Yes  Historical Provider, MD  predniSONE (STERAPRED UNI-PAK) 5 MG TABS tablet Take 5 mg by mouth daily.    Historical Provider, MD  STARCH-MALTO DEXTRIN (THICK-IT) POWD Take 1 application by mouth daily as needed (nectar consistency).    Historical Provider, MD   No Known Allergies  FAMILY HISTORY:  family history includes Arthritis in her other; Cancer in her other; Hypertension in her other. SOCIAL HISTORY:  reports that she has never smoked. She has never used smokeless tobacco. She reports that she does not drink alcohol or use illicit drugs.  REVIEW OF SYSTEMS:   Unable  SUBJECTIVE:  Appears comfortable   VITAL SIGNS: Temp:  [98.5 F (36.9 C)-103 F (39.4 C)] 100 F (37.8 C) (01/19 0300) Pulse Rate:  [98-147] 121 (01/19 0800) Resp:  [19-34] 27 (01/19 0800) BP: (56-164)/(29-74) 82/44 mmHg (01/19 0800) SpO2:  [92 %-100 %] 100 % (01/19 0828) Weight:  [58.968 kg (130 lb)-69.6 kg (153 lb 7 oz)] 69.6 kg (153 lb 7 oz) (01/19 0421)  PHYSICAL EXAMINATION: General:  Not in acute distress.  Neuro:  Awake, not interactive.  HEENT:  edentulous marked upper airway sounds  Cardiovascular:  Tachy irreg irreg  Lungs:  Course scattered rhonchi, no accessory muscle use  Abdomen:  Soft, non-tender + bowel sounds  Musculoskeletal:  Weak Skin:  LE edema    Recent Labs Lab 12/18/14 1113 12/19/14 0241  NA 146* 147*  K 2.9* 4.1  CL 112 121*  CO2 21 13*  BUN 10 13  CREATININE 1.49* 2.16*  GLUCOSE 92 41*    Recent Labs Lab 12/18/14 1113 12/19/14 0241  HGB 15.6* 12.9  HCT 48.8* 40.4  WBC 6.8 14.1*  PLT PLATELET CLUMPS NOTED ON SMEAR, UNABLE TO ESTIMATE 92*   Ct Head Wo Contrast  12/18/2014   CLINICAL DATA:  Altered mental status  EXAM: CT HEAD WITHOUT CONTRAST  TECHNIQUE: Contiguous axial images were obtained from the base of the skull through the vertex without intravenous contrast.  COMPARISON:  September 19, 2014  FINDINGS: There is moderate diffuse atrophy, stable. There is no  intracranial mass, hemorrhage, extra-axial fluid collection, or midline shift. There is small vessel disease throughout the centra semiovale bilaterally. There is small vessel disease with prior lacunar infarcts in the anterior limbs of the left internal and external capsules. No new gray-white compartment lesion is identified. There are several lacunar infarcts in the right external capsule. There is no acute appearing infarct. Bony calvarium appears intact. Visualized mastoid air cells are clear. There are air-fluid levels in each maxillary antrum. There is extensive ethmoid sinus disease bilaterally.  IMPRESSION: Atrophy with extensive small vessel disease. Prior lacunar infarcts in the basal ganglia regions bilaterally. No acute infarct apparent. No hemorrhage or mass effect. Paranasal sinus disease is noted at multiple sites.   Electronically Signed   By: Bretta Bang M.D.   On: 12/18/2014 12:55   Dg Chest Portable 1 View  12/18/2014   CLINICAL DATA:  Fever and congestion ; altered mental status  EXAM: PORTABLE CHEST - 1 VIEW  COMPARISON:  September 25, 2014  FINDINGS: There is focal consolidation right upper lobe adjacent to the minor fissure. There is underlying emphysema. Elsewhere lungs are clear.  Heart size and pulmonary vascularity are normal. No adenopathy. There are chronic rotator cuff tears in each shoulder. Stomach is distended with air.  IMPRESSION: Focal consolidation right upper lobe adjacent to the minor fissure. Lungs elsewhere clear. Underlying emphysema. Stomach distended with air.   Electronically Signed   By: Bretta BangWilliam  Woodruff M.D.   On: 12/18/2014 10:47    ASSESSMENT / PLAN:  Aspiration Pneumonia  Septic shock/MODS Atrial Fibrillation  Hypernatremia  NAG acidosis -->suspect that this is from her NS resuscitation and hyperchloremia  Acute renal failure  Hypoglycemia  Dementia  Deconditioning   Discussion This is a 81 year old female w/ advanced dementia and progressive  physical and cognitive decline. Had a long discussion w/ her daughter and family members at the bedside. Do not think that escalation of care is likely to yield any-more favorable results than aggressive care in this setting; especially w/ the high likelihood that she will be weaker after this acute illness and at high risk for this to become a cycle that repeats itself. We agreed based on this that we would continue current treatment but not escalate. The family understands that if she continues this current trend then we may need to look at total transition to comfort only.   Plan Cont IVFs Stress dose steroids (hypoglycemic, suspect she has some adrenal dysfunction) Cont IV abx No Further Escalation beyond this.     Simonne MartinetPeter E Babcock ACNP-BC [facility] Pager # [phone] OR # [phone] if no answer  Critical Care attending Note   12/19/2014, 10:13 AM  Reviewed above, examined.  81 yo female with hx of advanced dementia admitted with progressive confusion, fever.  Found to have PNA and hypotension.  Pt was DNR, but then there was confusion about how aggressive family would allow therapy to be.  She is back to baseline mental status >> only speaks to daughter.  She appears comfortable.  Has b/l wheezing on exam.  She is on appropriate Abx, and scheduled BDs >> will add solumedrol for asthma exacerbation in setting of PNA.    Discussed goals of care with pts daughter.  She confirmed DNR/DNI status.  Will continue supplemental oxygen, Abx, and IV fluids.  No further IV fluid boluses, and no BiPAP.  If her medical status gets worse, then transition to comfort care.  Plan was d/w Dr. Izola PriceMyers.  Will defer further management to hospitalist team.  Please call if PCCM can be of further assistance.  Coralyn HellingVineet Courteny Egler, MD [facility] 12/19/2014, 11:15 AM Pager:  [phone] After 3pm call: [phone]

## 2014-12-19 NOTE — Progress Notes (Signed)
Hypoglycemic Event  CBG:63  Treatment: D50 IV 25 mL  Symptoms: None  Follow-up CBG: Time:0805 CBG Result:134  Possible Reasons for Event: Unknown  Comments/MD notified: Orders placed for Q2 hr CBG Per MD.     Marlinda Mikeichard, Meztli Llanas J  Remember to initiate Hypoglycemia Order Set & complete

## 2014-12-19 NOTE — Progress Notes (Signed)
RT Note:  Rt NTS pt without event.  RT removed small amt of wht/clear secretions and pt threw up shortly after.  Pt sounded more congested so RT NTS again and only removed a small amt.  RT will continue to monitor.

## 2014-12-19 NOTE — Progress Notes (Signed)
This RN received report, pts CBG 61 IV dextrose given CBG 134, BP 60-80 sys, pts daughter educated by this RN of comfort care or cont care. Pt's daughter requested cont of care, MD/N

## 2014-12-19 NOTE — Progress Notes (Signed)
Patient ID: [name], female   DOB: 02-08-1929, 81 y.o.   MRN: [id number]  [facility] PROGRESS NOTE  MARYHELEN LINDLER JXB:[id number] DOB: 06/12/1929 DOA: 01-17-2015 PCP: Thane Edu, MD  Brief narrative:    Patient is 81 year old female with hypertension, moderate dementia, asthma, presented to [facility] emergency department for further evaluation of progressively worsening confusion and lethargy at home. Please note that patient is unable to provide any history of the time of the admission, daughter at bedside able to assist with details. She explains her mother has been sick for the past week, she has taken her mom to primary care physician, no medications given at that time. Patient has continued to become more confused, daughter noted fevers as high as 101 Fahrenheit, shortness of breath noted with exertion, productive cough of yellow sputum also noted for the past 24 hours. No reported abdominal concerns. Daughter also reports dark and malodorous urine. Of note, patient was recently discharged from the hospital in October 2015 after being treated for altered mental status secondary to sepsis in the setting of aspiration pneumonia.  In emergency department, patient noted to be lethargic, vital signs notable for temperature 102.2 Fahrenheit, heart rate 1:15, respiratory rate 33 breaths per minute, blood pressure 73/39. Blood work notable for hemoglobin 15.6, sodium 146, potassium 2.9, creatinine 1.49. Chest x-ray consistent with right upper lobe pneumonia. Tried hospitalist asked to admit to step down unit for further evaluation.  Assessment and Plan: Active Problems:  Severe Sepsis - Secondary to pneumonia, HCAP given recent hospitalization, UTI suggested based on symptoms and urinalysis - pt more alert this Am and looks better, still hypotensive - Continue vancomycin and Zosyn day #2 - there was some confusion about changing code to partial and allowing pressors to  be given but family again confirmed DNR status - no pressors and no interventions  - will continue small boluses for now for BP support - Keep on oxygen via [place]  Acute renal failure with metabolic acidosis  - Secondary to prerenal etiology, sepsis - Cr continues trending up despite IVF - monitor I's and O's - pharmacy to dose vanc renally   Electrolyte abnormalities - Including sodium and potassium, phosphate  - Supplemented potassium, need phos as well  - Repeat electrolyte panel in the morning  Severe deconditioning, severe malnutrition  - With progressive failure to thrive, high risk aspiration - Discussed CODE STATUS with family, family confirms DO NOT RESUSCITATE status - Family very clear with decision to keep mother comfortable, agreeable with IV fluids and antibiotics, no pressors/intuabtion as noted above  - comfort feeding as pt able to tolerate   UTI - Current antibiotics should be adequate in coverage, follow-up on urine cultures  HCAP - Broad-spectrum antibiotics as noted above   DVT prophylaxis: Lovenox subcutaneous  Code Status: DNR Family Communication:  plan of care discussed with the patient Disposition Plan: Remains inpatient   IV access:  Peripheral IV Procedures and diagnostic studies:    Ct Head Wo Contrast  17-Jan-2015   Atrophy with extensive small vessel disease. Prior lacunar infarcts in the basal ganglia regions bilaterally. No acute infarct apparent. No hemorrhage or mass effect. Paranasal sinus disease is noted at multiple sites.   Dg Chest Portable 1 View  Jan 17, 2015  Focal consolidation right upper lobe adjacent to the minor fissure. Lungs elsewhere clear. Underlying emphysema. Stomach distended with air.     Medical Consultants:  PCCM Other Consultants:  None IAnti-Infectives:   Vancomycin 01/17/2023 --> Zosyn  1/18 -->   Debbora PrestoMAGICK-Tanda Morrissey, MD  [facility] Pager [phone]  If 7PM-7AM, please contact night-coverage [email or web address] Password  [email or web address] 12/19/2014, 10:02 AM   LOS: 1 day   HPI/Subjective: No events overnight.   Objective: Filed Vitals:   12/19/14 0645 12/19/14 0700 12/19/14 0800 12/19/14 0828  BP: 77/45 93/40 82/44    Pulse: 125 126 121   Temp:      TempSrc:      Resp: 28 31 27    Height:      Weight:      SpO2: 100% 100% 100% 100%    Intake/Output Summary (Last 24 hours) at 12/19/14 1002 Last data filed at 12/19/14 0556  Gross per 24 hour  Intake 1827.5 ml  Output    260 ml  Net 1567.5 ml    Exam:   General:  Pt is alert,  not in acute distress  Cardiovascular: Regular rhythm, tachycardic, S1/S2, no murmurs, no rubs, no gallops  Respiratory: rales bilaterally  Abdomen: Soft, non tender, non distended, bowel sounds present, no guarding  Extremities: pulses DP and PT palpable bilaterally  Data Reviewed: Basic Metabolic Panel:  Recent Labs Lab 12/18/14 1113 12/18/14 1720 12/19/14 0241  NA 146*  --  147*  K 2.9*  --  4.1  CL 112  --  121*  CO2 21  --  13*  GLUCOSE 92  --  41*  BUN 10  --  13  CREATININE 1.49*  --  2.16*  CALCIUM 8.9  --  7.6*  MG  --  1.5  --   PHOS  --  1.1*  --    Liver Function Tests:  Recent Labs Lab 12/18/14 1113  AST 35  ALT 15  ALKPHOS 73  BILITOT 1.5*  PROT 5.6*  ALBUMIN 2.8*   CBC:  Recent Labs Lab 12/18/14 1113 12/19/14 0241  WBC 6.8 14.1*  NEUTROABS 5.8  --   HGB 15.6* 12.9  HCT 48.8* 40.4  MCV 90.4 89.8  PLT PLATELET CLUMPS NOTED ON SMEAR, UNABLE TO ESTIMATE 92*   CBG:  Recent Labs Lab 12/18/14 1011 12/19/14 0406 12/19/14 0536 12/19/14 0731  GLUCAP 111* 37* 61* 63*    Recent Results (from the past 240 hour(s))  Urine culture     Status: None (Preliminary result)   Collection Time: 12/18/14 10:32 AM  Result Value Ref Range Status   Specimen Description URINE, CATHETERIZED  Final   Special Requests NONE  Final   Colony Count   Final    >=100,000 COLONIES/ML Performed at [facility]    Culture   Final    GRAM  NEGATIVE RODS Performed at [facility]    Report Status PENDING  Incomplete  Blood culture (routine x 2)     Status: None (Preliminary result)   Collection Time: 12/18/14 11:15 AM  Result Value Ref Range Status   Specimen Description BLOOD LEFT HAND  Final   Special Requests BOTTLES DRAWN AEROBIC AND ANAEROBIC 5CC  Final   Culture   Final           BLOOD CULTURE RECEIVED NO GROWTH TO DATE CULTURE WILL BE HELD FOR 5 DAYS BEFORE ISSUING A FINAL NEGATIVE REPORT Performed at [facility]    Report Status PENDING  Incomplete  Blood culture (routine x 2)     Status: None (Preliminary result)   Collection Time: 12/18/14 11:25 AM  Result Value Ref Range Status   Specimen Description BLOOD HAND LEFT  Final   Special  Requests BOTTLES DRAWN AEROBIC AND ANAEROBIC 2.5CC  Final   Culture   Final           BLOOD CULTURE RECEIVED NO GROWTH TO DATE CULTURE WILL BE HELD FOR 5 DAYS BEFORE ISSUING A FINAL NEGATIVE REPORT Performed at [facility]    Report Status PENDING  Incomplete  MRSA PCR Screening     Status: None   Collection Time: 12/18/14  4:31 PM  Result Value Ref Range Status   MRSA by PCR NEGATIVE NEGATIVE Final    Comment:        The GeneXpert MRSA Assay (FDA approved for NASAL specimens only), is one component of a comprehensive MRSA colonization surveillance program. It is not intended to diagnose MRSA infection nor to guide or monitor treatment for MRSA infections.      Scheduled Meds: . albuterol  2.5 mg Nebulization TID  . enoxaparin (LOVENOX) injection  30 mg Subcutaneous Q24H  . ketotifen  1 drop Both Eyes BID  . pantoprazole  40 mg Oral Daily  . piperacillin-tazobactam (ZOSYN)  IV  3.375 g Intravenous 3 times per day  . vancomycin  750 mg Intravenous Q24H   Continuous Infusions: . dextrose 75 mL/hr at 12/19/14 [phone]

## 2014-12-19 NOTE — Progress Notes (Addendum)
Pt here with sepsis/schock PNA, likely from aspiration. Attending discussed tx plan with family upon admission who confirmed DNR/DNI/no pressors. Pt's status declining on this shift. BP soft in spite of boluses. BP dropped further into the 60s systolic. This NP called daughter, Kristen BraunKaren, who is the pt's caregiver. Informed of decline. Daughter came to hospital. NP spoke with daughter in person about pt's condition and the seriousness of her illness and that she may not survive. Daughter is aware and and wants her mother to be comfortable no matter what. Will continue IVF and abx at this point, but will not escalate care further at this point.  Update: Pt continues to do poorly. Labs worsening. Per RN, likely has Cdiff given stooling. Hypoglycemic. Changed IVF to D5W. Continue supportive care. BP continues to be low in spite of several boluses. Daughter updated. KJKG, NP

## 2014-12-19 NOTE — Progress Notes (Addendum)
   Hypoglycemic Event  CBG: 37  Treatment: spoke with NP Craige CottaKirby, ordered D5W @ 75 cc/hr  Symptoms: no change in patient status   Follow-up CBG: Time: 0536 CBG Result: 61   Possible Reasons for Event: patient not eating   Comments/MD notified: NP Craige CottaKirby notified and responded and placed orders     Ranika Mcniel, [employer] DennisonWhittney M  Remember to initiate Hypoglycemia Order Set & complete

## 2014-12-19 NOTE — Progress Notes (Signed)
Utilization Review Completed.[name] T1/19/2016  

## 2014-12-19 NOTE — Progress Notes (Signed)
1L bolus complete BP 66/38. MD notified. Will continue to monitor pt.

## 2014-12-20 LAB — CBC
HCT: 38.5 % (ref 36.0–46.0)
Hemoglobin: 13.1 g/dL (ref 12.0–15.0)
MCH: 28.5 pg (ref 26.0–34.0)
MCHC: 34 g/dL (ref 30.0–36.0)
MCV: 83.7 fL (ref 78.0–100.0)
Platelets: 63 10*3/uL — ABNORMAL LOW (ref 150–400)
RBC: 4.6 MIL/uL (ref 3.87–5.11)
RDW: 14.9 % (ref 11.5–15.5)
WBC: 28.4 10*3/uL — ABNORMAL HIGH (ref 4.0–10.5)

## 2014-12-20 LAB — GLUCOSE, CAPILLARY
GLUCOSE-CAPILLARY: 192 mg/dL — AB (ref 70–99)
GLUCOSE-CAPILLARY: 136 mg/dL — AB (ref 70–99)
GLUCOSE-CAPILLARY: 131 mg/dL — AB (ref 70–99)
Glucose-Capillary: 176 mg/dL — ABNORMAL HIGH (ref 70–99)
Glucose-Capillary: 158 mg/dL — ABNORMAL HIGH (ref 70–99)
Glucose-Capillary: 156 mg/dL — ABNORMAL HIGH (ref 70–99)

## 2014-12-20 LAB — BASIC METABOLIC PANEL
ANION GAP: 5 (ref 5–15)
BUN: 21 mg/dL (ref 6–23)
CALCIUM: 8 mg/dL — AB (ref 8.4–10.5)
CHLORIDE: 119 meq/L — AB (ref 96–112)
CO2: 16 mmol/L — ABNORMAL LOW (ref 19–32)
Creatinine, Ser: 2.5 mg/dL — ABNORMAL HIGH (ref 0.50–1.10)
GFR calc non Af Amer: 17 mL/min — ABNORMAL LOW (ref >90)
GFR, EST AFRICAN AMERICAN: 19 mL/min — AB (ref >90)
Glucose, Bld: 184 mg/dL — ABNORMAL HIGH (ref 70–99)
Potassium: 3.8 mmol/L (ref 3.5–5.1)
Sodium: 140 mmol/L (ref 135–145)

## 2014-12-20 LAB — PROCALCITONIN: PROCALCITONIN: 58.46 ng/mL

## 2014-12-20 MED ORDER — SODIUM CHLORIDE 0.9 % IV SOLN
INTRAVENOUS | Status: DC
  Administered 2014-12-20: 11:00:00 via INTRAVENOUS

## 2014-12-20 NOTE — Progress Notes (Signed)
[facility] PROGRESS NOTE  Assessment/Plan: Severe sepsis due to HCAP: - cont vanc and zosyn started on admisison - PCCM confirmed DNR/DNI. appreciate assistance. - BP has improved, tachycardia resolved. - afebrile. leukocytosis worsend ? Due to steroids.  Acute renal failure with metabolic acidosis: - Secondary to prerenal etiology, sepsis - Worsening renal function and acidosis, recheck b-met in am. - Change IV fluids to NS infusion.  - BP has improved.  Electrolyte abnormalities - Repeat electrolyte panel in the morning. - sodium improved. - Chloride improved.  Severe deconditioning, severe malnutrition  - With progressive failure to thrive, high risk aspiration - Discussed DNR/DNI. - Family very clear with decision to keep mother comfortable, agreeable with IV fluids and antibiotics, no pressors/intuabtion as noted above  - comfort feeding as pt able to tolerate      Code Status: DNR/DNI Family Communication: none Disposition Plan: inpatient   Consultants:  PCCM  Procedures:  none  Antibiotics:  vanc and zosyn  HPI/Subjective: Non verbal.  Objective: Filed Vitals:   12/20/14 0422 12/20/14 0500 12/20/14 0728 12/20/14 0745  BP:  101/69  125/51  Pulse:  96  95  Temp: 98.2 F (36.8 C)   98.5 F (36.9 C)  TempSrc: Oral   Oral  Resp:  17  21  Height:      Weight:  68.6 kg (151 lb 3.8 oz)    SpO2:  98% 100% 99%    Intake/Output Summary (Last 24 hours) at 12/20/14 0926 Last data filed at 12/20/14 0800  Gross per 24 hour  Intake   2570 ml  Output    515 ml  Net   2055 ml   Filed Weights   12/18/14 1635 12/19/14 0421 12/20/14 0500  Weight: 67 kg (147 lb 11.3 oz) 69.6 kg (153 lb 7 oz) 68.6 kg (151 lb 3.8 oz)    Exam:  General: in no acute distress.  HEENT: No bruits, no goiter. -JVD Heart: Regular rate and rhythm. Lungs: Good air movement, clear Abdomen: Soft, nontender, nondistended, positive bowel sounds.     Data  Reviewed: Basic Metabolic Panel:  Recent Labs Lab 12/18/14 1113 12/18/14 1720 12/19/14 0241 12/20/14 0250  NA 146*  --  147* 140  K 2.9*  --  4.1 3.8  CL 112  --  121* 119*  CO2 21  --  13* 16*  GLUCOSE 92  --  41* 184*  BUN 10  --  13 21  CREATININE 1.49*  --  2.16* 2.50*  CALCIUM 8.9  --  7.6* 8.0*  MG  --  1.5  --   --   PHOS  --  1.1*  --   --    Liver Function Tests:  Recent Labs Lab 12/18/14 1113  AST 35  ALT 15  ALKPHOS 73  BILITOT 1.5*  PROT 5.6*  ALBUMIN 2.8*   No results for input(s): LIPASE, AMYLASE in the last 168 hours. No results for input(s): AMMONIA in the last 168 hours. CBC:  Recent Labs Lab 12/18/14 1113 12/19/14 0241 12/20/14 0250  WBC 6.8 14.1* 28.4*  NEUTROABS 5.8  --   --   HGB 15.6* 12.9 13.1  HCT 48.8* 40.4 38.5  MCV 90.4 89.8 83.7  PLT PLATELET CLUMPS NOTED ON SMEAR, UNABLE TO ESTIMATE 92* 63*   Cardiac Enzymes: No results for input(s): CKTOTAL, CKMB, CKMBINDEX, TROPONINI in the last 168 hours. BNP (last 3 results)  Recent Labs  09/19/14 1651  PROBNP 6958.0*   CBG:  Recent Labs Lab 12/19/14 1534 12/19/14 1931 12/19/14 2314 12/20/14 0419 12/20/14 0743  GLUCAP 100* 122* 155* 176* 158*    Recent Results (from the past 240 hour(s))  Urine culture     Status: None (Preliminary result)   Collection Time: 12/18/14 10:32 AM  Result Value Ref Range Status   Specimen Description URINE, CATHETERIZED  Final   Special Requests NONE  Final   Colony Count   Final    >=100,000 COLONIES/ML Performed at [facility]    Culture   Final    Wynot Performed at [facility]    Report Status PENDING  Incomplete  Blood culture (routine x 2)     Status: None (Preliminary result)   Collection Time: 12/18/14 11:15 AM  Result Value Ref Range Status   Specimen Description BLOOD LEFT HAND  Final   Special Requests BOTTLES DRAWN AEROBIC AND ANAEROBIC 5CC  Final   Culture   Final           BLOOD CULTURE  RECEIVED NO GROWTH TO DATE CULTURE WILL BE HELD FOR 5 DAYS BEFORE ISSUING A FINAL NEGATIVE REPORT Performed at [facility]    Report Status PENDING  Incomplete  Blood culture (routine x 2)     Status: None (Preliminary result)   Collection Time: 12/18/14 11:25 AM  Result Value Ref Range Status   Specimen Description BLOOD HAND LEFT  Final   Special Requests BOTTLES DRAWN AEROBIC AND ANAEROBIC 2.5CC  Final   Culture   Final           BLOOD CULTURE RECEIVED NO GROWTH TO DATE CULTURE WILL BE HELD FOR 5 DAYS BEFORE ISSUING A FINAL NEGATIVE REPORT Performed at [facility]    Report Status PENDING  Incomplete  MRSA PCR Screening     Status: None   Collection Time: 12/18/14  4:31 PM  Result Value Ref Range Status   MRSA by PCR NEGATIVE NEGATIVE Final    Comment:        The GeneXpert MRSA Assay (FDA approved for NASAL specimens only), is one component of a comprehensive MRSA colonization surveillance program. It is not intended to diagnose MRSA infection nor to guide or monitor treatment for MRSA infections.   Clostridium Difficile by PCR     Status: None   Collection Time: 12/18/14 11:51 PM  Result Value Ref Range Status   C difficile by pcr NEGATIVE NEGATIVE Final     Studies: Ct Head Wo Contrast  12/18/2014   CLINICAL DATA:  Altered mental status  EXAM: CT HEAD WITHOUT CONTRAST  TECHNIQUE: Contiguous axial images were obtained from the base of the skull through the vertex without intravenous contrast.  COMPARISON:  September 19, 2014  FINDINGS: There is moderate diffuse atrophy, stable. There is no intracranial mass, hemorrhage, extra-axial fluid collection, or midline shift. There is small vessel disease throughout the centra semiovale bilaterally. There is small vessel disease with prior lacunar infarcts in the anterior limbs of the left internal and external capsules. No new gray-white compartment lesion is identified. There are several lacunar infarcts in the right  external capsule. There is no acute appearing infarct. Bony calvarium appears intact. Visualized mastoid air cells are clear. There are air-fluid levels in each maxillary antrum. There is extensive ethmoid sinus disease bilaterally.  IMPRESSION: Atrophy with extensive small vessel disease. Prior lacunar infarcts in the basal ganglia regions bilaterally. No acute infarct apparent. No hemorrhage or mass effect. Paranasal sinus disease is  noted at multiple sites.   Electronically Signed   By: Lowella Grip M.D.   On: 12/18/2014 12:55   Dg Chest Portable 1 View  12/18/2014   CLINICAL DATA:  Fever and congestion ; altered mental status  EXAM: PORTABLE CHEST - 1 VIEW  COMPARISON:  September 25, 2014  FINDINGS: There is focal consolidation right upper lobe adjacent to the minor fissure. There is underlying emphysema. Elsewhere lungs are clear. Heart size and pulmonary vascularity are normal. No adenopathy. There are chronic rotator cuff tears in each shoulder. Stomach is distended with air.  IMPRESSION: Focal consolidation right upper lobe adjacent to the minor fissure. Lungs elsewhere clear. Underlying emphysema. Stomach distended with air.   Electronically Signed   By: Lowella Grip M.D.   On: 12/18/2014 10:47    Scheduled Meds: . albuterol  2.5 mg Nebulization TID  . enoxaparin (LOVENOX) injection  30 mg Subcutaneous Q24H  . ketotifen  1 drop Both Eyes BID  . methylPREDNISolone (SOLU-MEDROL) injection  20 mg Intravenous 3 times per day  . pantoprazole  40 mg Oral Daily  . piperacillin-tazobactam (ZOSYN)  IV  2.25 g Intravenous 3 times per day  . vancomycin  1,000 mg Intravenous Q48H   Continuous Infusions: . dextrose 75 mL/hr at 12/20/14 0548     Charlynne Cousins  [facility] Pager [phone]. If 8PM-8AM, please contact night-coverage at [email or web address], password [facility] 12/20/2014, 9:26 AM  LOS: 2 days

## 2014-12-21 LAB — URINE CULTURE: Colony Count: >=100000

## 2014-12-21 LAB — BASIC METABOLIC PANEL
ANION GAP: 11 (ref 5–15)
BUN: 23 mg/dL (ref 6–23)
CO2: 15 mmol/L — ABNORMAL LOW (ref 19–32)
Calcium: 8 mg/dL — ABNORMAL LOW (ref 8.4–10.5)
Chloride: 114 mEq/L — ABNORMAL HIGH (ref 96–112)
Creatinine, Ser: 1.84 mg/dL — ABNORMAL HIGH (ref 0.50–1.10)
GFR, EST AFRICAN AMERICAN: 28 mL/min — AB (ref >90)
GFR, EST NON AFRICAN AMERICAN: 24 mL/min — AB (ref >90)
Glucose, Bld: 128 mg/dL — ABNORMAL HIGH (ref 70–99)
POTASSIUM: 4 mmol/L (ref 3.5–5.1)
SODIUM: 140 mmol/L (ref 135–145)

## 2014-12-21 LAB — GLUCOSE, CAPILLARY
GLUCOSE-CAPILLARY: 132 mg/dL — AB (ref 70–99)
GLUCOSE-CAPILLARY: 147 mg/dL — AB (ref 70–99)
Glucose-Capillary: 134 mg/dL — ABNORMAL HIGH (ref 70–99)
Glucose-Capillary: 116 mg/dL — ABNORMAL HIGH (ref 70–99)
Glucose-Capillary: 139 mg/dL — ABNORMAL HIGH (ref 70–99)
Glucose-Capillary: 134 mg/dL — ABNORMAL HIGH (ref 70–99)

## 2014-12-21 LAB — CBC
HEMATOCRIT: 37.7 % (ref 36.0–46.0)
Hemoglobin: 13.1 g/dL (ref 12.0–15.0)
MCH: 28.1 pg (ref 26.0–34.0)
MCHC: 34.7 g/dL (ref 30.0–36.0)
MCV: 80.9 fL (ref 78.0–100.0)
Platelets: 73 10*3/uL — ABNORMAL LOW (ref 150–400)
RBC: 4.66 MIL/uL (ref 3.87–5.11)
RDW: 14.4 % (ref 11.5–15.5)
WBC: 20.4 10*3/uL — ABNORMAL HIGH (ref 4.0–10.5)

## 2014-12-21 MED ORDER — HYDRALAZINE HCL 20 MG/ML IJ SOLN
5.0000 mg | Freq: Four times a day (QID) | INTRAMUSCULAR | Status: DC | PRN
  Administered 2014-12-21 (×2): 5 mg via INTRAVENOUS
  Filled 2014-12-21 (×3): qty 1

## 2014-12-21 MED ORDER — PREDNISONE 5 MG PO TABS
5.0000 mg | ORAL_TABLET | Freq: Every day | ORAL | Status: DC
  Administered 2014-12-21 – 2014-12-24 (×4): 5 mg via ORAL
  Filled 2014-12-21 (×4): qty 1

## 2014-12-21 MED ORDER — ALBUTEROL SULFATE (2.5 MG/3ML) 0.083% IN NEBU
2.5000 mg | INHALATION_SOLUTION | Freq: Four times a day (QID) | RESPIRATORY_TRACT | Status: DC | PRN
  Filled 2014-12-21: qty 3

## 2014-12-21 MED ORDER — DEXTROMETHORPHAN-GUAIFENESIN 10-100 MG/5ML PO SYRP
10.0000 mL | ORAL_SOLUTION | Freq: Four times a day (QID) | ORAL | Status: DC | PRN
  Filled 2014-12-21: qty 10

## 2014-12-21 MED ORDER — MEMANTINE HCL 10 MG PO TABS
10.0000 mg | ORAL_TABLET | Freq: Two times a day (BID) | ORAL | Status: DC
  Administered 2014-12-21 – 2014-12-24 (×6): 10 mg via ORAL
  Filled 2014-12-21 (×8): qty 1

## 2014-12-21 NOTE — Progress Notes (Signed)
[facility] PROGRESS NOTE  Assessment/Plan: Severe sepsis due to HCAP: - Cont vanc and zosyn started on admisison - PCCM confirmed DNR/DNI. appreciate assistance. - Afebrile. leukocytosis worsend ? Due to steroids. CBC pending.  Acute renal failure with metabolic acidosis: - Secondary to prerenal etiology, sepsis - B-met is pending. - KVO IV fluids. - BP has improved.  Electrolyte abnormalities - sodium improved. - Chloride improved. B-met pending this am.  Severe deconditioning, severe malnutrition  - With progressive failure to thrive, high risk aspiration - Discussed DNR/DNI. - Family very clear with decision to keep mother comfortable, agreeable with IV fluids and antibiotics, no pressors/intuabtion as noted above  - comfort feeding as pt able to tolerate      Code Status: DNR/DNI Family Communication: none Disposition Plan: inpatient   Consultants:  PCCM  Procedures:  none  Antibiotics:  vanc and zosyn  HPI/Subjective: Non verbal.  Objective: Filed Vitals:   12/20/14 2314 12/21/14 0359 12/21/14 0500 12/21/14 0740  BP: 143/88 141/94  140/79  Pulse: 68 68  86  Temp: 97.7 F (36.5 C) 98.1 F (36.7 C)  98.4 F (36.9 C)  TempSrc: Axillary Axillary  Axillary  Resp: 19 21  26   Height:      Weight:   71.1 kg (156 lb 12 oz)   SpO2: 99% 98%      Intake/Output Summary (Last 24 hours) at 12/21/14 0826 Last data filed at 12/21/14 0626  Gross per 24 hour  Intake 1868.75 ml  Output    575 ml  Net 1293.75 ml   Filed Weights   12/19/14 0421 12/20/14 0500 12/21/14 0500  Weight: 69.6 kg (153 lb 7 oz) 68.6 kg (151 lb 3.8 oz) 71.1 kg (156 lb 12 oz)    Exam:  General: in no acute distress.  HEENT: No bruits, no goiter. -JVD Heart: Regular rate and rhythm. Lungs: Good air movement, clear Abdomen: Soft, nontender, nondistended, positive bowel sounds.     Data Reviewed: Basic Metabolic Panel:  Recent Labs Lab 12/18/14 1113  12/18/14 1720 12/19/14 0241 12/20/14 0250  NA 146*  --  147* 140  K 2.9*  --  4.1 3.8  CL 112  --  121* 119*  CO2 21  --  13* 16*  GLUCOSE 92  --  41* 184*  BUN 10  --  13 21  CREATININE 1.49*  --  2.16* 2.50*  CALCIUM 8.9  --  7.6* 8.0*  MG  --  1.5  --   --   PHOS  --  1.1*  --   --    Liver Function Tests:  Recent Labs Lab 12/18/14 1113  AST 35  ALT 15  ALKPHOS 73  BILITOT 1.5*  PROT 5.6*  ALBUMIN 2.8*   No results for input(s): LIPASE, AMYLASE in the last 168 hours. No results for input(s): AMMONIA in the last 168 hours. CBC:  Recent Labs Lab 12/18/14 1113 12/19/14 0241 12/20/14 0250  WBC 6.8 14.1* 28.4*  NEUTROABS 5.8  --   --   HGB 15.6* 12.9 13.1  HCT 48.8* 40.4 38.5  MCV 90.4 89.8 83.7  PLT PLATELET CLUMPS NOTED ON SMEAR, UNABLE TO ESTIMATE 92* 63*   Cardiac Enzymes: No results for input(s): CKTOTAL, CKMB, CKMBINDEX, TROPONINI in the last 168 hours. BNP (last 3 results)  Recent Labs  09/19/14 1651  PROBNP 6958.0*   CBG:  Recent Labs Lab 12/20/14 1521 12/20/14 1935 12/20/14 2313 12/21/14 0359 12/21/14 0750  GLUCAP 156* 136* 131*  116* 132*    Recent Results (from the past 240 hour(s))  Urine culture     Status: None (Preliminary result)   Collection Time: 12/18/14 10:32 AM  Result Value Ref Range Status   Specimen Description URINE, CATHETERIZED  Final   Special Requests NONE  Final   Colony Count   Final    >=100,000 COLONIES/ML Performed at [facility]    Culture   Final    KLEBSIELLA PNEUMONIAE Performed at [facility]    Report Status PENDING  Incomplete   Organism ID, Bacteria KLEBSIELLA PNEUMONIAE  Final      Susceptibility   Klebsiella pneumoniae - MIC*    AMPICILLIN >=32 RESISTANT Resistant     CEFAZOLIN >=64 RESISTANT Resistant     CIPROFLOXACIN 0.5 SENSITIVE Sensitive     GENTAMICIN >=16 RESISTANT Resistant     LEVOFLOXACIN 1 SENSITIVE Sensitive     NITROFURANTOIN 128 RESISTANT Resistant      TOBRAMYCIN 8 INTERMEDIATE Intermediate     TRIMETH/SULFA >=320 RESISTANT Resistant     PIP/TAZO <=4 SENSITIVE Sensitive     * KLEBSIELLA PNEUMONIAE  Blood culture (routine x 2)     Status: None (Preliminary result)   Collection Time: 12/18/14 11:15 AM  Result Value Ref Range Status   Specimen Description BLOOD LEFT HAND  Final   Special Requests BOTTLES DRAWN AEROBIC AND ANAEROBIC 5CC  Final   Culture   Final           BLOOD CULTURE RECEIVED NO GROWTH TO DATE CULTURE WILL BE HELD FOR 5 DAYS BEFORE ISSUING A FINAL NEGATIVE REPORT Performed at [facility]    Report Status PENDING  Incomplete  Blood culture (routine x 2)     Status: None (Preliminary result)   Collection Time: 12/18/14 11:25 AM  Result Value Ref Range Status   Specimen Description BLOOD HAND LEFT  Final   Special Requests BOTTLES DRAWN AEROBIC AND ANAEROBIC 2.5CC  Final   Culture   Final           BLOOD CULTURE RECEIVED NO GROWTH TO DATE CULTURE WILL BE HELD FOR 5 DAYS BEFORE ISSUING A FINAL NEGATIVE REPORT Performed at [facility]    Report Status PENDING  Incomplete  MRSA PCR Screening     Status: None   Collection Time: 12/18/14  4:31 PM  Result Value Ref Range Status   MRSA by PCR NEGATIVE NEGATIVE Final    Comment:        The GeneXpert MRSA Assay (FDA approved for NASAL specimens only), is one component of a comprehensive MRSA colonization surveillance program. It is not intended to diagnose MRSA infection nor to guide or monitor treatment for MRSA infections.   Clostridium Difficile by PCR     Status: None   Collection Time: 12/18/14 11:51 PM  Result Value Ref Range Status   C difficile by pcr NEGATIVE NEGATIVE Final     Studies: No results found.  Scheduled Meds: . albuterol  2.5 mg Nebulization TID  . enoxaparin (LOVENOX) injection  30 mg Subcutaneous Q24H  . ketotifen  1 drop Both Eyes BID  . methylPREDNISolone (SOLU-MEDROL) injection  20 mg Intravenous 3 times per day  .  pantoprazole  40 mg Oral Daily  . piperacillin-tazobactam (ZOSYN)  IV  2.25 g Intravenous 3 times per day  . vancomycin  1,000 mg Intravenous Q48H   Continuous Infusions:     Charlynne Cousins  [facility] Pager [phone]. If 8PM-8AM,  please contact night-coverage at [email or web address], password [facility] 12/21/2014, 8:26 AM  LOS: 3 days

## 2014-12-21 NOTE — Progress Notes (Signed)
NURSING PROGRESS NOTE  [name] [id number] Transfer Data: 12/21/2014 1:33 PM Attending Provider: Marinda ElkAbraham Feliz Ortiz, MD YQM:[id number]:KOEHLER,ROBERT Janyth PupaNICHOLAS, MD Code Status: DNR   [name] is a 81 y.o. female patient transferred from 3S  -No acute distress noted.  -No complaints of shortness of breath.  -No complaints of chest pain.   Last Documented Vital Signs: Blood pressure 167/72, pulse 88, temperature 99.1 F (37.3 C), temperature source Oral, resp. rate 22, height 4\' 9"  (1.448 m), weight 71.1 kg (156 lb 12 oz), SpO2 100 %.  IV Fluids:  IV in place, occlusive dsg intact without redness, IV cath hand left, condition patent and no redness none.   Allergies:  Review of patient's allergies indicates no known allergies.  Past Medical History:   has a past medical history of Asthma; Hypertension; Osteoporosis; GERD (gastroesophageal reflux disease); Pulmonary embolism; Leg edema; DVT (deep venous thrombosis); Pneumonia (?X1); Asthmatic bronchitis; Arthritis; Kidney stones; Frequent UTI; and Dementia.  Past Surgical History:   has past surgical history that includes Cholecystectomy; Lithotripsy; Knee arthroscopy (Right); Hip fracture surgery (Left); Fracture surgery; Carpal tunnel release (Right); and Dilation and curettage of uterus.  Social History:   reports that she has never smoked. She has never used smokeless tobacco. She reports that she does not drink alcohol or use illicit drugs.  Skin: intact  Patient/Family orientated to room. Information packet given to patient/family. Admission inpatient armband information verified with patient/family to include name and date of birth and placed on patient arm. Side rails up x 2, fall assessment and education completed with patient/family. Patient/family able to verbalize understanding of risk associated with falls and verbalized understanding to call for assistance before getting out of bed. Call light within reach. Patient/family  able to voice and demonstrate understanding of unit orientation instructions.

## 2014-12-21 NOTE — Progress Notes (Signed)
Received call from lab that pt's urine culture is positive for ESBL. Text paged David StallFeliz Ortiz, MD. Waiting for call back. Placed pt on contact isolation.

## 2014-12-21 NOTE — Progress Notes (Signed)
[facility] stopped by to see Mrs [name]. They were involved in her care prior to being admitted to hospital. Gave their phone number to notify them of any changes with patient: [phone]

## 2014-12-21 NOTE — Progress Notes (Signed)
ANTIBIOTIC CONSULT NOTE - FOLLOW UP  Pharmacy Consult for Vancomycin and Zosyn Indication: HCAP + Klebsiella UTI   No Known Allergies  Patient Measurements: Height:  (144.8 cm) Weight: 156 lb 12 oz (71.1 kg) IBW/kg (Calculated) : 38.6  Vital Signs: Temp: 98.4 F (36.9 C) (01/21 0740) Temp Source: Axillary (01/21 0740) BP: 140/79 mmHg (01/21 0740) Pulse Rate: 86 (01/21 0740) Intake/Output from previous day: 01/20 0701 - 01/21 0700 In: 2018.8 [P.O.:120; I.V.:1798.8; IV Piggyback:100] Out: 640 [Urine:640] Intake/Output from this shift: Total I/O In: 113.8 [I.V.:113.8] Out: -   Labs:  Recent Labs  12/19/14 0241 12/20/14 0250 12/21/14 0900  WBC 14.1* 28.4* 20.4*  HGB 12.9 13.1 13.1  PLT 92* 63* 73*  CREATININE 2.16* 2.50* 1.84*   Estimated Creatinine Clearance: 18.9 mL/min (by C-G formula based on Cr of 1.84). No results for input(s): VANCOTROUGH, VANCOPEAK, VANCORANDOM, GENTTROUGH, GENTPEAK, GENTRANDOM, TOBRATROUGH, TOBRAPEAK, TOBRARND, AMIKACINPEAK, AMIKACINTROU, AMIKACIN in the last 72 hours.   Microbiology: Recent Results (from the past 720 hour(s))  Urine culture     Status: None   Collection Time: 12/18/14 10:32 AM  Result Value Ref Range Status   Specimen Description URINE, CATHETERIZED  Final   Special Requests NONE  Final   Colony Count   Final    >=100,000 COLONIES/ML Performed at [facility]    Culture   Final    KLEBSIELLA PNEUMONIAE Note: Confirmed Extended Spectrum Beta-Lactamase Producer (ESBL) CRITICAL RESULT CALLED TO, READ BACK BY AND VERIFIED WITH: KERA A. AT 9:40AM ON 12/21/2014 HAJAM Performed at [facility]    Report Status 12/21/2014 FINAL  Final   Organism ID, Bacteria KLEBSIELLA PNEUMONIAE  Final      Susceptibility   Klebsiella pneumoniae - MIC*    AMPICILLIN >=32 RESISTANT Resistant     CEFAZOLIN >=64 RESISTANT Resistant     CIPROFLOXACIN 0.5 SENSITIVE Sensitive     GENTAMICIN >=16 RESISTANT Resistant    LEVOFLOXACIN 1 SENSITIVE Sensitive     NITROFURANTOIN 128 RESISTANT Resistant     TOBRAMYCIN 8 INTERMEDIATE Intermediate     TRIMETH/SULFA >=320 RESISTANT Resistant     PIP/TAZO <=4 SENSITIVE Sensitive     IMIPENEM <=0.25 SENSITIVE Sensitive     * KLEBSIELLA PNEUMONIAE  Blood culture (routine x 2)     Status: None (Preliminary result)   Collection Time: 12/18/14 11:15 AM  Result Value Ref Range Status   Specimen Description BLOOD LEFT HAND  Final   Special Requests BOTTLES DRAWN AEROBIC AND ANAEROBIC 5CC  Final   Culture   Final           BLOOD CULTURE RECEIVED NO GROWTH TO DATE CULTURE WILL BE HELD FOR 5 DAYS BEFORE ISSUING A FINAL NEGATIVE REPORT Performed at [facility]    Report Status PENDING  Incomplete  Blood culture (routine x 2)     Status: None (Preliminary result)   Collection Time: 12/18/14 11:25 AM  Result Value Ref Range Status   Specimen Description BLOOD HAND LEFT  Final   Special Requests BOTTLES DRAWN AEROBIC AND ANAEROBIC 2.5CC  Final   Culture   Final           BLOOD CULTURE RECEIVED NO GROWTH TO DATE CULTURE WILL BE HELD FOR 5 DAYS BEFORE ISSUING A FINAL NEGATIVE REPORT Performed at [facility]    Report Status PENDING  Incomplete  MRSA PCR Screening     Status: None   Collection Time: 12/18/14  4:31 PM  Result Value  Ref Range Status   MRSA by PCR NEGATIVE NEGATIVE Final    Comment:        The GeneXpert MRSA Assay (FDA approved for NASAL specimens only), is one component of a comprehensive MRSA colonization surveillance program. It is not intended to diagnose MRSA infection nor to guide or monitor treatment for MRSA infections.   Clostridium Difficile by PCR     Status: None   Collection Time: 12/18/14 11:51 PM  Result Value Ref Range Status   C difficile by pcr NEGATIVE NEGATIVE Final    Medical History: Past Medical History  Diagnosis Date  . Asthma   . Hypertension   . Osteoporosis   . GERD (gastroesophageal reflux  disease)   . Pulmonary embolism     hx of 4/09, sees Dr. [name]   . Leg edema   . DVT (deep venous thrombosis)     "?just one side"  . Pneumonia ?X1  . Asthmatic bronchitis   . Arthritis     "back mostly" (09/19/2014)  . Kidney stones   . Frequent UTI     "when she was younger" (09/19/2014)  . Dementia     "moderate to [place]; she talks to [place] in the family" (09/19/2014)    Medications:  Prescriptions prior to admission  Medication Sig Dispense Refill Last Dose  . acetaminophen (TYLENOL) 500 MG tablet Take 1,000 mg by mouth 2 (two) times daily.    12/17/2014 at Unknown time  . albuterol (PROVENTIL) (2.5 MG/3ML) 0.083% nebulizer solution Take 2.5 mg by nebulization every 6 (six) hours as needed. For shortness of breath   12/18/2014 at Unknown time  . azelastine (OPTIVAR) 0.05 % ophthalmic solution Place 1 drop into both eyes 2 (two) times daily.     12/17/2014 at Unknown time  . budesonide (PULMICORT) 0.5 MG/2ML nebulizer solution Take 0.5 mg by nebulization 2 (two) times daily as needed. wheezing   Past Month at Unknown time  . calcium carbonate (TUMS - DOSED IN MG ELEMENTAL CALCIUM) 500 MG chewable tablet Chew 1 tablet by mouth daily.    12/17/2014 at Unknown time  . Cholecalciferol (VITAMIN D3) 2000 UNITS TABS Take 2,000 Units by mouth daily.   12/17/2014 at Unknown time  . Dextromethorphan-Guaifenesin (Q-TUSSIN DM) 10-100 MG/5ML liquid Take 10 mLs by mouth every 6 (six) hours as needed (for cough/ congestion).   Past Week at Unknown time  . feeding supplement, ENSURE COMPLETE, (ENSURE COMPLETE) LIQD Take 237 mLs by mouth 2 (two) times daily between meals. 10 Bottle 2 12/17/2014 at Unknown time  . fluticasone (FLONASE) 50 MCG/ACT nasal spray Place 2 sprays into the nose 2 (two) times daily as needed. For congestion and runny nose   12/17/2014 at Unknown time  . magnesium hydroxide (MILK OF MAGNESIA) 400 MG/5ML suspension Take 15 mLs by mouth daily as needed for mild constipation.   Past Week at  Unknown time  . memantine (NAMENDA) 10 MG tablet Take 10 mg by mouth 2 (two) times daily.     12/17/2014 at Unknown time  . montelukast (SINGULAIR) 10 MG tablet Take 10 mg by mouth at bedtime.     12/17/2014 at Unknown time  . Multiple Vitamins-Minerals (MULTIVITAMINS THER. W/MINERALS) TABS Take 1 tablet by mouth daily.     12/17/2014 at Unknown time  . omeprazole (PRILOSEC) 40 MG capsule Take 40 mg by mouth daily.   12/17/2014 at Unknown time  . predniSONE (DELTASONE) 5 MG tablet Take 5 mg by mouth daily.  12/17/2014 at Unknown time  . predniSONE (STERAPRED UNI-PAK) 5 MG TABS tablet Take 5 mg by mouth daily.   Not Taking at Unknown time  . STARCH-MALTO DEXTRIN (THICK-IT) POWD Take 1 application by mouth daily as needed (nectar consistency).   Not Taking at Unknown time   Scheduled:  . albuterol  2.5 mg Nebulization TID  . enoxaparin (LOVENOX) injection  30 mg Subcutaneous Q24H  . ketotifen  1 drop Both Eyes BID  . methylPREDNISolone (SOLU-MEDROL) injection  20 mg Intravenous 3 times per day  . pantoprazole  40 mg Oral Daily  . piperacillin-tazobactam (ZOSYN)  IV  2.25 g Intravenous 3 times per day  . vancomycin  1,000 mg Intravenous Q48H   Assessment:  85 YOF presented to [facility] on 12/18/14 c/o altered mental status.  Patient was previously hospitalized in October for septic shock 2/2 to UTI and aspriation pneumonia and AKI.  Patient was discharged on 10 day course of levaquin 750 mg daily.    Currently on Vanco/Zosyn for HCAP + UTI. UCx grew out Klebsiella S to Zosyn, Ciprofloxacin and Levofloxacin. Now afebrile, WBC down to 20.4 (on solumedrol). CrCl ~ 18 mL/min. Procacitonin 29>>49>>58.46   1/18 Vanco>> 1/18 Zosyn>>  1/18: UCx: Klebsiella (S to cipro, levo, zosyn)  1/19 BC x 2>>ngtd  1/18: Cdiff>>negative  Goal of Therapy:  Vancomycin trough level 15-20 mcg/ml  Plan:  - Continue Vancomcyin 1 gm IV Q 48 hours  - Zosyn 2.25 g IV Q8H - Monitor for clinical efficacy and trough levels  when appropriate - [facility]   Vinnie LevelBenjamin Herby Amick, PharmD., BCPS Clinical Pharmacist Pager [phone]

## 2014-12-21 NOTE — Progress Notes (Signed)
Pt's BP 170/90s, Map 110s. Notified David StallFeliz Ortiz, MD. Placed order for prn hydralazine. Will give and continue to monitor.

## 2014-12-21 NOTE — Progress Notes (Signed)
Report called to [place]rien[place], RN on [facility]. Pt is alert and nonverbal at baseline, prn hydralazine given for hypertension, otherwise VSS, no signs of pain, all due meds given. CCMD and Elink notified of transfer, monitor removed. Daughter at bedside. Pt transferred to 5W24.

## 2014-12-22 DIAGNOSIS — N390 Urinary tract infection, site not specified: Secondary | ICD-10-CM

## 2014-12-22 LAB — BASIC METABOLIC PANEL
ANION GAP: 6 (ref 5–15)
BUN: 28 mg/dL — ABNORMAL HIGH (ref 6–23)
CO2: 24 mmol/L (ref 19–32)
Calcium: 8.2 mg/dL — ABNORMAL LOW (ref 8.4–10.5)
Chloride: 111 mEq/L (ref 96–112)
Creatinine, Ser: 1.62 mg/dL — ABNORMAL HIGH (ref 0.50–1.10)
GFR calc Af Amer: 33 mL/min — ABNORMAL LOW (ref >90)
GFR calc non Af Amer: 28 mL/min — ABNORMAL LOW (ref >90)
Glucose, Bld: 111 mg/dL — ABNORMAL HIGH (ref 70–99)
POTASSIUM: 3.2 mmol/L — AB (ref 3.5–5.1)
SODIUM: 141 mmol/L (ref 135–145)

## 2014-12-22 LAB — GLUCOSE, CAPILLARY
GLUCOSE-CAPILLARY: 85 mg/dL (ref 70–99)
Glucose-Capillary: 127 mg/dL — ABNORMAL HIGH (ref 70–99)
Glucose-Capillary: 121 mg/dL — ABNORMAL HIGH (ref 70–99)
Glucose-Capillary: 76 mg/dL (ref 70–99)
Glucose-Capillary: 69 mg/dL — ABNORMAL LOW (ref 70–99)
Glucose-Capillary: 81 mg/dL (ref 70–99)
Glucose-Capillary: 95 mg/dL (ref 70–99)

## 2014-12-22 LAB — CULTURE, BLOOD (ROUTINE X 2)

## 2014-12-22 MED ORDER — SODIUM CHLORIDE 0.9 % IJ SOLN
10.0000 mL | INTRAMUSCULAR | Status: DC | PRN
  Administered 2014-12-23: 10 mL
  Filled 2014-12-22: qty 40

## 2014-12-22 MED ORDER — CIPROFLOXACIN HCL 250 MG PO TABS
250.0000 mg | ORAL_TABLET | Freq: Two times a day (BID) | ORAL | Status: DC
  Administered 2014-12-22 – 2014-12-24 (×4): 250 mg via ORAL
  Filled 2014-12-22 (×6): qty 1

## 2014-12-22 MED ORDER — GLUCOSE 40 % PO GEL
ORAL | Status: AC
Start: 2014-12-22 — End: 2014-12-22
  Administered 2014-12-22: 37.5 g
  Filled 2014-12-22: qty 1

## 2014-12-22 MED ORDER — AMOXICILLIN-POT CLAVULANATE 500-125 MG PO TABS
1.0000 | ORAL_TABLET | Freq: Three times a day (TID) | ORAL | Status: DC
  Administered 2014-12-22 – 2014-12-23 (×3): 500 mg via ORAL
  Filled 2014-12-22 (×5): qty 1

## 2014-12-22 NOTE — Progress Notes (Signed)
[facility] PROGRESS NOTE  Assessment/Plan: Severe sepsis due to HCAP/UTI - Sepsis parameters improved. -Urine cx with Klebsiella sensitive to cipro and 1/2 BC with Veillonella Species: discussed with ID, Dr. Ninetta Lights, via phone  and [facility] could be treated with augmentin. -Will DC IV antibiotics and place on PO augmentin and cipro.  Acute renal failure with metabolic acidosis: - Secondary to prerenal etiology, sepsis - Cr continues to improve: is 1.62 today. -Metabolic acidosis has resolved.   Electrolyte abnormalities - sodium back to normal of 141. - Chloride improved.  -Is hypokalemic to 3.2 today. Will replete PO.  Severe deconditioning, severe malnutrition  - With progressive failure to thrive, high risk aspiration - Discussed DNR/DNI. - Family very clear with decision to keep mother comfortable, agreeable with IV fluids and antibiotics, no pressors/intuabtion as noted above  - comfort feeding as pt able to tolerate      Code Status: DNR/DNI Family Communication: none Disposition Plan: hope for DC home in next 24-48 hours.   Consultants:  PCCM  Procedures:  none  Antibiotics:  vanc and zosyn  HPI/Subjective: Non verbal.  Objective: Filed Vitals:   12/22/14 0500 12/22/14 0652 12/22/14 0859 12/22/14 1305  BP:  151/76  163/67  Pulse:  74    Temp:  97.6 F (36.4 C)  98.9 F (37.2 C)  TempSrc:  Oral    Resp:  18  20  Height:      Weight: 67 kg (147 lb 11.3 oz)     SpO2:  97% 99% 100%    Intake/Output Summary (Last 24 hours) at 12/22/14 1619 Last data filed at 12/22/14 1600  Gross per 24 hour  Intake    510 ml  Output    650 ml  Net   -140 ml   Filed Weights   12/20/14 0500 12/21/14 0500 12/22/14 0500  Weight: 68.6 kg (151 lb 3.8 oz) 71.1 kg (156 lb 12 oz) 67 kg (147 lb 11.3 oz)    Exam:  General: in no acute distress.  HEENT: No bruits, no goiter. -JVD Heart: Regular rate and rhythm. Lungs: Good air movement, clear Abdomen:  Soft, nontender, nondistended, positive bowel sounds.     Data Reviewed: Basic Metabolic Panel:  Recent Labs Lab 12/18/14 1113 12/18/14 1720 12/19/14 0241 12/20/14 0250 12/21/14 0900 12/22/14 0535  NA 146*  --  147* 140 140 141  K 2.9*  --  4.1 3.8 4.0 3.2*  CL 112  --  121* 119* 114* 111  CO2 21  --  13* 16* 15* 24  GLUCOSE 92  --  41* 184* 128* 111*  BUN 10  --  28*  CREATININE 1.49*  --  2.16* 2.50* 1.84* 1.62*  CALCIUM 8.9  --  7.6* 8.0* 8.0* 8.2*  MG  --  1.5  --   --   --   --   PHOS  --  1.1*  --   --   --   --    Liver Function Tests:  Recent Labs Lab 12/18/14 1113  AST 35  ALT 15  ALKPHOS 73  BILITOT 1.5*  PROT 5.6*  ALBUMIN 2.8*   No results for input(s): LIPASE, AMYLASE in the last 168 hours. No results for input(s): AMMONIA in the last 168 hours. CBC:  Recent Labs Lab 12/18/14 1113 12/19/14 0241 12/20/14 0250 12/21/14 0900  WBC 6.8 14.1* 28.4* 20.4*  NEUTROABS 5.8  --   --   --   HGB 15.6*  12.9 13.1 13.1  HCT 48.8* 40.4 38.5 37.7  MCV 90.4 89.8 83.7 80.9  PLT PLATELET CLUMPS NOTED ON SMEAR, UNABLE TO ESTIMATE 92* 63* 73*   Cardiac Enzymes: No results for input(s): CKTOTAL, CKMB, CKMBINDEX, TROPONINI in the last 168 hours. BNP (last 3 results)  Recent Labs  09/19/14 1651  PROBNP 6958.0*   CBG:  Recent Labs Lab 12/22/14 0403 12/22/14 0750 12/22/14 1157 12/22/14 1221 12/22/14 1600  GLUCAP 121* 76 69* 81 85    Recent Results (from the past 240 hour(s))  Urine culture     Status: None   Collection Time: 12/18/14 10:32 AM  Result Value Ref Range Status   Specimen Description URINE, CATHETERIZED  Final   Special Requests NONE  Final   Colony Count   Final    >=100,000 COLONIES/ML Performed at [facility]    Culture   Final    KLEBSIELLA PNEUMONIAE Note: Confirmed Extended Spectrum Beta-Lactamase Producer (ESBL) CRITICAL RESULT CALLED TO, READ BACK BY AND VERIFIED WITH: KERA A. AT 9:40AM ON 12/21/2014  HAJAM Performed at [facility]    Report Status 12/21/2014 FINAL  Final   Organism ID, Bacteria KLEBSIELLA PNEUMONIAE  Final      Susceptibility   Klebsiella pneumoniae - MIC*    AMPICILLIN >=32 RESISTANT Resistant     CEFAZOLIN >=64 RESISTANT Resistant     CIPROFLOXACIN 0.5 SENSITIVE Sensitive     GENTAMICIN >=16 RESISTANT Resistant     LEVOFLOXACIN 1 SENSITIVE Sensitive     NITROFURANTOIN 128 RESISTANT Resistant     TOBRAMYCIN 8 INTERMEDIATE Intermediate     TRIMETH/SULFA >=320 RESISTANT Resistant     PIP/TAZO <=4 SENSITIVE Sensitive     IMIPENEM <=0.25 SENSITIVE Sensitive     * KLEBSIELLA PNEUMONIAE  Blood culture (routine x 2)     Status: None (Preliminary result)   Collection Time: 12/18/14 11:15 AM  Result Value Ref Range Status   Specimen Description BLOOD LEFT HAND  Final   Special Requests BOTTLES DRAWN AEROBIC AND ANAEROBIC 5CC  Final   Culture   Final           BLOOD CULTURE RECEIVED NO GROWTH TO DATE CULTURE WILL BE HELD FOR 5 DAYS BEFORE ISSUING A FINAL NEGATIVE REPORT Performed at [facility]    Report Status PENDING  Incomplete  Blood culture (routine x 2)     Status: None   Collection Time: 12/18/14 11:25 AM  Result Value Ref Range Status   Specimen Description BLOOD HAND LEFT  Final   Special Requests BOTTLES DRAWN AEROBIC AND ANAEROBIC 2.5CC  Final   Culture   Final    VEILLONELLA SPECIES Note: Gram Stain Report Called to,Read Back By and Verified With: NICKY POTTER 12/22/14 0955 BY SMITHERSJ Performed at [facility]    Report Status 12/22/2014 FINAL  Final  MRSA PCR Screening     Status: None   Collection Time: 12/18/14  4:31 PM  Result Value Ref Range Status   MRSA by PCR NEGATIVE NEGATIVE Final    Comment:        The GeneXpert MRSA Assay (FDA approved for NASAL specimens only), is one component of a comprehensive MRSA colonization surveillance program. It is not intended to diagnose MRSA infection nor to guide  or monitor treatment for MRSA infections.   Clostridium Difficile by PCR     Status: None   Collection Time: 12/18/14 11:51 PM  Result Value Ref Range Status   C difficile by  pcr NEGATIVE NEGATIVE Final     Studies: No results found.  Scheduled Meds: . albuterol  2.5 mg Nebulization TID  . enoxaparin (LOVENOX) injection  30 mg Subcutaneous Q24H  . ketotifen  1 drop Both Eyes BID  . memantine  10 mg Oral BID  . pantoprazole  40 mg Oral Daily  . piperacillin-tazobactam (ZOSYN)  IV  2.25 g Intravenous 3 times per day  . predniSONE  5 mg Oral Daily  . vancomycin  1,000 mg Intravenous Q48H   Continuous Infusions:   Time Spent: 40 minutes  HERNANDEZ ACOSTA,ESTELA  [facility] Pager [phone].  If 8PM-8AM, please contact night-coverage at [email or web address], password [facility] 12/22/2014, 4:19 PM  LOS: 4 days

## 2014-12-22 NOTE — Progress Notes (Signed)
Peripherally Inserted Central Catheter/Midline Placement  The IV Nurse has discussed with the patient and/or persons authorized to consent for the patient, the purpose of this procedure and the potential benefits and risks involved with this procedure.  The benefits include less needle sticks, lab draws from the catheter and patient may be discharged home with the catheter.  Risks include, but not limited to, infection, bleeding, blood clot (thrombus formation), and puncture of an artery; nerve damage and irregular heat beat.  Alternatives to this procedure were also discussed.  PICC/Midline Placement Documentation     Consent obtained from daughter   [name], [name] 12/22/2014, 2:00 PM

## 2014-12-22 NOTE — Care Management Note (Unsigned)
    Page 1 of 1   12/22/2014     11:49:44 AM CARE MANAGEMENT NOTE 12/22/2014  Patient:  [name],Nitika L   Account Number:  [id number]  Date Initiated:  12/22/2014  Documentation initiated by:  Letha CapeAYLOR,Deneen Slager  Subjective/Objective Assessment:   dx hcap  admit- lives with daughter.     Action/Plan:   Anticipated DC Date:  12/23/2014   Anticipated DC Plan:  HOME W HOME HEALTH SERVICES      DC Planning Services  CM consult      Choice offered to / List presented to:             Status of service:  In process, will continue to follow Medicare Important Message given?  YES (If response is "NO", the following Medicare IM given date fields will be blank) Date Medicare IM given:  12/22/2014 Medicare IM given by:  Letha CapeAYLOR,Obdulio Mash Date Additional Medicare IM given:   Additional Medicare IM given by:    Discharge Disposition:    Per UR Regulation:  Reviewed for med. necessity/level of care/duration of stay  If discussed at Long Length of Stay Meetings, dates discussed:    Comments:  12/22/14 1146 Letha Capeeborah Chessie Neuharth RN, BSN [phone] patient lives with daughter, she is patient's caregiver, NCM asked if she would like hh services and she stated no, she has been taking care of her mother this long she will just continue.  She also stated patient use to [facility], but does not any longer.  NCM informed daughter if she changes her mind about hh services to let me know.

## 2014-12-23 DIAGNOSIS — N3000 Acute cystitis without hematuria: Secondary | ICD-10-CM

## 2014-12-23 LAB — GLUCOSE, CAPILLARY
GLUCOSE-CAPILLARY: 105 mg/dL — AB (ref 70–99)
GLUCOSE-CAPILLARY: 167 mg/dL — AB (ref 70–99)
Glucose-Capillary: 99 mg/dL (ref 70–99)
Glucose-Capillary: 89 mg/dL (ref 70–99)
Glucose-Capillary: 91 mg/dL (ref 70–99)
Glucose-Capillary: 76 mg/dL (ref 70–99)

## 2014-12-23 LAB — CBC
HEMATOCRIT: 36.7 % (ref 36.0–46.0)
Hemoglobin: 12.6 g/dL (ref 12.0–15.0)
MCH: 28.2 pg (ref 26.0–34.0)
MCHC: 34.3 g/dL (ref 30.0–36.0)
MCV: 82.1 fL (ref 78.0–100.0)
Platelets: 56 10*3/uL — ABNORMAL LOW (ref 150–400)
RBC: 4.47 MIL/uL (ref 3.87–5.11)
RDW: 14.5 % (ref 11.5–15.5)
WBC: 11.9 10*3/uL — ABNORMAL HIGH (ref 4.0–10.5)

## 2014-12-23 LAB — BASIC METABOLIC PANEL
ANION GAP: 13 (ref 5–15)
BUN: 26 mg/dL — ABNORMAL HIGH (ref 6–23)
CALCIUM: 8.3 mg/dL — AB (ref 8.4–10.5)
CHLORIDE: 109 mmol/L (ref 96–112)
CO2: 19 mmol/L (ref 19–32)
CREATININE: 1.34 mg/dL — AB (ref 0.50–1.10)
GFR calc non Af Amer: 36 mL/min — ABNORMAL LOW (ref >90)
GFR, EST AFRICAN AMERICAN: 41 mL/min — AB (ref >90)
GLUCOSE: 84 mg/dL (ref 70–99)
POTASSIUM: 2.8 mmol/L — AB (ref 3.5–5.1)
Sodium: 141 mmol/L (ref 135–145)

## 2014-12-23 MED ORDER — POTASSIUM CHLORIDE CRYS ER 20 MEQ PO TBCR
40.0000 meq | EXTENDED_RELEASE_TABLET | ORAL | Status: AC
  Administered 2014-12-23 – 2014-12-24 (×4): 40 meq via ORAL
  Filled 2014-12-23 (×4): qty 2

## 2014-12-23 MED ORDER — AMOXICILLIN-POT CLAVULANATE 500-125 MG PO TABS
1.0000 | ORAL_TABLET | Freq: Two times a day (BID) | ORAL | Status: DC
  Administered 2014-12-23 – 2014-12-24 (×2): 500 mg via ORAL
  Filled 2014-12-23 (×3): qty 1

## 2014-12-23 MED ORDER — DEXTROSE 50 % IV SOLN
INTRAVENOUS | Status: AC
  Filled 2014-12-23: qty 50

## 2014-12-23 NOTE — Progress Notes (Signed)
[facility] PROGRESS NOTE  Assessment/Plan: Severe sepsis due to HCAP/UTI - Sepsis parameters improved. -Urine cx with Klebsiella sensitive to cipro and 1/2 BC with Veillonella Species: discussed with ID, Dr. Ninetta Lights, via phone  and [facility] could be treated with augmentin. -Will DC IV antibiotics and place on PO augmentin and cipro.  Acute renal failure with metabolic acidosis: - Secondary to prerenal etiology, sepsis - Cr continues to improve: is 1.34 today. -Metabolic acidosis has resolved.   Electrolyte abnormalities - sodium back to normal of 141. - Chloride improved.  -Is hypokalemic to 2.8 today. Will replete PO.  -Check Mg levels.   Severe deconditioning, severe malnutrition  - With progressive failure to thrive, high risk aspiration - Discussed DNR/DNI. - Family very clear with decision to keep mother comfortable, agreeable with IV fluids and antibiotics, no pressors/intuabtion as noted above  - comfort feeding as pt able to tolerate      Code Status: DNR/DNI Family Communication: none Disposition Plan: Was to DC home today but daughter asks if she can stay tonight as their power failed and they have no heat at home.   Consultants:  PCCM  Procedures:  none  Antibiotics:  cipro  augmentin  HPI/Subjective: Non verbal.  Objective: Filed Vitals:   12/23/14 0617 12/23/14 0620 12/23/14 0912 12/23/14 1334  BP:  159/95    Pulse:  77    Temp:  97.9 F (36.6 C)    TempSrc:  Oral    Resp:  15    Height:      Weight: 66.3 kg (146 lb 2.6 oz)     SpO2:  100% 100% 95%    Intake/Output Summary (Last 24 hours) at 12/23/14 1604 Last data filed at 12/23/14 1019  Gross per 24 hour  Intake    240 ml  Output      0 ml  Net    240 ml   Filed Weights   12/21/14 0500 12/22/14 0500 12/23/14 0617  Weight: 71.1 kg (156 lb 12 oz) 67 kg (147 lb 11.3 oz) 66.3 kg (146 lb 2.6 oz)    Exam:  General: in no acute distress.  HEENT: No bruits, no goiter.  -JVD Heart: Regular rate and rhythm. Lungs: Good air movement, clear Abdomen: Soft, nontender, nondistended, positive bowel sounds.     Data Reviewed: Basic Metabolic Panel:  Recent Labs Lab 12/18/14 1720 12/19/14 0241 12/20/14 0250 12/21/14 0900 12/22/14 0535 12/23/14 0608  NA  --  147* 140 140 141 141  K  --  4.1 3.8 4.0 3.2* 2.8*  CL  --  121* 119* 114* 111 109  CO2  --  13* 16* 15* 24 19  GLUCOSE  --  41* 184* 128* 111* 84  BUN  --  28* 26*  CREATININE  --  2.16* 2.50* 1.84* 1.62* 1.34*  CALCIUM  --  7.6* 8.0* 8.0* 8.2* 8.3*  MG 1.5  --   --   --   --   --   PHOS 1.1*  --   --   --   --   --    Liver Function Tests:  Recent Labs Lab 12/18/14 1113  AST 35  ALT 15  ALKPHOS 73  BILITOT 1.5*  PROT 5.6*  ALBUMIN 2.8*   No results for input(s): LIPASE, AMYLASE in the last 168 hours. No results for input(s): AMMONIA in the last 168 hours. CBC:  Recent Labs Lab 12/18/14 1113 12/19/14 0241 12/20/14 0250 12/21/14 0900  12/23/14 0608  WBC 6.8 14.1* 28.4* 20.4* 11.9*  NEUTROABS 5.8  --   --   --   --   HGB 15.6* 12.9 13.1 13.1 12.6  HCT 48.8* 40.4 38.5 37.7 36.7  MCV 90.4 89.8 83.7 80.9 82.1  PLT PLATELET CLUMPS NOTED ON SMEAR, UNABLE TO ESTIMATE 92* 63* 73* 56*   Cardiac Enzymes: No results for input(s): CKTOTAL, CKMB, CKMBINDEX, TROPONINI in the last 168 hours. BNP (last 3 results)  Recent Labs  09/19/14 1651  PROBNP 6958.0*   CBG:  Recent Labs Lab 12/22/14 2004 12/23/14 0111 12/23/14 0623 12/23/14 0743 12/23/14 1206  GLUCAP 95 99 89 91 76    Recent Results (from the past 240 hour(s))  Urine culture     Status: None   Collection Time: 12/18/14 10:32 AM  Result Value Ref Range Status   Specimen Description URINE, CATHETERIZED  Final   Special Requests NONE  Final   Colony Count   Final    >=100,000 COLONIES/ML Performed at [facility]    Culture   Final    KLEBSIELLA PNEUMONIAE Note: Confirmed Extended Spectrum  Beta-Lactamase Producer (ESBL) CRITICAL RESULT CALLED TO, READ BACK BY AND VERIFIED WITH: KERA A. AT 9:40AM ON 12/21/2014 HAJAM Performed at [facility]    Report Status 12/21/2014 FINAL  Final   Organism ID, Bacteria KLEBSIELLA PNEUMONIAE  Final      Susceptibility   Klebsiella pneumoniae - MIC*    AMPICILLIN >=32 RESISTANT Resistant     CEFAZOLIN >=64 RESISTANT Resistant     CIPROFLOXACIN 0.5 SENSITIVE Sensitive     GENTAMICIN >=16 RESISTANT Resistant     LEVOFLOXACIN 1 SENSITIVE Sensitive     NITROFURANTOIN 128 RESISTANT Resistant     TOBRAMYCIN 8 INTERMEDIATE Intermediate     TRIMETH/SULFA >=320 RESISTANT Resistant     PIP/TAZO <=4 SENSITIVE Sensitive     IMIPENEM <=0.25 SENSITIVE Sensitive     * KLEBSIELLA PNEUMONIAE  Blood culture (routine x 2)     Status: None (Preliminary result)   Collection Time: 12/18/14 11:15 AM  Result Value Ref Range Status   Specimen Description BLOOD LEFT HAND  Final   Special Requests BOTTLES DRAWN AEROBIC AND ANAEROBIC 5CC  Final   Culture   Final           BLOOD CULTURE RECEIVED NO GROWTH TO DATE CULTURE WILL BE HELD FOR 5 DAYS BEFORE ISSUING A FINAL NEGATIVE REPORT Performed at [facility]    Report Status PENDING  Incomplete  Blood culture (routine x 2)     Status: None   Collection Time: 12/18/14 11:25 AM  Result Value Ref Range Status   Specimen Description BLOOD HAND LEFT  Final   Special Requests BOTTLES DRAWN AEROBIC AND ANAEROBIC 2.5CC  Final   Culture   Final    VEILLONELLA SPECIES Note: Gram Stain Report Called to,Read Back By and Verified With: NICKY POTTER 12/22/14 0955 BY SMITHERSJ Performed at [facility]    Report Status 12/22/2014 FINAL  Final  MRSA PCR Screening     Status: None   Collection Time: 12/18/14  4:31 PM  Result Value Ref Range Status   MRSA by PCR NEGATIVE NEGATIVE Final    Comment:        The GeneXpert MRSA Assay (FDA approved for NASAL specimens only), is one component of  a comprehensive MRSA colonization surveillance program. It is not intended to diagnose MRSA infection nor to guide or monitor treatment for MRSA  infections.   Clostridium Difficile by PCR     Status: None   Collection Time: 12/18/14 11:51 PM  Result Value Ref Range Status   C difficile by pcr NEGATIVE NEGATIVE Final     Studies: No results found.  Scheduled Meds: . albuterol  2.5 mg Nebulization TID  . amoxicillin-clavulanate  1 tablet Oral BID  . ciprofloxacin  250 mg Oral BID  . dextrose      . enoxaparin (LOVENOX) injection  30 mg Subcutaneous Q24H  . ketotifen  1 drop Both Eyes BID  . memantine  10 mg Oral BID  . pantoprazole  40 mg Oral Daily  . potassium chloride  40 mEq Oral Q4H  . predniSONE  5 mg Oral Daily   Continuous Infusions:   Time Spent: 40 minutes  HERNANDEZ ACOSTA,ESTELA  [facility] Pager [phone].  If 8PM-8AM, please contact night-coverage at [email or web address], password [email or web address] 12/23/2014, 4:04 PM  LOS: 5 days

## 2014-12-24 LAB — GLUCOSE, CAPILLARY
GLUCOSE-CAPILLARY: 95 mg/dL (ref 70–99)
GLUCOSE-CAPILLARY: 107 mg/dL — AB (ref 70–99)
Glucose-Capillary: 131 mg/dL — ABNORMAL HIGH (ref 70–99)
Glucose-Capillary: 88 mg/dL (ref 70–99)
Glucose-Capillary: 95 mg/dL (ref 70–99)

## 2014-12-24 MED ORDER — CIPROFLOXACIN HCL 250 MG PO TABS: 250.0000 mg | ORAL_TABLET | Freq: Two times a day (BID) | ORAL | Status: AC

## 2014-12-24 MED ORDER — AMOXICILLIN-POT CLAVULANATE 500-125 MG PO TABS: 1.0000 | ORAL_TABLET | Freq: Two times a day (BID) | ORAL | Status: AC

## 2014-12-24 NOTE — Discharge Summary (Signed)
Physician Discharge Summary  [name]:[id number] DOB: Mar 10, 1929 DOA: 12/18/2014  PCP: Thane Edu, MD  Admit date: 12/18/2014 Discharge date: 12/24/2014  Time spent: 45 minutes  Recommendations for Outpatient Follow-up:  -Will be discharged home today. -Advised to follow up with PCP in 2 weeks. -Has 5 days of Augmentin and 3 days of Cipro remaining on DC.   Discharge Diagnoses:  Active Problems:   Sepsis   HCAP (healthcare-associated pneumonia)   Severe sepsis   UTI (urinary tract infection)   Discharge Condition: Stable and improved  Filed Weights   12/22/14 0500 12/23/14 0617 12/24/14 0643  Weight: 67 kg (147 lb 11.3 oz) 66.3 kg (146 lb 2.6 oz) 64.7 kg (142 lb 10.2 oz)    History of present illness:  Patient is 81 year old female with hypertension, moderate dementia, asthma, presented to [facility] emergency department for further evaluation of progressively worsening confusion and lethargy at home. Please note that patient is unable to provide any history of the time of the admission, daughter at bedside able to assist with details. She explains her mother has been sick for the past week, she has taken her mom to primary care physician, no medications given at that time. Patient has continued to become more confused, daughter noted fevers as high as 101 Fahrenheit, shortness of breath noted with exertion, productive cough of yellow sputum also noted for the past 24 hours. No reported abdominal concerns. Daughter also reports dark and malodorous urine. Of note, patient was recently discharged from the hospital in October 2015 after being treated for altered mental status secondary to sepsis in the setting of aspiration pneumonia.  In emergency department, patient noted to be lethargic, vital signs notable for temperature 102.2 Fahrenheit, heart rate 1:15, respiratory rate 33 breaths per minute, blood pressure 73/39. Blood work notable for hemoglobin 15.6,  sodium 146, potassium 2.9, creatinine 1.49. Chest x-ray consistent with right upper lobe pneumonia. Tried hospitalist asked to admit to step down unit for further evaluation.  Hospital Course:   Severe sepsis due to HCAP/UTI - Sepsis parameters resolved. -Urine cx with Klebsiella sensitive to cipro and 1/2 BC with Veillonella Species: discussed with ID, Dr. Ninetta Lights, via phone and [facility] could be treated with augmentin. -Has 5 days of augmentin and 3 of cipro remaining on DC.  Acute renal failure with metabolic acidosis: - Secondary to prerenal etiology, sepsis - Cr continues to improve: is 1.34 on DC from 2.5 on admission. -Metabolic acidosis has resolved.  Electrolyte abnormalities - sodium back to normal of 141. - Chloride improved.  -Hypokalemia repleted.  Severe deconditioning, severe malnutrition  - With progressive failure to thrive, high risk aspiration - Discussed DNR/DNI. - Family very clear with decision to keep mother comfortable, agreeable with IV fluids and antibiotics, no pressors/intuabtion as noted above  - comfort feeding as pt able to tolerate    Procedures:  None   Consultations:  Phone consult with ID  Discharge Instructions  Discharge Instructions    Increase activity slowly    Complete by:  As directed             Medication List    TAKE these medications        acetaminophen 500 MG tablet  Commonly known as:  TYLENOL  Take 1,000 mg by mouth 2 (two) times daily.     amoxicillin-clavulanate 500-125 MG per tablet  Commonly known as:  AUGMENTIN  Take 1 tablet (500 mg total) by mouth 2 (two) times daily.  azelastine 0.05 % ophthalmic solution  Commonly known as:  OPTIVAR  Place 1 drop into both eyes 2 (two) times daily.     budesonide 0.5 MG/2ML nebulizer solution  Commonly known as:  PULMICORT  Take 0.5 mg by nebulization 2 (two) times daily as needed. wheezing     calcium carbonate 500 MG chewable tablet  Commonly known as:  TUMS  - dosed in mg elemental calcium  Chew 1 tablet by mouth daily.     ciprofloxacin 250 MG tablet  Commonly known as:  CIPRO  Take 1 tablet (250 mg total) by mouth 2 (two) times daily.     feeding supplement (ENSURE COMPLETE) Liqd  Take 237 mLs by mouth 2 (two) times daily between meals.     fluticasone 50 MCG/ACT nasal spray  Commonly known as:  FLONASE  Place 2 sprays into the nose 2 (two) times daily as needed. For congestion and runny nose     magnesium hydroxide 400 MG/5ML suspension  Commonly known as:  MILK OF MAGNESIA  Take 15 mLs by mouth daily as needed for mild constipation.     memantine 10 MG tablet  Commonly known as:  NAMENDA  Take 10 mg by mouth 2 (two) times daily.     montelukast 10 MG tablet  Commonly known as:  SINGULAIR  Take 10 mg by mouth at bedtime.     multivitamins ther. w/minerals Tabs tablet  Take 1 tablet by mouth daily.     omeprazole 40 MG capsule  Commonly known as:  PRILOSEC  Take 40 mg by mouth daily.     predniSONE 5 MG tablet  Commonly known as:  DELTASONE  Take 5 mg by mouth daily.     PROVENTIL (2.5 MG/3ML) 0.083% nebulizer solution  Generic drug:  albuterol  Take 2.5 mg by nebulization every 6 (six) hours as needed. For shortness of breath     Q-TUSSIN DM 10-100 MG/5ML liquid  Generic drug:  Dextromethorphan-Guaifenesin  Take 10 mLs by mouth every 6 (six) hours as needed (for cough/ congestion).     THICK-IT Powd  Generic drug:  STARCH-MALTO DEXTRIN  Take 1 application by mouth daily as needed (nectar consistency).     Vitamin D3 2000 UNITS Tabs  Take 2,000 Units by mouth daily.       No Known Allergies     Follow-up Information    Follow up with Thane Edu, MD. Schedule an appointment as soon as possible for a visit in 2 weeks.   Specialty:  Family Medicine   Contact information:   [address] [place] [address] [phone]        The results of significant diagnostics from this  hospitalization (including imaging, microbiology, ancillary and laboratory) are listed below for reference.    Significant Diagnostic Studies: Ct Head Wo Contrast  12/18/2014   CLINICAL DATA:  Altered mental status  EXAM: CT HEAD WITHOUT CONTRAST  TECHNIQUE: Contiguous axial images were obtained from the base of the skull through the vertex without intravenous contrast.  COMPARISON:  September 19, 2014  FINDINGS: There is moderate diffuse atrophy, stable. There is no intracranial mass, hemorrhage, extra-axial fluid collection, or midline shift. There is small vessel disease throughout the centra semiovale bilaterally. There is small vessel disease with prior lacunar infarcts in the anterior limbs of the left internal and external capsules. No new gray-white compartment lesion is identified. There are several lacunar infarcts in the right external capsule. There is no acute appearing  infarct. Bony calvarium appears intact. Visualized mastoid air cells are clear. There are air-fluid levels in each maxillary antrum. There is extensive ethmoid sinus disease bilaterally.  IMPRESSION: Atrophy with extensive small vessel disease. Prior lacunar infarcts in the basal ganglia regions bilaterally. No acute infarct apparent. No hemorrhage or mass effect. Paranasal sinus disease is noted at multiple sites.   Electronically Signed   By: Bretta BangWilliam  Woodruff M.D.   On: 12/18/2014 12:55   Dg Chest Portable 1 View  12/18/2014   CLINICAL DATA:  Fever and congestion ; altered mental status  EXAM: PORTABLE CHEST - 1 VIEW  COMPARISON:  September 25, 2014  FINDINGS: There is focal consolidation right upper lobe adjacent to the minor fissure. There is underlying emphysema. Elsewhere lungs are clear. Heart size and pulmonary vascularity are normal. No adenopathy. There are chronic rotator cuff tears in each shoulder. Stomach is distended with air.  IMPRESSION: Focal consolidation right upper lobe adjacent to the minor fissure. Lungs  elsewhere clear. Underlying emphysema. Stomach distended with air.   Electronically Signed   By: Bretta BangWilliam  Woodruff M.D.   On: 12/18/2014 10:47    Microbiology: Recent Results (from the past 240 hour(s))  Urine culture     Status: None   Collection Time: 12/18/14 10:32 AM  Result Value Ref Range Status   Specimen Description URINE, CATHETERIZED  Final   Special Requests NONE  Final   Colony Count   Final    >=100,000 COLONIES/ML Performed at [facility]    Culture   Final    KLEBSIELLA PNEUMONIAE Note: Confirmed Extended Spectrum Beta-Lactamase Producer (ESBL) CRITICAL RESULT CALLED TO, READ BACK BY AND VERIFIED WITH: KERA A. AT 9:40AM ON 12/21/2014 HAJAM Performed at [facility]    Report Status 12/21/2014 FINAL  Final   Organism ID, Bacteria KLEBSIELLA PNEUMONIAE  Final      Susceptibility   Klebsiella pneumoniae - MIC*    AMPICILLIN >=32 RESISTANT Resistant     CEFAZOLIN >=64 RESISTANT Resistant     CIPROFLOXACIN 0.5 SENSITIVE Sensitive     GENTAMICIN >=16 RESISTANT Resistant     LEVOFLOXACIN 1 SENSITIVE Sensitive     NITROFURANTOIN 128 RESISTANT Resistant     TOBRAMYCIN 8 INTERMEDIATE Intermediate     TRIMETH/SULFA >=320 RESISTANT Resistant     PIP/TAZO <=4 SENSITIVE Sensitive     IMIPENEM <=0.25 SENSITIVE Sensitive     * KLEBSIELLA PNEUMONIAE  Blood culture (routine x 2)     Status: None (Preliminary result)   Collection Time: 12/18/14 11:15 AM  Result Value Ref Range Status   Specimen Description BLOOD LEFT HAND  Final   Special Requests BOTTLES DRAWN AEROBIC AND ANAEROBIC 5CC  Final   Culture   Final    GRAM NEGATIVE RODS Note: CRITICAL RESULT CALLED TO, READ BACK BY AND VERIFIED WITH: PEARLETTE RN @524PM  VINCJ 12/23/14 Performed at [facility]    Report Status PENDING  Incomplete  Blood culture (routine x 2)     Status: None   Collection Time: 12/18/14 11:25 AM  Result Value Ref Range Status   Specimen Description BLOOD HAND LEFT   Final   Special Requests BOTTLES DRAWN AEROBIC AND ANAEROBIC 2.5CC  Final   Culture   Final    VEILLONELLA SPECIES Note: Gram Stain Report Called to,Read Back By and Verified With: NICKY POTTER 12/22/14 0955 BY SMITHERSJ Performed at [facility]    Report Status 12/22/2014 FINAL  Final  MRSA PCR Screening  Status: None   Collection Time: 12/18/14  4:31 PM  Result Value Ref Range Status   MRSA by PCR NEGATIVE NEGATIVE Final    Comment:        The GeneXpert MRSA Assay (FDA approved for NASAL specimens only), is one component of a comprehensive MRSA colonization surveillance program. It is not intended to diagnose MRSA infection nor to guide or monitor treatment for MRSA infections.   Clostridium Difficile by PCR     Status: None   Collection Time: 12/18/14 11:51 PM  Result Value Ref Range Status   C difficile by pcr NEGATIVE NEGATIVE Final     Labs: Basic Metabolic Panel:  Recent Labs Lab 12/18/14 1720 12/19/14 0241 12/20/14 0250 12/21/14 0900 12/22/14 0535 12/23/14 0608  NA  --  147* 140 140 141 141  K  --  4.1 3.8 4.0 3.2* 2.8*  CL  --  121* 119* 114* 111 109  CO2  --  13* 16* 15* 24 19  GLUCOSE  --  41* 184* 128* 111* 84  BUN  --  28* 26*  CREATININE  --  2.16* 2.50* 1.84* 1.62* 1.34*  CALCIUM  --  7.6* 8.0* 8.0* 8.2* 8.3*  MG 1.5  --   --   --   --   --   PHOS 1.1*  --   --   --   --   --    Liver Function Tests:  Recent Labs Lab 12/18/14 1113  AST 35  ALT 15  ALKPHOS 73  BILITOT 1.5*  PROT 5.6*  ALBUMIN 2.8*   No results for input(s): LIPASE, AMYLASE in the last 168 hours. No results for input(s): AMMONIA in the last 168 hours. CBC:  Recent Labs Lab 12/18/14 1113 12/19/14 0241 12/20/14 0250 12/21/14 0900 12/23/14 0608  WBC 6.8 14.1* 28.4* 20.4* 11.9*  NEUTROABS 5.8  --   --   --   --   HGB 15.6* 12.9 13.1 13.1 12.6  HCT 48.8* 40.4 38.5 37.7 36.7  MCV 90.4 89.8 83.7 80.9 82.1  PLT PLATELET CLUMPS NOTED ON SMEAR,  UNABLE TO ESTIMATE 92* 63* 73* 56*   Cardiac Enzymes: No results for input(s): CKTOTAL, CKMB, CKMBINDEX, TROPONINI in the last 168 hours. BNP: BNP (last 3 results)  Recent Labs  09/19/14 1651  PROBNP 6958.0*   CBG:  Recent Labs Lab 12/23/14 2021 12/24/14 12/24/14 0426 12/24/14 0811 12/24/14 1155  GLUCAP 167* 131* 95 88 95       Signed:  HERNANDEZ ACOSTA,ESTELA  [facility] Pager: [phone] 12/24/2014, 2:38 PM

## 2014-12-24 NOTE — Discharge Instructions (Signed)
Take the cipro for 3 more days and the augmentin for 5 more days.

## 2014-12-24 NOTE — Plan of Care (Signed)
Problem: Phase III Progression Outcomes Goal: Foley discontinued Outcome: Not Applicable Date Met:  70/14/10 Pt incontinent, voids

## 2014-12-24 NOTE — Social Work (Signed)
CSW facilitated d/c home for bed bound patient. RN will contact [facility] when patient ready for transport.  Beverly Sessionsywan J Jesper Stirewalt MSW, LCSW [phone]

## 2014-12-25 LAB — CULTURE, BLOOD (ROUTINE X 2)

## 2014-12-28 ENCOUNTER — Ambulatory Visit
Admission: RE | Admit: 2014-12-28 | Discharge: 2014-12-28 | Disposition: A | Discharge status: Home / Self Care | Payer: No Typology Code available for payment source | Source: Ambulatory Visit | Attending: *Deleted | Admitting: *Deleted

## 2014-12-28 ENCOUNTER — Other Ambulatory Visit: Payer: Self-pay | Admitting: *Deleted

## 2014-12-28 DIAGNOSIS — M79641 Pain in right hand: Secondary | ICD-10-CM

## 2014-12-28 DIAGNOSIS — R609 Edema, unspecified: Secondary | ICD-10-CM

## 2015-05-17 ENCOUNTER — Emergency Department (HOSPITAL_COMMUNITY): Payer: Medicare (Managed Care)

## 2015-05-17 ENCOUNTER — Inpatient Hospital Stay (HOSPITAL_COMMUNITY)
Admission: EM | Admit: 2015-05-17 | Discharge: 2015-06-01 | DRG: 871 | Disposition: E | Payer: Medicare (Managed Care) | Attending: Oncology | Admitting: Oncology

## 2015-05-17 ENCOUNTER — Encounter (HOSPITAL_COMMUNITY): Payer: Self-pay | Admitting: Emergency Medicine

## 2015-05-17 DIAGNOSIS — R6521 Severe sepsis with septic shock: Secondary | ICD-10-CM | POA: Diagnosis present

## 2015-05-17 DIAGNOSIS — Z66 Do not resuscitate: Secondary | ICD-10-CM | POA: Diagnosis present

## 2015-05-17 DIAGNOSIS — N179 Acute kidney failure, unspecified: Secondary | ICD-10-CM | POA: Diagnosis present

## 2015-05-17 DIAGNOSIS — Z7952 Long term (current) use of systemic steroids: Secondary | ICD-10-CM

## 2015-05-17 DIAGNOSIS — R404 Transient alteration of awareness: Secondary | ICD-10-CM | POA: Diagnosis not present

## 2015-05-17 DIAGNOSIS — J45909 Unspecified asthma, uncomplicated: Secondary | ICD-10-CM | POA: Diagnosis present

## 2015-05-17 DIAGNOSIS — R740 Nonspecific elevation of levels of transaminase and lactic acid dehydrogenase [LDH]: Secondary | ICD-10-CM | POA: Diagnosis present

## 2015-05-17 DIAGNOSIS — R609 Edema, unspecified: Secondary | ICD-10-CM

## 2015-05-17 DIAGNOSIS — D6959 Other secondary thrombocytopenia: Secondary | ICD-10-CM | POA: Diagnosis present

## 2015-05-17 DIAGNOSIS — E869 Volume depletion, unspecified: Secondary | ICD-10-CM | POA: Diagnosis present

## 2015-05-17 DIAGNOSIS — J690 Pneumonitis due to inhalation of food and vomit: Secondary | ICD-10-CM | POA: Diagnosis present

## 2015-05-17 DIAGNOSIS — Z86718 Personal history of other venous thrombosis and embolism: Secondary | ICD-10-CM

## 2015-05-17 DIAGNOSIS — A419 Sepsis, unspecified organism: Principal | ICD-10-CM | POA: Diagnosis present

## 2015-05-17 DIAGNOSIS — R0902 Hypoxemia: Secondary | ICD-10-CM

## 2015-05-17 DIAGNOSIS — D696 Thrombocytopenia, unspecified: Secondary | ICD-10-CM | POA: Diagnosis present

## 2015-05-17 DIAGNOSIS — E860 Dehydration: Secondary | ICD-10-CM | POA: Diagnosis present

## 2015-05-17 DIAGNOSIS — I5032 Chronic diastolic (congestive) heart failure: Secondary | ICD-10-CM | POA: Diagnosis present

## 2015-05-17 DIAGNOSIS — R4182 Altered mental status, unspecified: Secondary | ICD-10-CM

## 2015-05-17 DIAGNOSIS — Z79899 Other long term (current) drug therapy: Secondary | ICD-10-CM

## 2015-05-17 DIAGNOSIS — Z515 Encounter for palliative care: Secondary | ICD-10-CM

## 2015-05-17 DIAGNOSIS — E872 Acidosis: Secondary | ICD-10-CM | POA: Diagnosis present

## 2015-05-17 DIAGNOSIS — R1310 Dysphagia, unspecified: Secondary | ICD-10-CM | POA: Diagnosis present

## 2015-05-17 DIAGNOSIS — Z86711 Personal history of pulmonary embolism: Secondary | ICD-10-CM

## 2015-05-17 DIAGNOSIS — F0390 Unspecified dementia without behavioral disturbance: Secondary | ICD-10-CM | POA: Diagnosis present

## 2015-05-17 DIAGNOSIS — M810 Age-related osteoporosis without current pathological fracture: Secondary | ICD-10-CM | POA: Diagnosis present

## 2015-05-17 DIAGNOSIS — I10 Essential (primary) hypertension: Secondary | ICD-10-CM | POA: Diagnosis present

## 2015-05-17 DIAGNOSIS — N390 Urinary tract infection, site not specified: Secondary | ICD-10-CM | POA: Diagnosis present

## 2015-05-17 DIAGNOSIS — E870 Hyperosmolality and hypernatremia: Secondary | ICD-10-CM | POA: Diagnosis present

## 2015-05-17 LAB — COMPREHENSIVE METABOLIC PANEL
ALBUMIN: 3 g/dL — AB (ref 3.5–5.0)
ALT: 92 U/L — ABNORMAL HIGH (ref 14–54)
AST: 122 U/L — AB (ref 15–41)
Alkaline Phosphatase: 77 U/L (ref 38–126)
BILIRUBIN TOTAL: 2.5 mg/dL — AB (ref 0.3–1.2)
BUN: 35 mg/dL — ABNORMAL HIGH (ref 6–20)
CO2: 22 mmol/L (ref 22–32)
Calcium: 9.1 mg/dL (ref 8.9–10.3)
Chloride: >130 mmol/L (ref 101–111)
Creatinine, Ser: 2.24 mg/dL — ABNORMAL HIGH (ref 0.44–1.00)
GFR calc Af Amer: 22 mL/min — ABNORMAL LOW (ref >60)
GFR, EST NON AFRICAN AMERICAN: 19 mL/min — AB (ref >60)
Glucose, Bld: 113 mg/dL — ABNORMAL HIGH (ref 65–99)
POTASSIUM: 3.8 mmol/L (ref 3.5–5.1)
SODIUM: 180 mmol/L — AB (ref 135–145)
TOTAL PROTEIN: 5.6 g/dL — AB (ref 6.5–8.1)

## 2015-05-17 LAB — URINALYSIS, ROUTINE W REFLEX MICROSCOPIC
Bilirubin Urine: NEGATIVE
Glucose, UA: NEGATIVE mg/dL
Ketones, ur: NEGATIVE mg/dL
NITRITE: POSITIVE — AB
Protein, ur: 30 mg/dL — AB
SPECIFIC GRAVITY, URINE: 1.023 (ref 1.005–1.030)
UROBILINOGEN UA: 0.2 mg/dL (ref 0.0–1.0)
pH: 5 (ref 5.0–8.0)

## 2015-05-17 LAB — CBC WITH DIFFERENTIAL/PLATELET
BASOS PCT: 0 % (ref 0–1)
Basophils Absolute: 0 10*3/uL (ref 0.0–0.1)
EOS PCT: 0 % (ref 0–5)
Eosinophils Absolute: 0 10*3/uL (ref 0.0–0.7)
HCT: 54.1 % — ABNORMAL HIGH (ref 36.0–46.0)
HEMOGLOBIN: 15.9 g/dL — AB (ref 12.0–15.0)
LYMPHS ABS: 0.5 10*3/uL — AB (ref 0.7–4.0)
Lymphocytes Relative: 10 % — ABNORMAL LOW (ref 12–46)
MCH: 29.7 pg (ref 26.0–34.0)
MCHC: 29.4 g/dL — ABNORMAL LOW (ref 30.0–36.0)
MCV: 101.1 fL — ABNORMAL HIGH (ref 78.0–100.0)
MONOS PCT: 3 % (ref 3–12)
Monocytes Absolute: 0.2 10*3/uL (ref 0.1–1.0)
NEUTROS ABS: 4.4 10*3/uL (ref 1.7–7.7)
Neutrophils Relative %: 87 % — ABNORMAL HIGH (ref 43–77)
PLATELETS: 59 10*3/uL — AB (ref 150–400)
RBC: 5.35 MIL/uL — AB (ref 3.87–5.11)
RDW: 18.3 % — ABNORMAL HIGH (ref 11.5–15.5)
WBC: 5.1 10*3/uL (ref 4.0–10.5)
nRBC: 4 /100 WBC — ABNORMAL HIGH

## 2015-05-17 LAB — I-STAT CG4 LACTIC ACID, ED
LACTIC ACID, VENOUS: 7.95 mmol/L — AB (ref 0.5–2.0)
Lactic Acid, Venous: 7.32 mmol/L (ref 0.5–2.0)

## 2015-05-17 LAB — I-STAT TROPONIN, ED: Troponin i, poc: 0.05 ng/mL (ref 0.00–0.08)

## 2015-05-17 LAB — URINE MICROSCOPIC-ADD ON

## 2015-05-17 MED ORDER — PIPERACILLIN-TAZOBACTAM 3.375 G IVPB 30 MIN
3.3750 g | Freq: Once | INTRAVENOUS | Status: AC
  Administered 2015-05-17: 3.375 g via INTRAVENOUS
  Filled 2015-05-17: qty 50

## 2015-05-17 MED ORDER — ACETAMINOPHEN 650 MG RE SUPP
650.0000 mg | Freq: Once | RECTAL | Status: AC
  Administered 2015-05-18: 650 mg via RECTAL
  Filled 2015-05-17: qty 1

## 2015-05-17 MED ORDER — VANCOMYCIN HCL 10 G IV SOLR
1250.0000 mg | Freq: Once | INTRAVENOUS | Status: AC
  Administered 2015-05-17: 1250 mg via INTRAVENOUS
  Filled 2015-05-17: qty 1250

## 2015-05-17 MED ORDER — VANCOMYCIN HCL IN DEXTROSE 1-5 GM/200ML-% IV SOLN
1000.0000 mg | Freq: Once | INTRAVENOUS | Status: DC
Start: 2015-05-17 — End: 2015-05-17

## 2015-05-17 MED ORDER — DEXTROSE 5 % IV SOLN: 500.0000 mg | Freq: Once | INTRAVENOUS | Status: DC

## 2015-05-17 MED ORDER — SODIUM CHLORIDE 0.9 % IV BOLUS (SEPSIS)
1000.0000 mL | INTRAVENOUS | Status: AC
  Administered 2015-05-17 (×2): 1000 mL via INTRAVENOUS

## 2015-05-17 MED ORDER — DEXTROSE 5 % IV SOLN: 1.0000 g | Freq: Once | INTRAVENOUS | Status: DC

## 2015-05-17 NOTE — ED Notes (Signed)
Pt's daughter walked into room, and found that the pt had vomited. Gurgling heard from the pt. This RN suctioned the pt's mouth. Pt desatted to 80%, and this RN placed the pt on a non-rebreather. Pt's HOB raised.

## 2015-05-17 NOTE — ED Notes (Signed)
Pt is unable to follow commands. Pt will look at you when someone is speaking to her, but will not respond or follow commands.

## 2015-05-17 NOTE — ED Notes (Signed)
Tech obtaining vitals at this time. Care handoff to [place], [place].

## 2015-05-17 NOTE — ED Notes (Signed)
This RN unable to obtain IV access x 2 attempts, and was unable to draw blood. Phlebotomy at bedside.

## 2015-05-17 NOTE — H&P (Signed)
Date: 05/12/2015               Patient Name:  [name] MRN: [id number]  DOB: February 23, 1929 Age / Sex: 81 y.o., female   PCP: Janifer Adie, MD         Medical Service: Internal Medicine Teaching Service         Attending Physician: Dr. Beryle Beams    First Contact: Dr. Wilhemena Durie Pager: [phone]  Second Contact: Dr. Gordy Levan Pager: [phone]       After Hours (After 5p/  First Contact Pager: [phone]  weekends / holidays): Second Contact Pager: [phone]   Chief Complaint: AMS  History of Present Illness: Pt is a 81 y/o F w/ PMHx of HTN, dementia, and asthma who presents with AMS. Hx obtained from pt's daughter who is her 65. Daughter says that she  was her usual self this morning until around 2:30pm. Around 3:00pm daughter states she saw pt straining to have a bowel movement which was stiff as clay w/normal brown color to stool. Around 4:30 pm pt had a measure temp of 100.2 deg F and she was brought to the ED. In the ED pt is unresponsive to sternal rub and daughter states she became less responsive on arrival to the ED. Daughter also notes that she had rhinorrhea and a sore throat this morning. As well as having difficulty eating her soup as she was have drooling on the side of her mouth, and usually pt holds on to her food. Pt has been eating food well but she does not like the thickened fluid she was started on in October due to dysphagia. Since then pt will at most drink one 8 ounce cup of thickened fluid. Denies neck stiffness, sick contacts, n/v, night sweats, seizures, and blood in stools.   In the ED pt vomited and aspirated. Yellow bile colored liquid was able to be suctioned out. Pt's sodium was found to be 180 and pt was hypotensive.  She was last admitted on 12/18/2014 for sepsis due to HCAP.   Meds: No current facility-administered medications for this encounter.   Current Outpatient Prescriptions  Medication Sig Dispense Refill  . acetaminophen (TYLENOL) 500 MG tablet Take  1,000 mg by mouth 2 (two) times daily.     Marland Kitchen albuterol (PROVENTIL) (2.5 MG/3ML) 0.083% nebulizer solution Take 2.5 mg by nebulization every 6 (six) hours as needed. For shortness of breath    . azelastine (OPTIVAR) 0.05 % ophthalmic solution Place 1 drop into both eyes 2 (two) times daily.      . budesonide (PULMICORT) 0.5 MG/2ML nebulizer solution Take 0.5 mg by nebulization 2 (two) times daily as needed. wheezing    . calcium carbonate (TUMS - DOSED IN MG ELEMENTAL CALCIUM) 500 MG chewable tablet Chew 2 tablets by mouth daily.     . Cholecalciferol (VITAMIN D3) 2000 UNITS TABS Take 2,000 Units by mouth daily.    Marland Kitchen Dextromethorphan-Guaifenesin (Q-TUSSIN DM) 10-100 MG/5ML liquid Take 10 mLs by mouth every 6 (six) hours as needed (for cough/ congestion).    . feeding supplement, ENSURE COMPLETE, (ENSURE COMPLETE) LIQD Take 237 mLs by mouth 2 (two) times daily between meals. 10 Bottle 2  . fluticasone (FLONASE) 50 MCG/ACT nasal spray Place 2 sprays into the nose 2 (two) times daily as needed. For congestion and runny nose    . magnesium hydroxide (MILK OF MAGNESIA) 400 MG/5ML suspension Take 15 mLs by mouth daily as needed for mild constipation.    Marland Kitchen  memantine (NAMENDA) 10 MG tablet Take 10 mg by mouth 2 (two) times daily.      . montelukast (SINGULAIR) 10 MG tablet Take 10 mg by mouth at bedtime.      . Multiple Vitamins-Minerals (MULTIVITAMINS THER. W/MINERALS) TABS Take 1 tablet by mouth daily.      Marland Kitchen omeprazole (PRILOSEC) 40 MG capsule Take 40 mg by mouth daily.    . predniSONE (DELTASONE) 10 MG tablet Take 10 mg by mouth daily with breakfast.    . STARCH-MALTO DEXTRIN (THICK-IT) POWD Take 1 application by mouth daily as needed (nectar consistency).    Marland Kitchen amoxicillin-clavulanate (AUGMENTIN) 500-125 MG per tablet Take 1 tablet (500 mg total) by mouth 2 (two) times daily. (Patient not taking: Reported on 05/15/2015) 10 tablet 0  . ciprofloxacin (CIPRO) 250 MG tablet Take 1 tablet (250 mg total) by  mouth 2 (two) times daily. (Patient not taking: Reported on 05/19/2015) 6 tablet 0    Allergies: Allergies as of 05/12/2015  . (No Known Allergies)   Past Medical History  Diagnosis Date  . Asthma   . Hypertension   . Osteoporosis   . GERD (gastroesophageal reflux disease)   . Pulmonary embolism     hx of 4/09, sees Dr. Melvyn Novas   . Leg edema   . DVT (deep venous thrombosis)     "?just one side"  . Pneumonia ?X1  . Asthmatic bronchitis   . Arthritis     "back mostly" (09/19/2014)  . Kidney stones   . Frequent UTI     "when she was younger" (09/19/2014)  . Dementia     "moderate to [place]; she talks to [place] in the family" (09/19/2014)   Past Surgical History  Procedure Laterality Date  . Cholecystectomy    . Lithotripsy    . Knee arthroscopy Right   . Hip fracture surgery Left   . Fracture surgery    . Carpal tunnel release Right   . Dilation and curettage of uterus     Family History  Problem Relation Age of Onset  . Arthritis Other   . Hypertension Other   . Cancer Other     lung   History   Social History  . Marital Status: Single    Spouse Name: N/A  . Number of Children: N/A  . Years of Education: N/A   Occupational History  . Not on file.   Social History Main Topics  . Smoking status: Never Smoker   . Smokeless tobacco: Never Used  . Alcohol Use: No  . Drug Use: No  . Sexual Activity: No   Other Topics Concern  . Not on file   Social History Narrative    Review of Systems: Review of systems not obtained due to patient factors.  Physical Exam: Blood pressure 88/36, pulse 101, temperature 99.2 F (37.3 C), temperature source Rectal, resp. rate 29, SpO2 97 %. Physical Exam  Constitutional: She appears well-developed.  HENT:  Mouth/Throat: Oropharynx is clear and moist.  Eyes: Conjunctivae are normal. Pupils are equal, round, and reactive to light. No scleral icterus.  Neck: Neck supple.  Cardiovascular: Regular rhythm and intact distal pulses.    Difficult to auscultate heart sounds 2/2 coarse upper airway breaths  Pulmonary/Chest: Effort normal. No respiratory distress. She has no wheezes.  Difficult to auscultate heart sounds 2/2 coarse upper airway breaths  Abdominal: Soft. Bowel sounds are normal. She exhibits no distension and no mass. There is no tenderness. There is no rebound.  Musculoskeletal: She exhibits edema (trace pedal edema).  Full ROM of rt hip, decreased ROM of left hip from prior sx   Neurological:  Unresponsive to sternal rub, withdrew to pain on left upper extremity, did not w/d to pain on rt UE, moving all 4 ext.   Skin: Skin is warm and dry. No rash noted. She is not diaphoretic.    Lab results: Basic Metabolic Panel:  Recent Labs  05/06/2015 2024  NA 180*  K 3.8  CL >130*  CO2 22  GLUCOSE 113*  BUN 35*  CREATININE 2.24*  CALCIUM 9.1   Liver Function Tests:  Recent Labs  05/07/2015 2024  AST 122*  ALT 92*  ALKPHOS 77  BILITOT 2.5*  PROT 5.6*  ALBUMIN 3.0*   CBC:  Recent Labs  05/13/2015 2024  WBC 5.1  NEUTROABS 4.4  HGB 15.9*  HCT 54.1*  MCV 101.1*  PLT 59*   Urinalysis    Component Value Date/Time   COLORURINE AMBER* 05/26/2015 2230   APPEARANCEUR CLOUDY* 05/22/2015 2230   LABSPEC 1.023 05/06/2015 2230   PHURINE 5.0 05/27/2015 2230   GLUCOSEU NEGATIVE 05/22/2015 2230   HGBUR LARGE* 05/19/2015 2230   HGBUR large 08/14/2010 1503   BILIRUBINUR NEGATIVE 05/26/2015 2230   BILIRUBINUR n 05/06/2011   KETONESUR NEGATIVE 05/29/2015 2230   PROTEINUR 30* 05/26/2015 2230   PROTEINUR n 05/06/2011   UROBILINOGEN 0.2 05/02/2015 2230   UROBILINOGEN 0.2 05/06/2011   NITRITE POSITIVE* 05/05/2015 2230   NITRITE n 05/06/2011   LEUKOCYTESUR LARGE* 05/25/2015 2230   WBC TNC Many squamous epi cells    Imaging results:  Dg Chest Port 1 View  05/12/2015   CLINICAL DATA:  Possible sepsis, hypertension, altered mental status  EXAM: PORTABLE CHEST - 1 VIEW  COMPARISON:  12/18/2014   FINDINGS: Cardiomediastinal silhouette is stable. Mild perihilar interstitial prominence without convincing pulmonary edema. There is streaky bilateral basilar atelectasis or infiltrate.  IMPRESSION: No convincing pulmonary edema. Streaky bilateral basilar atelectasis or infiltrate.   Electronically Signed   By: Lahoma Crocker M.D.   On: 05/13/2015 19:24    EKG Interpretation  Date/Time:  Thursday May 17 2015 19:08:41 EDT Ventricular Rate:  109 PR Interval:  109 QRS Duration: 64 QT Interval:  328 QTC Calculation: 442 R Axis:   37 Text Interpretation:  Sinus tachycardia Borderline ST depression, diffuse leads Abnormal T, consider ischemia, diffuse leads Confirmed by Winfred Leeds  MD, SAM ([id number]) on 05/11/2015 8:00:03 PM  T wave inversions in V6 that are more pronounced than previous EKGs.    Assessment & Plan by Problem: Active Problems:   Altered mental status  Septic shock--likely 2/2 to UTI. She did have an aspiration event while in the ED when she aspirated her vomit. Thus likely she may now develop an aspiration PNA.  LA 7.32 > 7.95 with hypotension and elevated creatinine. She had a measured temp of 102.44F in the ED. She does not have a leukocytosis. Likely septic shock due to UTI as UA w/ lg LE, + nitrites, TNTC WBCs. CXR neg for PNA. Given 1L NS bolus in the ED.  - admit to tele - cont vanc and zosyn - repeat CXR in the am - repeat UA  - 0.45 NS at 125cc /hr - comfort care - f/u urine and blood cultures - trend lactic acid  Hypernatremia-- likely due to dehydration as hgb 15.9 and hct 54.1 on admission. Denies n/v and diarrhea at home, thus hypernatremia unlikely due to GI loss. Free water  deficit is 18.3L. Likely hypernatremia is chronic as pt has decreased fluid intake since October that is likely exacerbated by infection. Will correct sodium at a maximum of 76mq/L per 24 hours.  - 0.45 NS at 125cc/hr - BMET q4h - checking urine osm  AMS-- likely 2/2 septic shock and  hypernatremia - Head CT ordered by ED provider pending - tx UTI as above and correct hypernatremia  Thrombocytopenia-- likely due to sepsis. Plts 59, previously 56 on 12/23/14 during admission for sepsis. HIT panel neg in 08/2014.  - SCDs - CBC in the am  AKI-- b/l around 1.2. Likely from sepsis and  poor po intake. - IVF as above  Mildly elevated transaminases-- AST 122 and ALT 92. Previously WNL on 12/2013. Mildy elevated at AST 45 and ALT 38 in 09/22/14 when she was admitted for sepsis. Alk phos WNL, thus unlikely acute cholangitis. Bilirubin was elevated at 2.5. Possibly due to hypoperfusion from sepsis and hypotension. LA was also elevated. However due to AMS unable to illicit any symptoms of abd pain.  - check CK level - repeat CMET - RUQ u/s to further look for source of infxn.   Diastolic CHF-- EF 753%w/ mild diastolic dysfunction on ECHO in 2010. CXR neg for pulmonary edema. Not on diuretics at home. No signs of volume overload on exam.  - caution with IVF   Asthma - cont pulmicort, singulair, and albuterol neb - on prednisone 182mdaily, will give stress dose steroids solucortef 5038mnce  Dementia-- recognizes daughter and son and depends on them for ADL.  - cont namenda [id number]  FEN - 0.45 %NS at 125cc/hr -NPO  DVT ppx-- SCDs to thrombocytopenia CODE: DNR, has a MOST form requesting only IV fluids and abx if needed. Daughter who is pt's [id number] DNR status at bedside.  Dispo: Disposition is deferred at this time, awaiting improvement of current medical problems.   The patient does have a current PCP (RobJanifer Adie) and does not need an [facility] appointment after discharge.  The patient does not have transportation limitations that hinder transportation to clinic appointments.  Signed: DianNorman Herrlich 05/19/2015, 11:34 PM

## 2015-05-17 NOTE — ED Notes (Signed)
From home via EMS: Family found minimally responsive in her room today; last seen normal 18 hours ago. She has had a cold this week. She opens eyes, but no other responses.

## 2015-05-17 NOTE — ED Provider Notes (Addendum)
1900 hrs. Level V caveat dementia history is obtained from patient's daughter who accompanies her Patient with nasal congestion and sore throat for fluid was dripping out of her mouth earlier today. She was noted at home to have a temperature 100.2 at home. She's also had a cough for the past week. On exam patient is alert and does not follow simple commands HEENT exam extremities dry oropharynx appears normal neck supple lungs clear auscultation heart regular rate and rhythm abdomen nondistended nontender without edema   10:20 PM patient sleepy arousable to noxious stimulus.. Am essentially unchanged from initial exam.. After treatment with intravenous antibiotics and intravenous normal saline. Further history from patient's daughter she is she has not been eating and drinking well for several weeks to months. She is a DO NOT RESUSCITATE CODE STATUS per my discussion with her daughter  Code sepsis called.due to hypotension, tachypnea, unknown source of infection. Results for orders placed or performed during the hospital encounter of 05/05/2015  Comprehensive metabolic panel  Result Value Ref Range   Sodium 180 (HH) 135 - 145 mmol/L   Potassium 3.8 3.5 - 5.1 mmol/L   Chloride >130 (HH) 101 - 111 mmol/L   CO2 22 22 - 32 mmol/L   Glucose, Bld 113 (H) 65 - 99 mg/dL   BUN 35 (H) 6 - 20 mg/dL   Creatinine, Ser 1.61 (H) 0.44 - 1.00 mg/dL   Calcium 9.1 8.9 - 09.6 mg/dL   Total Protein 5.6 (L) 6.5 - 8.1 g/dL   Albumin 3.0 (L) 3.5 - 5.0 g/dL   AST 045 (H) 15 - 41 U/L   ALT 92 (H) 14 - 54 U/L   Alkaline Phosphatase 77 38 - 126 U/L   Total Bilirubin 2.5 (H) 0.3 - 1.2 mg/dL   GFR calc non Af Amer 19 (L) >60 mL/min   GFR calc Af Amer 22 (L) >60 mL/min   Anion gap NOT CALCULATED 5 - 15  CBC WITH DIFFERENTIAL  Result Value Ref Range   WBC 5.1 4.0 - 10.5 K/uL   RBC 5.35 (H) 3.87 - 5.11 MIL/uL   Hemoglobin 15.9 (H) 12.0 - 15.0 g/dL   HCT 40.9 (H) 81.1 - 91.4 %   MCV 101.1 (H) 78.0 - 100.0 fL   MCH  29.7 26.0 - 34.0 pg   MCHC 29.4 (L) 30.0 - 36.0 g/dL   RDW 78.2 (H) 95.6 - 21.3 %   Platelets 59 (L) 150 - 400 K/uL   Neutrophils Relative % 87 (H) 43 - 77 %   Lymphocytes Relative 10 (L) 12 - 46 %   Monocytes Relative 3 3 - 12 %   Eosinophils Relative 0 0 - 5 %   Basophils Relative 0 0 - 1 %   nRBC 4 (H) 0 /100 WBC   Neutro Abs 4.4 1.7 - 7.7 K/uL   Lymphs Abs 0.5 (L) 0.7 - 4.0 K/uL   Monocytes Absolute 0.2 0.1 - 1.0 K/uL   Eosinophils Absolute 0.0 0.0 - 0.7 K/uL   Basophils Absolute 0.0 0.0 - 0.1 K/uL   Smear Review FEW LARGE PLATELETS SEEN ON SMEAR   I-Stat CG4 Lactic Acid, ED  (not at  [facility])  Result Value Ref Range   Lactic Acid, Venous 7.32 (HH) 0.5 - 2.0 mmol/L   Comment NOTIFIED PHYSICIAN   I-stat troponin, ED  Result Value Ref Range   Troponin i, poc 0.05 0.00 - 0.08 ng/mL   Comment 3  Dg Chest Port 1 View  05/16/2015   CLINICAL DATA:  Possible sepsis, hypertension, altered mental status  EXAM: PORTABLE CHEST - 1 VIEW  COMPARISON:  12/18/2014  FINDINGS: Cardiomediastinal silhouette is stable. Mild perihilar interstitial prominence without convincing pulmonary edema. There is streaky bilateral basilar atelectasis or infiltrate.  IMPRESSION: No convincing pulmonary edema. Streaky bilateral basilar atelectasis or infiltrate.   Electronically Signed   By: Natasha Mead M.D.   On: 05/31/2015 19:24   Chest xray viewed by me Pt has MOST form requesting iv fluids and antibiotics if indicated.   Doug Sou, MD 05/16/2015 0254  Doug Sou, MD 06/05/2015 2706

## 2015-05-17 NOTE — ED Notes (Signed)
IV team at bedside 

## 2015-05-17 NOTE — ED Provider Notes (Signed)
CSN: [id number]     Arrival date & time 06-02-2015  1845 History   First MD Initiated Contact with Patient 06/02/2015 1853     Chief Complaint  Patient presents with  . Altered Mental Status     (Consider location/radiation/quality/duration/timing/severity/associated sxs/prior Treatment) HPI Patient presents to the emergency department with altered mental status and weakness.  The family states that the patient was doing pretty well yesterday and was able to feed herself and today she became more lethargic and less responsive.  The patient's family gives me the history for the patient.  The patient is not able to give me any history whatsoever.  The family states that the patient has been having gradual decline over the last few months with not eating as well, though daughter states that the patient has not had any vomiting, diarrhea, or syncope.  The daughter states that she did develop a fever this afternoon.  That is what brought them to the hospital Past Medical History  Diagnosis Date  . Asthma   . Hypertension   . Osteoporosis   . GERD (gastroesophageal reflux disease)   . Pulmonary embolism     hx of 4/09, sees Dr. [name]   . Leg edema   . DVT (deep venous thrombosis)     "?just one side"  . Pneumonia ?X1  . Asthmatic bronchitis   . Arthritis     "back mostly" (09/19/2014)  . Kidney stones   . Frequent UTI     "when she was younger" (09/19/2014)  . Dementia     "moderate to [place]; she talks to [place] in the family" (09/19/2014)   Past Surgical History  Procedure Laterality Date  . Cholecystectomy    . Lithotripsy    . Knee arthroscopy Right   . Hip fracture surgery Left   . Fracture surgery    . Carpal tunnel release Right   . Dilation and curettage of uterus     Family History  Problem Relation Age of Onset  . Arthritis Other   . Hypertension Other   . Cancer Other     lung   History  Substance Use Topics  . Smoking status: Never Smoker   . Smokeless tobacco: Never  Used  . Alcohol Use: No   OB History    No data available     Review of Systems  Level V caveat applies due to altered mental status  Allergies  Review of patient's allergies indicates no known allergies.  Home Medications   Prior to Admission medications   Medication Sig Start Date End Date Taking? Authorizing Provider  acetaminophen (TYLENOL) 500 MG tablet Take 1,000 mg by mouth 2 (two) times daily.     Historical Provider, MD  albuterol (PROVENTIL) (2.5 MG/3ML) 0.083% nebulizer solution Take 2.5 mg by nebulization every 6 (six) hours as needed. For shortness of breath    Historical Provider, MD  amoxicillin-clavulanate (AUGMENTIN) 500-125 MG per tablet Take 1 tablet (500 mg total) by mouth 2 (two) times daily. 12/24/14   Henderson Cloud, MD  azelastine (OPTIVAR) 0.05 % ophthalmic solution Place 1 drop into both eyes 2 (two) times daily.      Historical Provider, MD  budesonide (PULMICORT) 0.5 MG/2ML nebulizer solution Take 0.5 mg by nebulization 2 (two) times daily as needed. wheezing    Historical Provider, MD  calcium carbonate (TUMS - DOSED IN MG ELEMENTAL CALCIUM) 500 MG chewable tablet Chew 1 tablet by mouth daily.     Historical  Provider, MD  Cholecalciferol (VITAMIN D3) 2000 UNITS TABS Take 2,000 Units by mouth daily.    Historical Provider, MD  ciprofloxacin (CIPRO) 250 MG tablet Take 1 tablet (250 mg total) by mouth 2 (two) times daily. 12/24/14   Henderson Cloud, MD  Dextromethorphan-Guaifenesin (Q-TUSSIN DM) 10-100 MG/5ML liquid Take 10 mLs by mouth every 6 (six) hours as needed (for cough/ congestion).    Historical Provider, MD  feeding supplement, ENSURE COMPLETE, (ENSURE COMPLETE) LIQD Take 237 mLs by mouth 2 (two) times daily between meals. 09/29/14   Su Hoff, MD  fluticasone (FLONASE) 50 MCG/ACT nasal spray Place 2 sprays into the nose 2 (two) times daily as needed. For congestion and runny nose    Historical Provider, MD  magnesium hydroxide  (MILK OF MAGNESIA) 400 MG/5ML suspension Take 15 mLs by mouth daily as needed for mild constipation.    Historical Provider, MD  memantine (NAMENDA) 10 MG tablet Take 10 mg by mouth 2 (two) times daily.      Historical Provider, MD  montelukast (SINGULAIR) 10 MG tablet Take 10 mg by mouth at bedtime.      Historical Provider, MD  Multiple Vitamins-Minerals (MULTIVITAMINS THER. W/MINERALS) TABS Take 1 tablet by mouth daily.      Historical Provider, MD  omeprazole (PRILOSEC) 40 MG capsule Take 40 mg by mouth daily.    Historical Provider, MD  predniSONE (DELTASONE) 5 MG tablet Take 5 mg by mouth daily.    Historical Provider, MD  STARCH-MALTO DEXTRIN (THICK-IT) POWD Take 1 application by mouth daily as needed (nectar consistency).    Historical Provider, MD   BP 86/52 mmHg  Pulse 106  Resp 30  SpO2 96% Physical Exam  Constitutional: She appears well-developed and well-nourished. She appears lethargic. No distress.  HENT:  Head: Normocephalic and atraumatic.  Mouth/Throat: Oropharynx is clear and moist.  Eyes: Pupils are equal, round, and reactive to light.  Cardiovascular: Normal rate, regular rhythm and normal heart sounds.  Exam reveals no gallop and no friction rub.   No murmur heard. Pulmonary/Chest: Tachypnea noted. She has decreased breath sounds. She has no wheezes. She has no rhonchi. She has no rales.  Abdominal: Soft. Bowel sounds are normal. She exhibits no distension. There is no tenderness.  Musculoskeletal: She exhibits no edema.  Neurological: She appears lethargic.  Skin: Skin is warm and dry. No rash noted. No erythema.  Nursing note and vitals reviewed.   ED Course  Procedures (including critical care time) Labs Review Labs Reviewed  CULTURE, BLOOD (ROUTINE X 2)  CULTURE, BLOOD (ROUTINE X 2)  URINE CULTURE  COMPREHENSIVE METABOLIC PANEL  CBC WITH DIFFERENTIAL/PLATELET  URINALYSIS, ROUTINE W REFLEX MICROSCOPIC (NOT AT [facility])  I-STAT CG4 LACTIC ACID, ED     Imaging Review Dg Chest Port 1 View  05/29/2015   CLINICAL DATA:  Possible sepsis, hypertension, altered mental status  EXAM: PORTABLE CHEST - 1 VIEW  COMPARISON:  12/18/2014  FINDINGS: Cardiomediastinal silhouette is stable. Mild perihilar interstitial prominence without convincing pulmonary edema. There is streaky bilateral basilar atelectasis or infiltrate.  IMPRESSION: No convincing pulmonary edema. Streaky bilateral basilar atelectasis or infiltrate.   Electronically Signed   By: Natasha Mead M.D.   On: 05/13/2015 19:24     EKG Interpretation   Date/Time:  Thursday May 17 2015 19:08:41 EDT Ventricular Rate:  109 PR Interval:  109 QRS Duration: 64 QT Interval:  328 QTC Calculation: 442 R Axis:   37 Text Interpretation:  Sinus tachycardia  Borderline ST depression, diffuse  leads Abnormal T, consider ischemia, diffuse leads Confirmed by Ethelda Chick   MD, SAM [phone]) on 13-Jun-2015 8:00:03 PM      CRITICAL CARE Performed by: Carlyle Dolly Total critical care time: 50 minutes Critical care time was exclusive of separately billable procedures and treating other patients. Critical care was necessary to treat or prevent imminent or life-threatening deterioration. Critical care was time spent personally by me on the following activities: development of treatment plan with patient and/or surrogate as well as nursing, discussions with consultants, evaluation of patient's response to treatment, examination of patient, obtaining history from patient or surrogate, ordering and performing treatments and interventions, ordering and review of laboratory studies, ordering and review of radiographic studies, pulse oximetry and re-evaluation of patient's condition.   The patient is very ill and her laboratory testing indicates this as well.  Patient is semi-responsive to voice.  The patient will be admitted to the hospital.  She does have a most form that indicates only IV fluids and  antibiotics spoke with the admitting doctors.  She will be down to evaluate the patient.  The daughter was made aware of her testing and findings.  The patient has had some delay in getting her fluids as the nurse did not initiate them due to some confusion about how he wanted to administer them.  The patient is hypotensive  Charlestine Night, PA-C 05/25/2015 0019  Doug Sou, MD 05/29/2015 [phone]

## 2015-05-17 NOTE — Progress Notes (Addendum)
ANTIBIOTIC CONSULT NOTE - INITIAL  Pharmacy Consult for vancomycin, zosyn Indication: pneumonia  No Known Allergies  Patient Measurements:   Adjusted Body Weight:   Vital Signs: BP: 86/52 mmHg (06/16 1915) Pulse Rate: 106 (06/16 1915) Intake/Output from previous day:   Intake/Output from this shift:    Labs: No results for input(s): WBC, HGB, PLT, LABCREA, CREATININE in the last 72 hours. CrCl cannot be calculated (Unknown ideal weight.). No results for input(s): VANCOTROUGH, VANCOPEAK, VANCORANDOM, GENTTROUGH, GENTPEAK, GENTRANDOM, TOBRATROUGH, TOBRAPEAK, TOBRARND, AMIKACINPEAK, AMIKACINTROU, AMIKACIN in the last 72 hours.   Microbiology: No results found for this or any previous visit (from the past 720 hour(s)).  Medical History: Past Medical History  Diagnosis Date  . Asthma   . Hypertension   . Osteoporosis   . GERD (gastroesophageal reflux disease)   . Pulmonary embolism     hx of 4/09, sees Dr. [name]   . Leg edema   . DVT (deep venous thrombosis)     "?just one side"  . Pneumonia ?X1  . Asthmatic bronchitis   . Arthritis     "back mostly" (09/19/2014)  . Kidney stones   . Frequent UTI     "when she was younger" (09/19/2014)  . Dementia     "moderate to [place]; she talks to [place] in the family" (09/19/2014)    Medications:  See EMR  Assessment: 81 yo female found minimally responsive. Initiating abx for possible pneumonia.  LA 7.3, dehydrated, afebrile, wbc ok, bump in Scr, CrCl 20-25 ml/min.  Goal of Therapy:  Vancomycin trough level 15-20 mcg/ml  Plan:  Zosyn 3.375 g IV q8h -- watch for dose adjustment  Vancomycin 1250 mg IV x1 then 750/24h  Monitor renal fx, cultures, duration of therapy, VT at [facility]   Agapito Games, PharmD, BCPS Clinical Pharmacist Pager: [phone] May 23, 2015 8:05 PM      ADDENDUM:  CrCl has dropped to <20 ml/min in setting of AKI; will change Zosyn to 2.25g IV Q6H and continue to monitor. Vernard Gambles, PharmD, BCPS 05/04/2015  5:34 AM

## 2015-05-18 ENCOUNTER — Inpatient Hospital Stay (HOSPITAL_COMMUNITY): Payer: Medicare (Managed Care)

## 2015-05-18 ENCOUNTER — Encounter (HOSPITAL_COMMUNITY): Payer: Self-pay | Admitting: *Deleted

## 2015-05-18 DIAGNOSIS — F0390 Unspecified dementia without behavioral disturbance: Secondary | ICD-10-CM | POA: Diagnosis present

## 2015-05-18 DIAGNOSIS — E870 Hyperosmolality and hypernatremia: Secondary | ICD-10-CM

## 2015-05-18 DIAGNOSIS — J80 Acute respiratory distress syndrome: Secondary | ICD-10-CM

## 2015-05-18 DIAGNOSIS — Z66 Do not resuscitate: Secondary | ICD-10-CM | POA: Diagnosis present

## 2015-05-18 DIAGNOSIS — N390 Urinary tract infection, site not specified: Secondary | ICD-10-CM

## 2015-05-18 DIAGNOSIS — M810 Age-related osteoporosis without current pathological fracture: Secondary | ICD-10-CM | POA: Diagnosis present

## 2015-05-18 DIAGNOSIS — R740 Nonspecific elevation of levels of transaminase and lactic acid dehydrogenase [LDH]: Secondary | ICD-10-CM | POA: Diagnosis present

## 2015-05-18 DIAGNOSIS — R0902 Hypoxemia: Secondary | ICD-10-CM | POA: Diagnosis not present

## 2015-05-18 DIAGNOSIS — I10 Essential (primary) hypertension: Secondary | ICD-10-CM | POA: Diagnosis present

## 2015-05-18 DIAGNOSIS — D6959 Other secondary thrombocytopenia: Secondary | ICD-10-CM | POA: Diagnosis present

## 2015-05-18 DIAGNOSIS — R1310 Dysphagia, unspecified: Secondary | ICD-10-CM | POA: Diagnosis present

## 2015-05-18 DIAGNOSIS — N179 Acute kidney failure, unspecified: Secondary | ICD-10-CM | POA: Diagnosis present

## 2015-05-18 DIAGNOSIS — J45909 Unspecified asthma, uncomplicated: Secondary | ICD-10-CM | POA: Diagnosis present

## 2015-05-18 DIAGNOSIS — Z515 Encounter for palliative care: Secondary | ICD-10-CM | POA: Diagnosis not present

## 2015-05-18 DIAGNOSIS — R400 Somnolence: Secondary | ICD-10-CM

## 2015-05-18 DIAGNOSIS — E872 Acidosis: Secondary | ICD-10-CM | POA: Diagnosis present

## 2015-05-18 DIAGNOSIS — A419 Sepsis, unspecified organism: Secondary | ICD-10-CM | POA: Diagnosis present

## 2015-05-18 DIAGNOSIS — I5032 Chronic diastolic (congestive) heart failure: Secondary | ICD-10-CM | POA: Diagnosis present

## 2015-05-18 DIAGNOSIS — Z86711 Personal history of pulmonary embolism: Secondary | ICD-10-CM | POA: Diagnosis not present

## 2015-05-18 DIAGNOSIS — Z79899 Other long term (current) drug therapy: Secondary | ICD-10-CM | POA: Diagnosis not present

## 2015-05-18 DIAGNOSIS — B9689 Other specified bacterial agents as the cause of diseases classified elsewhere: Secondary | ICD-10-CM

## 2015-05-18 DIAGNOSIS — J690 Pneumonitis due to inhalation of food and vomit: Secondary | ICD-10-CM | POA: Diagnosis present

## 2015-05-18 DIAGNOSIS — Z7952 Long term (current) use of systemic steroids: Secondary | ICD-10-CM | POA: Diagnosis not present

## 2015-05-18 DIAGNOSIS — Z86718 Personal history of other venous thrombosis and embolism: Secondary | ICD-10-CM | POA: Diagnosis not present

## 2015-05-18 DIAGNOSIS — E869 Volume depletion, unspecified: Secondary | ICD-10-CM | POA: Diagnosis present

## 2015-05-18 DIAGNOSIS — R404 Transient alteration of awareness: Secondary | ICD-10-CM | POA: Diagnosis present

## 2015-05-18 DIAGNOSIS — E860 Dehydration: Secondary | ICD-10-CM | POA: Diagnosis present

## 2015-05-18 DIAGNOSIS — R6521 Severe sepsis with septic shock: Secondary | ICD-10-CM | POA: Diagnosis present

## 2015-05-18 LAB — BASIC METABOLIC PANEL
BUN: 34 mg/dL — ABNORMAL HIGH (ref 6–20)
BUN: 34 mg/dL — AB (ref 6–20)
BUN: 36 mg/dL — ABNORMAL HIGH (ref 6–20)
CALCIUM: 8.2 mg/dL — AB (ref 8.9–10.3)
CALCIUM: 7.9 mg/dL — AB (ref 8.9–10.3)
CALCIUM: 7.9 mg/dL — AB (ref 8.9–10.3)
CO2: 16 mmol/L — AB (ref 22–32)
CO2: 15 mmol/L — ABNORMAL LOW (ref 22–32)
CO2: 15 mmol/L — ABNORMAL LOW (ref 22–32)
CREATININE: 2.06 mg/dL — AB (ref 0.44–1.00)
Chloride: >130 mmol/L (ref 101–111)
Chloride: >130 mmol/L (ref 101–111)
Creatinine, Ser: 2.26 mg/dL — ABNORMAL HIGH (ref 0.44–1.00)
Creatinine, Ser: 2.54 mg/dL — ABNORMAL HIGH (ref 0.44–1.00)
GFR calc Af Amer: 24 mL/min — ABNORMAL LOW (ref >60)
GFR calc Af Amer: 22 mL/min — ABNORMAL LOW (ref >60)
GFR calc non Af Amer: 21 mL/min — ABNORMAL LOW (ref >60)
GFR calc non Af Amer: 19 mL/min — ABNORMAL LOW (ref >60)
GFR calc non Af Amer: 16 mL/min — ABNORMAL LOW (ref >60)
GFR, EST AFRICAN AMERICAN: 19 mL/min — AB (ref >60)
GLUCOSE: 102 mg/dL — AB (ref 65–99)
GLUCOSE: 62 mg/dL — AB (ref 65–99)
GLUCOSE: 51 mg/dL — AB (ref 65–99)
Potassium: 4.1 mmol/L (ref 3.5–5.1)
Potassium: 3.4 mmol/L — ABNORMAL LOW (ref 3.5–5.1)
Potassium: 3.5 mmol/L (ref 3.5–5.1)
SODIUM: 172 mmol/L — AB (ref 135–145)
Sodium: 177 mmol/L (ref 135–145)
Sodium: 175 mmol/L (ref 135–145)

## 2015-05-18 LAB — HEPATIC FUNCTION PANEL
ALT: 60 U/L — AB (ref 14–54)
AST: 71 U/L — ABNORMAL HIGH (ref 15–41)
Albumin: 2.2 g/dL — ABNORMAL LOW (ref 3.5–5.0)
Alkaline Phosphatase: 52 U/L (ref 38–126)
BILIRUBIN DIRECT: 0.4 mg/dL (ref 0.1–0.5)
BILIRUBIN INDIRECT: 1.9 mg/dL — AB (ref 0.3–0.9)
BILIRUBIN TOTAL: 2.3 mg/dL — AB (ref 0.3–1.2)
Total Protein: 4.2 g/dL — ABNORMAL LOW (ref 6.5–8.1)

## 2015-05-18 LAB — MRSA PCR SCREENING: MRSA by PCR: NEGATIVE

## 2015-05-18 LAB — URIC ACID: Uric Acid, Serum: 6.3 mg/dL (ref 2.3–6.6)

## 2015-05-18 LAB — GLUCOSE, CAPILLARY
Glucose-Capillary: 112 mg/dL — ABNORMAL HIGH (ref 65–99)
Glucose-Capillary: 67 mg/dL (ref 65–99)

## 2015-05-18 LAB — LIPASE, BLOOD: Lipase: 15 U/L — ABNORMAL LOW (ref 22–51)

## 2015-05-18 LAB — CLOSTRIDIUM DIFFICILE BY PCR: CDIFFPCR: NEGATIVE

## 2015-05-18 LAB — LACTIC ACID, PLASMA
Lactic Acid, Venous: 10.3 mmol/L (ref 0.5–2.0)
Lactic Acid, Venous: 8.9 mmol/L (ref 0.5–2.0)

## 2015-05-18 LAB — CK: CK TOTAL: 258 U/L — AB (ref 38–234)

## 2015-05-18 MED ORDER — MONTELUKAST SODIUM 10 MG PO TABS
10.0000 mg | ORAL_TABLET | Freq: Every day | ORAL | Status: DC
  Filled 2015-05-18: qty 1

## 2015-05-18 MED ORDER — SODIUM CHLORIDE 0.9 % IJ SOLN
3.0000 mL | Freq: Two times a day (BID) | INTRAMUSCULAR | Status: DC
  Administered 2015-05-18: 3 mL via INTRAVENOUS

## 2015-05-18 MED ORDER — DEXTROSE 5 % IV SOLN
500.0000 mg | Freq: Once | INTRAVENOUS | Status: AC
  Administered 2015-05-18: 500 mg via INTRAVENOUS
  Filled 2015-05-18 (×2): qty 500

## 2015-05-18 MED ORDER — DEXTROSE 50 % IV SOLN
INTRAVENOUS | Status: AC
  Filled 2015-05-18: qty 50

## 2015-05-18 MED ORDER — PIPERACILLIN-TAZOBACTAM IN DEX 2-0.25 GM/50ML IV SOLN
2.2500 g | Freq: Four times a day (QID) | INTRAVENOUS | Status: DC
  Administered 2015-05-18: 2.25 g via INTRAVENOUS
  Filled 2015-05-18 (×7): qty 50

## 2015-05-18 MED ORDER — SODIUM CHLORIDE 0.9 % IV SOLN
INTRAVENOUS | Status: DC
  Administered 2015-05-18: 12:00:00 via INTRAVENOUS

## 2015-05-18 MED ORDER — ACETAMINOPHEN 650 MG RE SUPP
650.0000 mg | Freq: Four times a day (QID) | RECTAL | Status: DC | PRN
  Administered 2015-05-18: 650 mg via RECTAL
  Filled 2015-05-18: qty 1

## 2015-05-18 MED ORDER — HYDROCORTISONE NA SUCCINATE PF 100 MG IJ SOLR
50.0000 mg | Freq: Three times a day (TID) | INTRAMUSCULAR | Status: DC
  Filled 2015-05-18: qty 1

## 2015-05-18 MED ORDER — ALBUTEROL SULFATE (2.5 MG/3ML) 0.083% IN NEBU: 2.5000 mg | INHALATION_SOLUTION | Freq: Four times a day (QID) | RESPIRATORY_TRACT | Status: DC | PRN

## 2015-05-18 MED ORDER — SODIUM CHLORIDE 0.9 % IV BOLUS (SEPSIS)
500.0000 mL | Freq: Once | INTRAVENOUS | Status: AC
  Administered 2015-05-18: 500 mL via INTRAVENOUS

## 2015-05-18 MED ORDER — SODIUM CHLORIDE 0.9 % IV BOLUS (SEPSIS)
250.0000 mL | Freq: Once | INTRAVENOUS | Status: AC
  Administered 2015-05-18: 250 mL via INTRAVENOUS

## 2015-05-18 MED ORDER — SODIUM CHLORIDE 0.9 % IV BOLUS (SEPSIS): 250.0000 mL | Freq: Once | INTRAVENOUS | Status: DC

## 2015-05-18 MED ORDER — ACETAMINOPHEN 650 MG RE SUPP: 650.0000 mg | RECTAL | Status: DC | PRN

## 2015-05-18 MED ORDER — HYDROCORTISONE NA SUCCINATE PF 100 MG IJ SOLR: 25.0000 mg | Freq: Three times a day (TID) | INTRAMUSCULAR | Status: DC

## 2015-05-18 MED ORDER — DEXTROSE 50 % IV SOLN
1.0000 | Freq: Once | INTRAVENOUS | Status: AC
  Administered 2015-05-18: 50 mL via INTRAVENOUS
  Filled 2015-05-18: qty 50

## 2015-05-18 MED ORDER — DEXTROSE 50 % IV SOLN
INTRAVENOUS | Status: AC
  Administered 2015-05-18: 25 mL
  Filled 2015-05-18: qty 50

## 2015-05-18 MED ORDER — SODIUM CHLORIDE 0.45 % IV SOLN
INTRAVENOUS | Status: DC
  Administered 2015-05-18: 01:00:00 via INTRAVENOUS

## 2015-05-18 MED ORDER — MORPHINE SULFATE 2 MG/ML IJ SOLN: 0.5000 mg | INTRAMUSCULAR | Status: DC | PRN

## 2015-05-18 MED ORDER — BUDESONIDE 0.5 MG/2ML IN SUSP
0.5000 mg | Freq: Two times a day (BID) | RESPIRATORY_TRACT | Status: DC
  Filled 2015-05-18 (×5): qty 2

## 2015-05-18 MED ORDER — MEMANTINE HCL 10 MG PO TABS
10.0000 mg | ORAL_TABLET | Freq: Two times a day (BID) | ORAL | Status: DC
  Filled 2015-05-18 (×4): qty 1

## 2015-05-18 MED ORDER — HYDROCORTISONE NA SUCCINATE PF 100 MG IJ SOLR
50.0000 mg | Freq: Once | INTRAMUSCULAR | Status: AC
  Administered 2015-05-18: 50 mg via INTRAVENOUS
  Filled 2015-05-18: qty 1

## 2015-05-18 MED ORDER — HYDROCORTISONE NA SUCCINATE PF 100 MG IJ SOLR
25.0000 mg | Freq: Three times a day (TID) | INTRAMUSCULAR | Status: DC
  Administered 2015-05-18 (×2): 25 mg via INTRAVENOUS
  Filled 2015-05-18 (×4): qty 0.5

## 2015-05-18 MED ORDER — VANCOMYCIN HCL IN DEXTROSE 750-5 MG/150ML-% IV SOLN
750.0000 mg | INTRAVENOUS | Status: DC
  Filled 2015-05-18 (×2): qty 150

## 2015-05-18 MED ORDER — FUROSEMIDE 10 MG/ML IJ SOLN
INTRAMUSCULAR | Status: AC
  Filled 2015-05-18: qty 2

## 2015-05-19 LAB — URINE CULTURE

## 2015-05-19 LAB — HEPATITIS PANEL, ACUTE
HEP B S AG: NEGATIVE
Hep A IgM: NEGATIVE
Hep B C IgM: NEGATIVE

## 2015-05-21 NOTE — Progress Notes (Signed)
Patient ID: [name], female   DOB: 06-11-1929, 81 y.o.   MRN: [id number] Medicine attending death summary: 81 year old woman with advanced dementia, hypertension, and asthma. She is on chronic prednisone for the asthma. She had a change in her baseline mental and functional status reported by her daughter with increasing somnolence, decreased oral intake, drooling of fluids from her mouth. She took her temperature which was 100.2. She was brought to the emergency department for further evaluation. She was poorly responsive except to painful stimuli. Laboratory studies showed a number of major abnormalities with a markedly elevated sodium of 180, chloride greater than 130, BUN 35, creatinine 2.2, lactic acid of 7.3, elevated transaminases and bilirubin, hemoconcentration with hemoglobin 15.9, hematocrit 54. White count 5100 with 87% neutrophils. Platelets 59,000 with previous baseline 56,000 from January of this year. Urine appeared grossly infected with too numerous to count white cells and positive nitrite. CT scan of the brain with no evidence for mass lesion or hemorrhage. No acute stroke. Chest x-ray with no gross infiltrate or effusion. CT scan of the neck was extended to include the lungs and showed parenchymal densities in bilateral upper lobes and superior segment left lower lobe rule out pneumonia. Of note the patient vomited and aspirated while in the emergency department.  Hospital course: She was given aggressive fluid resuscitation and broad-spectrum parenteral antibiotics. Initial urine culture likely contaminated showing multiple species. One of 2 sets of blood cultures grew coag negative staph, likely a contaminant. Despite  negative cultures, she was clearly septic with hypotension and lactic acidosis. Unfortunately her condition failed to stabilize. She remained hypotensive. She failed to respond to large volumes of IV fluids. She was unable to clear respiratory secretions. She  became increasingly hypoxic. Chest radiograph showed progressive basilar infiltrates with associated effusions. We had ongoing discussions with the family. One of her daughters was the main spokesperson. She offered that her mother had been through a lot and family did not want aggressive intensive care unit support or mechanical ventilation. At 7:15 PM on 05/29/2015 cardiac monitor showed asystole. Exam of the patient showed absent blood pressure, pulse, or spontaneous respiration. She was pronounced dead.  Impression: #1. Culture-negative urosepsis #2. Likely aspiration pneumonia  Disposition: The patient died

## 2015-05-22 LAB — CULTURE, BLOOD (ROUTINE X 2): CULTURE: NO GROWTH

## 2015-05-23 LAB — CULTURE, BLOOD (ROUTINE X 2)

## 2015-05-25 NOTE — Discharge Summary (Signed)
Name: [name] MRN: [id number] DOB: 1929-01-20 81 y.o. PCP: Jethro Bastos, MD ______________________________________________________________  Date of Admission: 06/02/2015  6:45 PM Date of Death: 06/03/2015 Attending Physician: Cephas Darby, MD   Diagnosis: Patient Active Problem List   Diagnosis Date Noted  . Sepsis due to urinary tract infection 06-03-15  . UTI (urinary tract infection) 12/22/2014  . Severe sepsis   . Sepsis 12/18/2014  . HCAP (healthcare-associated pneumonia) 12/18/2014  . Thrombocytopenia 09/24/2014  . DNR (do not resuscitate) 09/21/2014  . Palliative care encounter 09/21/2014  . Weakness generalized 09/21/2014  . AKI (acute kidney injury) 09/20/2014  . Chronic diastolic heart failure 09/20/2014  . Septic shock 09/19/2014  . Urinary tract infection 11/05/2011  . SAH (subarachnoid hemorrhage) 11/05/2011  . Hypernatremia 11/05/2011  . Renal failure, acute 11/05/2011  . Leukocytosis 11/05/2011  . Altered mental status 11/03/2011  . CONTUSION OF BUTTOCK 04/30/2010  . HEMORRHOIDS, EXTERNAL 01/30/2010  . MICROSCOPIC HEMATURIA 01/30/2010  . ALLERGIC CONJUNCTIVITIS 04/27/2009  . ALLERGIC RHINITIS 04/27/2009  . ABDOMINAL PAIN, GENERALIZED 04/02/2009  . ABRASION, KNEE 04/02/2009  . PULMONARY EDEMA 12/15/2008  . VENOUS EMBO&THROMB DEEP VES PROX LOWER EXTREM 08/21/2008  . LEG EDEMA 08/21/2008  . KNEE SPRAIN 07/10/2008  . ACUTE CYSTITIS 04/07/2008  . PULMONARY EMBOLISM, HX OF 03/31/2008  . DEMENTIA 12/20/2007  . GERD 12/20/2007  . Irritable bowel syndrome 11/08/2007  . GASTRIC ULCER, HX OF 11/08/2007  . RENAL CALCULUS, HX OF 11/08/2007  . CHICKENPOX, HX OF 11/08/2007  . HYPERTENSION 09/30/2007  . ASTHMATIC BRONCHITIS, ACUTE 09/30/2007  . Asthma 09/30/2007  . OSTEOPOROSIS 09/30/2007     Medications:   Medication List    ASK your doctor about these medications        acetaminophen 500 MG tablet  Commonly known as:  TYLENOL    Take 1,000 mg by mouth 2 (two) times daily.     amoxicillin-clavulanate 500-125 MG per tablet  Commonly known as:  AUGMENTIN  Take 1 tablet (500 mg total) by mouth 2 (two) times daily.     azelastine 0.05 % ophthalmic solution  Commonly known as:  OPTIVAR  Place 1 drop into both eyes 2 (two) times daily.     budesonide 0.5 MG/2ML nebulizer solution  Commonly known as:  PULMICORT  Take 0.5 mg by nebulization 2 (two) times daily as needed. wheezing     calcium carbonate 500 MG chewable tablet  Commonly known as:  TUMS - dosed in mg elemental calcium  Chew 2 tablets by mouth daily.     ciprofloxacin 250 MG tablet  Commonly known as:  CIPRO  Take 1 tablet (250 mg total) by mouth 2 (two) times daily.     feeding supplement (ENSURE COMPLETE) Liqd  Take 237 mLs by mouth 2 (two) times daily between meals.     fluticasone 50 MCG/ACT nasal spray  Commonly known as:  FLONASE  Place 2 sprays into the nose 2 (two) times daily as needed. For congestion and runny nose     magnesium hydroxide 400 MG/5ML suspension  Commonly known as:  MILK OF MAGNESIA  Take 15 mLs by mouth daily as needed for mild constipation.     memantine 10 MG tablet  Commonly known as:  NAMENDA  Take 10 mg by mouth 2 (two) times daily.     montelukast 10 MG tablet  Commonly known as:  SINGULAIR  Take 10 mg by mouth at bedtime.     multivitamins ther. w/minerals Tabs tablet  Take 1 tablet by mouth daily.     omeprazole 40 MG capsule  Commonly known as:  PRILOSEC  Take 40 mg by mouth daily.     predniSONE 10 MG tablet  Commonly known as:  DELTASONE  Take 10 mg by mouth daily with breakfast.     PROVENTIL (2.5 MG/3ML) 0.083% nebulizer solution  Generic drug:  albuterol  Take 2.5 mg by nebulization every 6 (six) hours as needed. For shortness of breath     Q-TUSSIN DM 10-100 MG/5ML liquid  Generic drug:  Dextromethorphan-Guaifenesin  Take 10 mLs by mouth every 6 (six) hours as needed (for cough/  congestion).     THICK-IT Powd  Generic drug:  STARCH-MALTO DEXTRIN  Take 1 application by mouth daily as needed (nectar consistency).     Vitamin D3 2000 UNITS Tabs  Take 2,000 Units by mouth daily.        Disposition:   Ms.Markita L Upton expired on 05/07/2015 while at [facility].  Consultations:    Procedures Performed:  Ct Head Wo Contrast  05/27/2015   EXAM: CT HEAD WITHOUT CONTRAST  CT NECK WITHOUT CONTRAST  TECHNIQUE: Contiguous axial images were obtained from the base of the skull through the vertex without contrast. Multidetector CT imaging of the neck was performed using the standard protocol without intravenous contrast.  COMPARISON:  Prior CT from 12/18/2014  FINDINGS: CT HEAD FINDINGS  Patient is rotated in the gantry, somewhat limiting evaluation.  Diffuse prominence of the CSF containing spaces is compatible with generalized age-related cerebral atrophy. Extensive small vessel changes present within the periventricular deep white matter. Probable small remote cortical infarct present within the high left frontal lobe anteriorly.  No acute large vessel territory infarct. No acute intracranial hemorrhage. No mass lesion, midline shift, or mass effect. Ventricular prominence related to global parenchymal volume loss present without hydrocephalus.  Scalp soft tissues within normal limits. No acute abnormality about the orbits.  Calvarium intact. Few opacified right mass frontal air cells noted. There is scattered opacity within the ethmoidal air cells. Minimal layering opacity within the sphenoid sinuses and left maxillary sinus.  CT NECK FINDINGS  Patient is rotated in the gantry, somewhat limiting evaluation.  Small mild layering opacity present within the left maxillary sinus.  The salivary glands including the parotid glands and submandibular glands are within normal limits. Oral cavity is normal. Palatine tonsils within normal limits. Parapharyngeal fat is  preserved. Oropharynx and nasopharynx within normal limits. No retropharyngeal fluid collection. Hypopharynx and supraglottic larynx are normal. Epiglottis normal. Vallecula is clear. True vocal cords are symmetric and normal bilaterally. Subglottic airway is clear.  Subcentimeter hypodense nodule noted within the right lobe of thyroid, of doubtful clinical significance. No pathologically enlarged lymph nodes within the neck. No supraclavicular adenopathy.  Visualized superior mediastinum within normal limits.  Prominent atheromatous plaque present within the aortic arch. Atheromatous disease present about the carotid bifurcations as well.  Small layering bilateral pleural effusions noted. Scattered parenchymal opacities present within the partially visualized upper lobes as well as the superior segment of the left lower lobe, suspicious for possible pneumonia.  Advanced multilevel degenerative disc disease within the visualized cervical spine. No acute osseous abnormality. No worrisome lytic or blastic osseous lesions. Heterogeneous calcification adjacent to the right first rib noted, which may be related to remote trauma.  IMPRESSION: CT HEAD IMPRESSION:  1. No acute intracranial process. 2. Advanced age-related cerebral atrophy with chronic microvascular ischemic disease. 3. Mild left maxillary and  bilateral sphenoid sinus disease.  CT NECK IMPRESSION:  1. No acute abnormality identified within the neck. No inflammatory process or adenopathy. 2. Patchy multi focal opacities within the partially visualized lungs, suspicious for possible multi focal pneumonia. 3. Small layering bilateral pleural effusions, incompletely visualized.   Electronically Signed   By: Rise Mu M.D.   On: 05/27/2015 03:10   Ct Soft Tissue Neck Wo Contrast  05/07/2015   EXAM: CT HEAD WITHOUT CONTRAST  CT NECK WITHOUT CONTRAST  TECHNIQUE: Contiguous axial images were obtained from the base of the skull through the vertex  without contrast. Multidetector CT imaging of the neck was performed using the standard protocol without intravenous contrast.  COMPARISON:  Prior CT from 12/18/2014  FINDINGS: CT HEAD FINDINGS  Patient is rotated in the gantry, somewhat limiting evaluation.  Diffuse prominence of the CSF containing spaces is compatible with generalized age-related cerebral atrophy. Extensive small vessel changes present within the periventricular deep white matter. Probable small remote cortical infarct present within the high left frontal lobe anteriorly.  No acute large vessel territory infarct. No acute intracranial hemorrhage. No mass lesion, midline shift, or mass effect. Ventricular prominence related to global parenchymal volume loss present without hydrocephalus.  Scalp soft tissues within normal limits. No acute abnormality about the orbits.  Calvarium intact. Few opacified right mass frontal air cells noted. There is scattered opacity within the ethmoidal air cells. Minimal layering opacity within the sphenoid sinuses and left maxillary sinus.  CT NECK FINDINGS  Patient is rotated in the gantry, somewhat limiting evaluation.  Small mild layering opacity present within the left maxillary sinus.  The salivary glands including the parotid glands and submandibular glands are within normal limits. Oral cavity is normal. Palatine tonsils within normal limits. Parapharyngeal fat is preserved. Oropharynx and nasopharynx within normal limits. No retropharyngeal fluid collection. Hypopharynx and supraglottic larynx are normal. Epiglottis normal. Vallecula is clear. True vocal cords are symmetric and normal bilaterally. Subglottic airway is clear.  Subcentimeter hypodense nodule noted within the right lobe of thyroid, of doubtful clinical significance. No pathologically enlarged lymph nodes within the neck. No supraclavicular adenopathy.  Visualized superior mediastinum within normal limits.  Prominent atheromatous plaque present  within the aortic arch. Atheromatous disease present about the carotid bifurcations as well.  Small layering bilateral pleural effusions noted. Scattered parenchymal opacities present within the partially visualized upper lobes as well as the superior segment of the left lower lobe, suspicious for possible pneumonia.  Advanced multilevel degenerative disc disease within the visualized cervical spine. No acute osseous abnormality. No worrisome lytic or blastic osseous lesions. Heterogeneous calcification adjacent to the right first rib noted, which may be related to remote trauma.  IMPRESSION: CT HEAD IMPRESSION:  1. No acute intracranial process. 2. Advanced age-related cerebral atrophy with chronic microvascular ischemic disease. 3. Mild left maxillary and bilateral sphenoid sinus disease.  CT NECK IMPRESSION:  1. No acute abnormality identified within the neck. No inflammatory process or adenopathy. 2. Patchy multi focal opacities within the partially visualized lungs, suspicious for possible multi focal pneumonia. 3. Small layering bilateral pleural effusions, incompletely visualized.   Electronically Signed   By: Rise Mu M.D.   On: 05/10/2015 03:10   Dg Chest Port 1 View  05/06/2015   CLINICAL DATA:  Hypoxia  EXAM: PORTABLE CHEST - 1 VIEW  COMPARISON:  05-19-2015  FINDINGS: Cardiac shadow is enlarged but stable from the prior study. Increasing bibasilar infiltrates are noted with associated effusions. Vascular congestion remains. No bony  abnormality is noted.  IMPRESSION: Increasing bibasilar infiltrates and effusions.   Electronically Signed   By: Alcide Clever M.D.   On: May 23, 2015 15:27   Dg Chest Port 1 View  05/20/2015   CLINICAL DATA:  Possible sepsis, hypertension, altered mental status  EXAM: PORTABLE CHEST - 1 VIEW  COMPARISON:  12/18/2014  FINDINGS: Cardiomediastinal silhouette is stable. Mild perihilar interstitial prominence without convincing pulmonary edema. There is streaky  bilateral basilar atelectasis or infiltrate.  IMPRESSION: No convincing pulmonary edema. Streaky bilateral basilar atelectasis or infiltrate.   Electronically Signed   By: Natasha Mead M.D.   On: 05/22/2015 19:24   US Abdomen Limited Ruq  05/23/15   CLINICAL DATA:  RIGHT upper quadrant pain. Previous cholecystectomy. Hypertension. Dementia. Altered mental status.  EXAM: US ABDOMEN LIMITED - RIGHT UPPER QUADRANT  COMPARISON:  None.  FINDINGS: Gallbladder:  Cholecystectomy.  Common bile duct:  Diameter: 4 mm.  Normal.  Liver:  Suboptimally visualized. No intrahepatic biliary ductal dilation. No mass lesion.  IMPRESSION: Cholecystectomy.  No acute abnormality.   Electronically Signed   By: Andreas Newport M.D.   On: 05-23-15 07:30    2D Echo: N/A  Cardiac Cath: N/A  Admission HPI:  Pt is a 81 y/o F w/ PMHx of HTN, dementia, and asthma who presents with AMS. Hx obtained from pt's daughter who is her HCPOA. Daughter says that she was her usual self this morning until around 2:30pm. Around 3:00pm daughter states she saw pt straining to have a bowel movement which was stiff as clay w/normal brown color to stool. Around 4:30 pm pt had a measure temp of 100.2 deg F and she was brought to the ED. In the ED pt is unresponsive to sternal rub and daughter states she became less responsive on arrival to the ED. Daughter also notes that she had rhinorrhea and a sore throat this morning. As well as having difficulty eating her soup as she was have drooling on the side of her mouth, and usually pt holds on to her food. Pt has been eating food well but she does not like the thickened fluid she was started on in October due to dysphagia. Since then pt will at most drink one 8 ounce cup of thickened fluid. Denies neck stiffness, sick contacts, n/v, night sweats, seizures, and blood in stools.   In the ED pt vomited and aspirated. Yellow bile colored liquid was able to be suctioned out. Pt's sodium was found to be 180  and pt was hypotensive. She was last admitted on 12/18/2014 for sepsis due to HCAP.   Original H&P by Gara Kroner, MD  Hospital Course by problem list: Principal Problem:   Sepsis due to urinary tract infection Active Problems:   Altered mental status   Hypernatremia   AKI (acute kidney injury)   Thrombocytopenia    Septic shock due to UTI Pt was hypotensive upon arrival with a very elevated lactic acid.  She was given aggressive IVF for resuscitation and broad-spectrum parenteral antibiotics. Urine culture showing multiple species so likely contaminant.  1/2 blood cultures grew coag negative staph, likely also a contaminant.  Unfortunately her condition deteriorated and remained hypotensive despite IVF.  She did not want aggressive measures such as pressors or other heroic measures.  She also had difficulty clearing her secretions and was suctioned several times.  We were paged by the RN that she had become increasingly hypoxic and short of breath.  CXR at that time showed progressive basilar  infiltrates with associated effusions.  We had ongoing discussions with the family and her daughter who understood the gravity of the situation.  Chaplain was also consulted.  Her daughter offered that her mother had been through a lot and family did not want aggressive intensive care unit support or mechanical ventilation.  At 7:15 PM on 29-May-2015 cardiac monitor showed asystole. Exam of the patient showed absent blood pressure, pulse, or spontaneous respiration. She was pronounced dead.  Dementia Likely contributed to her death.  Hypernatremia Present on admission and likely due to decreased po intake.    Acute Kidney Injury Likely due to severe sepsis/MODS and decreased po intake.    Thrombocytopenia Likely due to severe sepsis.    Elevated transaminases Likely due to severe sepsis/MODS.     Vitals:   BP 59/38 mmHg  Pulse 77  Temp(Src) 99.2 F (37.3 C) (Oral)  Resp 0  Ht 4\' 9"   (1.448 m)  Wt 107 lb 9.4 oz (48.8 kg)  BMI 23.27 kg/m2  SpO2 92%  Signed: Marrian Salvage, MD 05/25/2015, 11:28 AM

## 2015-06-01 NOTE — Progress Notes (Signed)
CRITICAL VALUE ALERT  Critical value received:  Sodium 172 , Chloride >130  Date of notification:  06-03-15  Time of notification:  1040  Critical value read back:Yes.    Nurse who received alert:  Lora Paula, RN  MD notified (1st page):  [place]  Time of first page:  1050  MD notified (2nd page):  Time of second page:  Responding MD:  Ahmed  Time MD responded:  1059

## 2015-06-01 NOTE — Progress Notes (Signed)
Bolus completed, BP remains low at 60/30. Kooper Chriswell, Student-RN

## 2015-06-01 NOTE — Progress Notes (Signed)
B/p 58/40; p-107   02 sat 92% Resp shallow.

## 2015-06-01 NOTE — Progress Notes (Signed)
Condtion unchanged 02 sa 93% hr 109; b/p 58/40

## 2015-06-01 NOTE — Progress Notes (Signed)
Subjective: Patient was hypotensive O/N, so she was transferred to step down. BP still low this morning to 63/29. Patient remains somnolent, but is arousable on exam.  Objective: Vital signs in last 24 hours: Filed Vitals:   05/14/2015 0317 05/20/2015 0437 05/29/2015 0650 05/28/2015 0830  BP:  71/41 66/51 63/29   Pulse:  109 108 107  Temp: 102.3 F (39.1 C) 101.1 F (38.4 C) 99.8 F (37.7 C) 100.5 F (38.1 C)  TempSrc: Rectal Rectal Rectal Rectal  Resp: 40 20 18 33  Height:      Weight:      SpO2:  86% 93% 94%    Intake/Output Summary (Last 24 hours) at 05/02/2015 1056 Last data filed at 05/05/2015 0216  Gross per 24 hour  Intake   3000 ml  Output    125 ml  Net   2875 ml   General: lying in bed, in no acute distress, minimally alert, somnolent, but opens both eyes during exam CV: tachycardic, heart sounds hard to assess due to transmitted upper airway noise Lungs: anterior lung fields with transmitted upper airway nose, posterior lung fields hard to assess due to patient's mental status Abdomen: +BS, non tender, non distended  Pulses:  2+ throughout  Skin: mildly diaphoretic, warm  Extremities: cool LE, no edema, no erythema Neuro: not alert, somnolent but responded to exam by opening both eyes, 1+ reflexes bilaterally  Lab Results: Basic Metabolic Panel:  Recent Labs  96/29/52 0523 05/30/2015 0936  NA 175* 172*  K 3.4* 3.5  CL >130* >130*  CO2 15* 15*  GLUCOSE 62* 51*  BUN 34* 36*  CREATININE 2.26* 2.54*  CALCIUM 7.9* 7.9*   Liver Function Tests:  Recent Labs  05/19/2015 2024 05/02/2015 0523  AST 122* 71*  ALT 92* 60*  ALKPHOS 77 52  BILITOT 2.5* 2.3*  PROT 5.6* 4.2*  ALBUMIN 3.0* 2.2*    Recent Labs  05/25/2015 0523  LIPASE 15*    CBC:  Recent Labs  2015/05/19 2024  WBC 5.1  NEUTROABS 4.4  HGB 15.9*  HCT 54.1*  MCV 101.1*  PLT 59*   Cardiac Enzymes:  Recent Labs  05/29/2015 0523  CKTOTAL 258*   Urinalysis:  Recent Labs  05/19/15 2230    COLORURINE AMBER*  LABSPEC 1.023  PHURINE 5.0  GLUCOSEU NEGATIVE  HGBUR LARGE*  BILIRUBINUR NEGATIVE  KETONESUR NEGATIVE  PROTEINUR 30*  UROBILINOGEN 0.2  NITRITE POSITIVE*  LEUKOCYTESUR LARGE*   Micro Results: Recent Results (from the past 240 hour(s))  Clostridium Difficile by PCR (not at [facility])     Status: None   Collection Time: 05/15/2015  9:00 AM  Result Value Ref Range Status   C difficile by pcr NEGATIVE NEGATIVE Final  MRSA PCR Screening     Status: None   Collection Time: 05/28/2015  9:18 AM  Result Value Ref Range Status   MRSA by PCR NEGATIVE NEGATIVE Final    Comment:        The GeneXpert MRSA Assay (FDA approved for NASAL specimens only), is one component of a comprehensive MRSA colonization surveillance program. It is not intended to diagnose MRSA infection nor to guide or monitor treatment for MRSA infections.    Studies/Results: Ct Head Wo Contrast  05/09/2015   EXAM: CT HEAD WITHOUT CONTRAST  CT NECK WITHOUT CONTRAST  TECHNIQUE: Contiguous axial images were obtained from the base of the skull through the vertex without contrast. Multidetector CT imaging of the neck was performed using the standard protocol without intravenous  contrast.  COMPARISON:  Prior CT from 12/18/2014  FINDINGS: CT HEAD FINDINGS  Patient is rotated in the gantry, somewhat limiting evaluation.  Diffuse prominence of the CSF containing spaces is compatible with generalized age-related cerebral atrophy. Extensive small vessel changes present within the periventricular deep white matter. Probable small remote cortical infarct present within the high left frontal lobe anteriorly.  No acute large vessel territory infarct. No acute intracranial hemorrhage. No mass lesion, midline shift, or mass effect. Ventricular prominence related to global parenchymal volume loss present without hydrocephalus.  Scalp soft tissues within normal limits. No acute abnormality about the orbits.  Calvarium intact. Few  opacified right mass frontal air cells noted. There is scattered opacity within the ethmoidal air cells. Minimal layering opacity within the sphenoid sinuses and left maxillary sinus.  CT NECK FINDINGS  Patient is rotated in the gantry, somewhat limiting evaluation.  Small mild layering opacity present within the left maxillary sinus.  The salivary glands including the parotid glands and submandibular glands are within normal limits. Oral cavity is normal. Palatine tonsils within normal limits. Parapharyngeal fat is preserved. Oropharynx and nasopharynx within normal limits. No retropharyngeal fluid collection. Hypopharynx and supraglottic larynx are normal. Epiglottis normal. Vallecula is clear. True vocal cords are symmetric and normal bilaterally. Subglottic airway is clear.  Subcentimeter hypodense nodule noted within the right lobe of thyroid, of doubtful clinical significance. No pathologically enlarged lymph nodes within the neck. No supraclavicular adenopathy.  Visualized superior mediastinum within normal limits.  Prominent atheromatous plaque present within the aortic arch. Atheromatous disease present about the carotid bifurcations as well.  Small layering bilateral pleural effusions noted. Scattered parenchymal opacities present within the partially visualized upper lobes as well as the superior segment of the left lower lobe, suspicious for possible pneumonia.  Advanced multilevel degenerative disc disease within the visualized cervical spine. No acute osseous abnormality. No worrisome lytic or blastic osseous lesions. Heterogeneous calcification adjacent to the right first rib noted, which may be related to remote trauma.  IMPRESSION: CT HEAD IMPRESSION:  1. No acute intracranial process. 2. Advanced age-related cerebral atrophy with chronic microvascular ischemic disease. 3. Mild left maxillary and bilateral sphenoid sinus disease.  CT NECK IMPRESSION:  1. No acute abnormality identified within the  neck. No inflammatory process or adenopathy. 2. Patchy multi focal opacities within the partially visualized lungs, suspicious for possible multi focal pneumonia. 3. Small layering bilateral pleural effusions, incompletely visualized.   Electronically Signed   By: Rise Mu M.D.   On: 2015-06-09 03:10   Ct Soft Tissue Neck Wo Contrast  Jun 09, 2015   EXAM: CT HEAD WITHOUT CONTRAST  CT NECK WITHOUT CONTRAST  TECHNIQUE: Contiguous axial images were obtained from the base of the skull through the vertex without contrast. Multidetector CT imaging of the neck was performed using the standard protocol without intravenous contrast.  COMPARISON:  Prior CT from 12/18/2014  FINDINGS: CT HEAD FINDINGS  Patient is rotated in the gantry, somewhat limiting evaluation.  Diffuse prominence of the CSF containing spaces is compatible with generalized age-related cerebral atrophy. Extensive small vessel changes present within the periventricular deep white matter. Probable small remote cortical infarct present within the high left frontal lobe anteriorly.  No acute large vessel territory infarct. No acute intracranial hemorrhage. No mass lesion, midline shift, or mass effect. Ventricular prominence related to global parenchymal volume loss present without hydrocephalus.  Scalp soft tissues within normal limits. No acute abnormality about the orbits.  Calvarium intact. Few opacified right mass frontal  air cells noted. There is scattered opacity within the ethmoidal air cells. Minimal layering opacity within the sphenoid sinuses and left maxillary sinus.  CT NECK FINDINGS  Patient is rotated in the gantry, somewhat limiting evaluation.  Small mild layering opacity present within the left maxillary sinus.  The salivary glands including the parotid glands and submandibular glands are within normal limits. Oral cavity is normal. Palatine tonsils within normal limits. Parapharyngeal fat is preserved. Oropharynx and nasopharynx  within normal limits. No retropharyngeal fluid collection. Hypopharynx and supraglottic larynx are normal. Epiglottis normal. Vallecula is clear. True vocal cords are symmetric and normal bilaterally. Subglottic airway is clear.  Subcentimeter hypodense nodule noted within the right lobe of thyroid, of doubtful clinical significance. No pathologically enlarged lymph nodes within the neck. No supraclavicular adenopathy.  Visualized superior mediastinum within normal limits.  Prominent atheromatous plaque present within the aortic arch. Atheromatous disease present about the carotid bifurcations as well.  Small layering bilateral pleural effusions noted. Scattered parenchymal opacities present within the partially visualized upper lobes as well as the superior segment of the left lower lobe, suspicious for possible pneumonia.  Advanced multilevel degenerative disc disease within the visualized cervical spine. No acute osseous abnormality. No worrisome lytic or blastic osseous lesions. Heterogeneous calcification adjacent to the right first rib noted, which may be related to remote trauma.  IMPRESSION: CT HEAD IMPRESSION:  1. No acute intracranial process. 2. Advanced age-related cerebral atrophy with chronic microvascular ischemic disease. 3. Mild left maxillary and bilateral sphenoid sinus disease.  CT NECK IMPRESSION:  1. No acute abnormality identified within the neck. No inflammatory process or adenopathy. 2. Patchy multi focal opacities within the partially visualized lungs, suspicious for possible multi focal pneumonia. 3. Small layering bilateral pleural effusions, incompletely visualized.   Electronically Signed   By: Rise Mu M.D.   On: 05/21/15 03:10   Dg Chest Port 1 View  05/14/2015   CLINICAL DATA:  Possible sepsis, hypertension, altered mental status  EXAM: PORTABLE CHEST - 1 VIEW  COMPARISON:  12/18/2014  FINDINGS: Cardiomediastinal silhouette is stable. Mild perihilar interstitial  prominence without convincing pulmonary edema. There is streaky bilateral basilar atelectasis or infiltrate.  IMPRESSION: No convincing pulmonary edema. Streaky bilateral basilar atelectasis or infiltrate.   Electronically Signed   By: Natasha Mead M.D.   On: 05/21/2015 19:24   US Abdomen Limited Ruq  2015-05-21   CLINICAL DATA:  RIGHT upper quadrant pain. Previous cholecystectomy. Hypertension. Dementia. Altered mental status.  EXAM: US ABDOMEN LIMITED - RIGHT UPPER QUADRANT  COMPARISON:  None.  FINDINGS: Gallbladder:  Cholecystectomy.  Common bile duct:  Diameter: 4 mm.  Normal.  Liver:  Suboptimally visualized. No intrahepatic biliary ductal dilation. No mass lesion.  IMPRESSION: Cholecystectomy.  No acute abnormality.   Electronically Signed   By: Andreas Newport M.D.   On: 2015/05/21 07:30   Medications: I have reviewed the patient's current medications. Scheduled Meds: . budesonide  0.5 mg Nebulization BID  . hydrocortisone sod succinate (SOLU-CORTEF) inj  25 mg Intravenous Q8H  . memantine  10 mg Oral BID  . montelukast  10 mg Oral QHS  . piperacillin-tazobactam (ZOSYN)  IV  2.25 g Intravenous 4 times per day  . sodium chloride  250 mL Intravenous Once  . sodium chloride  500 mL Intravenous Once  . sodium chloride  3 mL Intravenous Q12H  . vancomycin  750 mg Intravenous Q24H   Continuous Infusions: . sodium chloride     PRN Meds:.acetaminophen, albuterol Assessment/Plan:  Principal Problem:   Sepsis due to urinary tract infection Active Problems:   Altered mental status   Hypernatremia   AKI (acute kidney injury)   Thrombocytopenia  [name] is a 81 yo F with a h/o HTN, dementia, and asthma who presents with AMS. She was found to be febrile, hypernatremic to 180, UA significant for URI with too numerous to count WBC and pos nitrite. WBC 5.1. CT scan negative for acute hemorrhage or stroke. CXR is unremarkable. She is being admitted for treatment of urosepsis and correction of  hypernatremia.    #Urosepsis Patient presented with AMS. In the ED she was found to be febrile to 102.2 F. UTI significant for too many to count WBC, positive nitrites, significant for UTI. WBC is at 5.1 with a neutrophil predominance. CXR neg for PNA. She is s/p 3.2 L bolus since yesterday.Marland Kitchen She was started on vanc/zosyn in the ED. Lactic acid has been trending up and is up to 10.3  - patient remains hypotensive, febrile, critically ill  - patient is DNR, no pressors, no ICU, no drastic measures, ok with abx and hydration  - cnt Vanc/zosyn - d/c 1/2 NS - IVF NS@ 200   # Hypernatremia 2/2 Dehydration Severe hypernatremia in setting of chronic dehydration in setting of decreased PO intake 2/2 dysphagia and active infection. Correct Na at a max rate of 10 mEq/L per 24 hours. Na is currently trending down to 172 most recently  - dc'ed 1/2 NS - IVFs NS @200  - BMET q4h - cnt to mnt Na   #AMS Patient with dementia at baseline, but with acute mental status change. Most likely 2/2 hypernatremia and urosepsis. Head CT in ED negative for acute changes.  - cnt treatment of urosepsis and hypernatremia  #Thrombocytopenia Plts at 59. Most likely 2/2 urosepsis. Had low plts at 56 during her last hospitalization for sepsis on 12/23/2014.   - SCDs  #AKI SCr at 2.24. Baseline is at 1.2. Likely pre-renal etiology in setting of sepsis, severe dehydration - cnt hydration as per above  # Mildly elevate transaminases LFTs significant for AST 122, ALT 92. Likely elevation in setting of hypoperfusion 2/2 sepsis.  - cnt hydration and abx as per above  # Aspiration Patient had vomiting and aspirated in the ED. Might develop aspiration penumonia. CXR was negative for infiltrate. Aspiration sequelae visualized on CT scan.  - cnt to monitor respiratory status - consider repeating CXR s/p IVF  # Diastolic CHF Mild diastolic dysfunction dx on ECHO in 2010. Dry on exam.   # Asthma On prednisone 10mg  qd x 1 year at  home.  - stress dose steroid solucortef 50 mg x 1 - cont pulmicort, singulair albuterol nebs   # Dementia Recognizes daugher and son and depends on them for ADLs - cnt Namedna 10 mg daily  # FEN/GI - NS @ 200  - replete electrolytes as needed, max rate of Na correction 10 mEq/ 24 hours - NPO  # DVT ppx - SCDs  # Dispo  Stepdown   # Code DNR   This is a Psychologist, occupational Note.  The care of the patient was discussed with Dr. Delane Ginger and the assessment and plan formulated with their assistance.  Please see their attached note for official documentation of the daily encounter.   LOS: 0 days   [employer], Med Student 2015/06/12, 10:56 AM

## 2015-06-01 NOTE — Progress Notes (Signed)
Teaching service at bedside. Informed daughter that her mom's condition has changed and to call the immediate family.  B/p 58/40 p-108 ; 02 saturation 84% 02 Nonrebreather mask.

## 2015-06-01 NOTE — Progress Notes (Addendum)
In room on rounds, noted asytole on the monitor and not breathing and no heartbeat present. Verified as above with Deberah Castle, RN. Dr.  Johna Roles notified and ok for 2 nurses to verify.  Notified CCMD. Family not present but made aware when they returned to floor. Pronounced at 7:15.

## 2015-06-01 NOTE — Progress Notes (Signed)
Notified that patient was in respiratory distress and sats had dropped to the 80s, SBP ~50s.  The IM residents went to assess the pt and she appeared in no distress and was placed on NRB with sats in the 90s.  Portable CXR showed increasing bibasilar infiltrates and effusions.  Since her SBP was in the 50s, we felt lasix was not the best option at this time.  We will give her morphine 0.5-1mg  q4h PRN for dyspnea and continue NS at 113ml/hr.  The RN expressed some concern that the phlebotomist was having a difficult time drawing labs.  I spoke with the daughter who is trying to decide if she wants to continue with lab draws but she is unsure at this point.  I told her she had time to decide as I understand this is a difficult decision.  In the meantime, I will ask the chaplain to drop by for additional support.

## 2015-06-01 NOTE — Progress Notes (Signed)
Notified Dr. Tasia Catchings from teaching service of critical lab values Na+ 172, Cl >130. Returned call and asked that I page Darien Ramus, medical student,  at [phone]. Jesly Hartmann, Student-RN

## 2015-06-01 NOTE — ED Notes (Signed)
Admitting team at bedside.

## 2015-06-01 NOTE — Progress Notes (Signed)
In room resp rate 42; 02 saturation 76%.Placed on nonrebreather mask at 15 liters/min. o2 saturation saturation up to 80%. Lungs sound congested. Teaching service  Dr. Delane Ginger notified and  [facility] called. Resp therapy at bedside for suctioning. B/p 70/32; hr 106

## 2015-06-01 NOTE — Progress Notes (Signed)
Chaplain notified and in to see.

## 2015-06-01 NOTE — Progress Notes (Signed)
Utilization Review Completed.  

## 2015-06-01 NOTE — Progress Notes (Signed)
Pt's family is still currently at the bedside. They are expecting more people to stop by at this time. Advised that they can take as long as they need in the room.

## 2015-06-01 NOTE — Progress Notes (Signed)
Subjective:   Day of hospitalization: 0  Pt was transferred from telemetry to stepdown due to hypotension.  BP continues to drop, currently 63/29.  She was febrile overnight, Tmax 102.6.  HR 107.  Pt was not alert when we saw her this AM and her daughter was at the bedside.    Objective:   Vital signs in last 24 hours: Filed Vitals:   05/29/2015 0317 05/09/2015 0437 05/22/2015 0650 05/16/2015 0830  BP:  71/41 66/51 63/29   Pulse:  109 108 107  Temp: 102.3 F (39.1 C) 101.1 F (38.4 C) 99.8 F (37.7 C) 100.5 F (38.1 C)  TempSrc: Rectal Rectal Rectal Rectal  Resp: 40 20 18 33  Height:      Weight:      SpO2:  86% 93% 94%    Weight: Filed Weights   05/10/2015 0252  Weight: 48.8 kg (107 lb 9.4 oz)    I/Os:  Intake/Output Summary (Last 24 hours) at 05/15/2015 1006 Last data filed at 05/28/2015 0216  Gross per 24 hour  Intake   3000 ml  Output    125 ml  Net   2875 ml    Physical Exam: Constitutional: Vital signs reviewed.  Patient is lying in bed in no acute distress with [place].  She is not alert but opens her eyes occasionally.   Cardiovascular: tachycardic, distant heart sounds due to a lot of upper airway noise Pulmonary/Chest: diffuse rhonchi but assessment limited d/t pt's mental status  Abdominal: Soft. +BS, NT/ND Neurological: Not alert  Extremities: 2+DP b/l, no pitting edema Skin: Warm, dry and intact. Tenting observed.    Lab Results:  BMP:  Recent Labs Lab 05/23/2015 0105 05/08/2015 0523  NA 177* 175*  K 4.1 3.4*  CL >130* >130*  CO2 16* 15*  GLUCOSE 102* 62*  BUN 34* 34*  CREATININE 2.06* 2.26*  CALCIUM 8.2* 7.9*    CBC:  Recent Labs Lab June 04, 2015 2024  WBC 5.1  NEUTROABS 4.4  HGB 15.9*  HCT 54.1*  MCV 101.1*  PLT 59*    Coagulation: No results for input(s): LABPROT, INR in the last 168 hours.  CBG:           No results for input(s): GLUCAP in the last 168 hours.         HA1C:      No results for input(s): HGBA1C in the last 168  hours.  Lipid Panel: No results for input(s): CHOL, HDL, LDLCALC, TRIG, CHOLHDL, LDLDIRECT in the last 168 hours.  LFTs:  Recent Labs Lab 2015/06/04 2024 05/02/2015 0523  AST 122* 71*  ALT 92* 60*  ALKPHOS 77 52  BILITOT 2.5* 2.3*  PROT 5.6* 4.2*  ALBUMIN 3.0* 2.2*    Pancreatic Enzymes:  Recent Labs Lab 05/31/2015 0523  LIPASE 15*    Lactic Acid/Procalcitonin:  Recent Labs Lab Jun 04, 2015 2029 June 04, 2015 2230 05/31/2015 0523  LATICACIDVEN 7.32* 7.95* 10.3*    Ammonia: No results for input(s): AMMONIA in the last 168 hours.  Cardiac Enzymes:  Recent Labs Lab 05/04/2015 0523  CKTOTAL 258*    EKG: EKG Interpretation  Date/Time:  Thursday 2015-06-04 19:08:41 EDT Ventricular Rate:  109 PR Interval:  109 QRS Duration: 64 QT Interval:  328 QTC Calculation: 442 R Axis:   37 Text Interpretation:  Sinus tachycardia Borderline ST depression, diffuse leads Abnormal T, consider ischemia, diffuse leads Confirmed by Ethelda Chick  MD, SAM ([id number]) on 06/04/15 8:00:03 PM   BNP: No results for input(s): PROBNP  in the last 168 hours.  D-Dimer: No results for input(s): DDIMER in the last 168 hours.  Urinalysis:  Recent Labs Lab 05/16/2015 2230  COLORURINE AMBER*  LABSPEC 1.023  PHURINE 5.0  GLUCOSEU NEGATIVE  HGBUR LARGE*  BILIRUBINUR NEGATIVE  KETONESUR NEGATIVE  PROTEINUR 30*  UROBILINOGEN 0.2  NITRITE POSITIVE*  LEUKOCYTESUR LARGE*    Micro Results: No results found for this or any previous visit (from the past 240 hour(s)).  Blood Culture:    Component Value Date/Time   SDES BLOOD HAND LEFT 12/18/2014 1125   SPECREQUEST BOTTLES DRAWN AEROBIC AND ANAEROBIC 2.5CC 12/18/2014 1125   CULT  12/18/2014 1125    VEILLONELLA SPECIES Note: Gram Stain Report Called to,Read Back By and Verified With: NICKY POTTER 12/22/14 0955 BY SMITHERSJ Performed at [facility]    REPTSTATUS 12/22/2014 FINAL 12/18/2014 1125    Studies/Results: Ct Head Wo  Contrast  08-Jun-2015   EXAM: CT HEAD WITHOUT CONTRAST  CT NECK WITHOUT CONTRAST  TECHNIQUE: Contiguous axial images were obtained from the base of the skull through the vertex without contrast. Multidetector CT imaging of the neck was performed using the standard protocol without intravenous contrast.  COMPARISON:  Prior CT from 12/18/2014  FINDINGS: CT HEAD FINDINGS  Patient is rotated in the gantry, somewhat limiting evaluation.  Diffuse prominence of the CSF containing spaces is compatible with generalized age-related cerebral atrophy. Extensive small vessel changes present within the periventricular deep white matter. Probable small remote cortical infarct present within the high left frontal lobe anteriorly.  No acute large vessel territory infarct. No acute intracranial hemorrhage. No mass lesion, midline shift, or mass effect. Ventricular prominence related to global parenchymal volume loss present without hydrocephalus.  Scalp soft tissues within normal limits. No acute abnormality about the orbits.  Calvarium intact. Few opacified right mass frontal air cells noted. There is scattered opacity within the ethmoidal air cells. Minimal layering opacity within the sphenoid sinuses and left maxillary sinus.  CT NECK FINDINGS  Patient is rotated in the gantry, somewhat limiting evaluation.  Small mild layering opacity present within the left maxillary sinus.  The salivary glands including the parotid glands and submandibular glands are within normal limits. Oral cavity is normal. Palatine tonsils within normal limits. Parapharyngeal fat is preserved. Oropharynx and nasopharynx within normal limits. No retropharyngeal fluid collection. Hypopharynx and supraglottic larynx are normal. Epiglottis normal. Vallecula is clear. True vocal cords are symmetric and normal bilaterally. Subglottic airway is clear.  Subcentimeter hypodense nodule noted within the right lobe of thyroid, of doubtful clinical significance. No  pathologically enlarged lymph nodes within the neck. No supraclavicular adenopathy.  Visualized superior mediastinum within normal limits.  Prominent atheromatous plaque present within the aortic arch. Atheromatous disease present about the carotid bifurcations as well.  Small layering bilateral pleural effusions noted. Scattered parenchymal opacities present within the partially visualized upper lobes as well as the superior segment of the left lower lobe, suspicious for possible pneumonia.  Advanced multilevel degenerative disc disease within the visualized cervical spine. No acute osseous abnormality. No worrisome lytic or blastic osseous lesions. Heterogeneous calcification adjacent to the right first rib noted, which may be related to remote trauma.  IMPRESSION: CT HEAD IMPRESSION:  1. No acute intracranial process. 2. Advanced age-related cerebral atrophy with chronic microvascular ischemic disease. 3. Mild left maxillary and bilateral sphenoid sinus disease.  CT NECK IMPRESSION:  1. No acute abnormality identified within the neck. No inflammatory process or adenopathy. 2. Patchy multi focal opacities within the  partially visualized lungs, suspicious for possible multi focal pneumonia. 3. Small layering bilateral pleural effusions, incompletely visualized.   Electronically Signed   By: Rise Mu M.D.   On: 05/02/2015 03:10   Ct Soft Tissue Neck Wo Contrast  05/22/2015   EXAM: CT HEAD WITHOUT CONTRAST  CT NECK WITHOUT CONTRAST  TECHNIQUE: Contiguous axial images were obtained from the base of the skull through the vertex without contrast. Multidetector CT imaging of the neck was performed using the standard protocol without intravenous contrast.  COMPARISON:  Prior CT from 12/18/2014  FINDINGS: CT HEAD FINDINGS  Patient is rotated in the gantry, somewhat limiting evaluation.  Diffuse prominence of the CSF containing spaces is compatible with generalized age-related cerebral atrophy. Extensive  small vessel changes present within the periventricular deep white matter. Probable small remote cortical infarct present within the high left frontal lobe anteriorly.  No acute large vessel territory infarct. No acute intracranial hemorrhage. No mass lesion, midline shift, or mass effect. Ventricular prominence related to global parenchymal volume loss present without hydrocephalus.  Scalp soft tissues within normal limits. No acute abnormality about the orbits.  Calvarium intact. Few opacified right mass frontal air cells noted. There is scattered opacity within the ethmoidal air cells. Minimal layering opacity within the sphenoid sinuses and left maxillary sinus.  CT NECK FINDINGS  Patient is rotated in the gantry, somewhat limiting evaluation.  Small mild layering opacity present within the left maxillary sinus.  The salivary glands including the parotid glands and submandibular glands are within normal limits. Oral cavity is normal. Palatine tonsils within normal limits. Parapharyngeal fat is preserved. Oropharynx and nasopharynx within normal limits. No retropharyngeal fluid collection. Hypopharynx and supraglottic larynx are normal. Epiglottis normal. Vallecula is clear. True vocal cords are symmetric and normal bilaterally. Subglottic airway is clear.  Subcentimeter hypodense nodule noted within the right lobe of thyroid, of doubtful clinical significance. No pathologically enlarged lymph nodes within the neck. No supraclavicular adenopathy.  Visualized superior mediastinum within normal limits.  Prominent atheromatous plaque present within the aortic arch. Atheromatous disease present about the carotid bifurcations as well.  Small layering bilateral pleural effusions noted. Scattered parenchymal opacities present within the partially visualized upper lobes as well as the superior segment of the left lower lobe, suspicious for possible pneumonia.  Advanced multilevel degenerative disc disease within the  visualized cervical spine. No acute osseous abnormality. No worrisome lytic or blastic osseous lesions. Heterogeneous calcification adjacent to the right first rib noted, which may be related to remote trauma.  IMPRESSION: CT HEAD IMPRESSION:  1. No acute intracranial process. 2. Advanced age-related cerebral atrophy with chronic microvascular ischemic disease. 3. Mild left maxillary and bilateral sphenoid sinus disease.  CT NECK IMPRESSION:  1. No acute abnormality identified within the neck. No inflammatory process or adenopathy. 2. Patchy multi focal opacities within the partially visualized lungs, suspicious for possible multi focal pneumonia. 3. Small layering bilateral pleural effusions, incompletely visualized.   Electronically Signed   By: Rise Mu M.D.   On: 05/03/2015 03:10   Dg Chest Port 1 View  06/08/2015   CLINICAL DATA:  Possible sepsis, hypertension, altered mental status  EXAM: PORTABLE CHEST - 1 VIEW  COMPARISON:  12/18/2014  FINDINGS: Cardiomediastinal silhouette is stable. Mild perihilar interstitial prominence without convincing pulmonary edema. There is streaky bilateral basilar atelectasis or infiltrate.  IMPRESSION: No convincing pulmonary edema. Streaky bilateral basilar atelectasis or infiltrate.   Electronically Signed   By: Natasha Mead M.D.   On: Jun 08, 2015  19:24   US Abdomen Limited Ruq  05/10/2015   CLINICAL DATA:  RIGHT upper quadrant pain. Previous cholecystectomy. Hypertension. Dementia. Altered mental status.  EXAM: US ABDOMEN LIMITED - RIGHT UPPER QUADRANT  COMPARISON:  None.  FINDINGS: Gallbladder:  Cholecystectomy.  Common bile duct:  Diameter: 4 mm.  Normal.  Liver:  Suboptimally visualized. No intrahepatic biliary ductal dilation. No mass lesion.  IMPRESSION: Cholecystectomy.  No acute abnormality.   Electronically Signed   By: Andreas Newport M.D.   On: 05/10/2015 07:30    Medications:  Scheduled Meds: . budesonide  0.5 mg Nebulization BID  .  hydrocortisone sod succinate (SOLU-CORTEF) inj  25 mg Intravenous Q8H  . memantine  10 mg Oral BID  . montelukast  10 mg Oral QHS  . piperacillin-tazobactam (ZOSYN)  IV  2.25 g Intravenous 4 times per day  . sodium chloride  250 mL Intravenous Once  . sodium chloride  500 mL Intravenous Once  . sodium chloride  3 mL Intravenous Q12H  . vancomycin  750 mg Intravenous Q24H   Continuous Infusions: . sodium chloride     PRN Meds: acetaminophen, albuterol  Antibiotics: Antibiotics Given (last 72 hours)    None      Day of Hospitalization: 0  Consults:    Assessment/Plan:   Principal Problem:   Sepsis due to urinary tract infection Active Problems:   Altered mental status   Hypernatremia   AKI (acute kidney injury)   Thrombocytopenia  Sepsis due to urinary tract infection Pt not alert today and VS are worsening.  She was given a 2L bolus in the ED and was started on 1/2NS with worsening VS.  She appears volume depleted on exam and according to her daughter has decreased intake recently and a UTI on UA.  She was started on vanc/zosyn in ED.  Lactic acid has increased since admission and is now up to 10.  Pt does not have a good prognosis at this point and is DNR so will not be transferred to ICU.  No pressors or other heroic measures.  Pt also hypernatremic which we are slowly correcting.   -d/c 1/2NS -start NS@150m /h -cont vanc/zosyn -cont to monitor lactic acid and sodium closely   Altered mental status Likely due to above.  Hypernatremia Pt initially given 2L NS bolus in the ED and was started on 1/2NS.  She appears volume depleted with hypotension. -d/c 1/2NS -start NS 181ml/h -cont to monitor sodium   AKI (acute kidney injury) -per above  Thrombocytopenia Likely d/t sepsis.    -SCDs -monitor cbc  F/E/N Fluids- NS 173ml/h Electrolytes- Replete as needed  Nutrition- NPO  VTE PPx  SCDs   Disposition Disposition is deferred, awaiting improvement of  current medical problems.  Anticipated discharge in approximately 1-2 day(s).    Marrian Salvage, MD PGY-2, Internal Medicine Teaching Service 05/22/2015, 10:06 AM

## 2015-06-01 NOTE — Progress Notes (Signed)
Patient transferred to 2c room 7. Pt. Settled into room. Night shift RN called down prior and gave report.

## 2015-06-01 NOTE — Progress Notes (Signed)
RN called RT to room. Pt on NRB SPO2 79%.  NTS pt received moderated pink, tan thick secretions.  MD in room.  Pt is comfort care.  RT to monitor.  SPO2 94% on NRB

## 2015-06-01 NOTE — Progress Notes (Signed)
Return call from [place], medical student, regarding elevated chloride and sodium. No orders given at this time. [name], Student-RN

## 2015-06-01 NOTE — Progress Notes (Signed)
Patient ID: [name], female   DOB: [birth year], 81 y.o.   MRN: [id number] Medicine attending: Nurse alerted [place] patient's oxygen saturation falling down to 86% on 4 L nasal cannula. I reevaluated the patient to together with the medical team. Blood pressure remains low at 66/51 despite aggressive IV fluid administration. Nonrebreather oxygen mask placed. Transient rise in oxygen saturation to 94% now falling again. She remains poorly responsive but arousable. No increased work of breathing. No jugular venous distention. Persistent upper airway noise over the trachea. Anterior lungs overall clear. Regular cardiac rhythm. No S3 gallop. Chest x-ray done at bedside which I reviewed  shows early vascular congestion. Impression: Urosepsis. I think her hypoxia is primarily from poor organ perfusion secondary to sepsis and hypotension and not fluid overload. In the absence of pressor agents, I don't see any alternative other than to maintain high fluid volume in attempt to maintain blood pressure. Patient's daughter is present. I told her that I thought we were reaching the limit of what we can do medically. I reassured her we would do everything we can to keep her mother comfortable but I was not sure that she would be able to survive this insult. She is very understanding. Her brother is en route to the hospital. If the patient develops respiratory distress, I would give small doses of morphine 1-2 milligrams IV every 4 hours as needed to palliate her breathing.

## 2015-06-01 NOTE — Progress Notes (Signed)
Chaplain respond to consult    81 yo patient is declining due to sepsis from a urinary tract infection. Daughter needed support.  Chaplain spoke with daughter, nurses and celebrated life stories about mother and their childhood experiences. Brother did arrive later.    We explored faith and belief issues reflecting upon the connections around her current circumstance. They have found strength with their faith that whatever, happens God will take care of them and their mother.  Chaplain help the children identify with their emotions and expressed the freedom to let them out.  The daughter also told me that she would be contacting the pastor once she felt the family and the patient were ready    05/28/2015 1600  Clinical Encounter Type  Visited With Patient and family together;Health care provider  Visit Type Initial;Spiritual support  Referral From Chaplain;Nurse  Consult/Referral To Chaplain;Nurse  Spiritual Encounters  Spiritual Needs Emotional   emotional.

## 2015-06-01 NOTE — Progress Notes (Signed)
Teaching service at bedside.  

## 2015-06-01 DEATH — deceased
